# Patient Record
Sex: Female | Born: 1990 | Race: White | Hispanic: No | Marital: Married | Smoking: Current some day smoker
Health system: Southern US, Community
[De-identification: ages and names within clinical notes are randomized; demographics above are authoritative.]

## PROBLEM LIST (undated history)

## (undated) ENCOUNTER — Inpatient Hospital Stay (HOSPITAL_COMMUNITY): Payer: Self-pay

## (undated) DIAGNOSIS — A4902 Methicillin resistant Staphylococcus aureus infection, unspecified site: Secondary | ICD-10-CM

## (undated) DIAGNOSIS — F419 Anxiety disorder, unspecified: Secondary | ICD-10-CM

## (undated) DIAGNOSIS — K529 Noninfective gastroenteritis and colitis, unspecified: Secondary | ICD-10-CM

## (undated) DIAGNOSIS — F329 Major depressive disorder, single episode, unspecified: Secondary | ICD-10-CM

## (undated) DIAGNOSIS — K5090 Crohn's disease, unspecified, without complications: Secondary | ICD-10-CM

## (undated) DIAGNOSIS — G062 Extradural and subdural abscess, unspecified: Secondary | ICD-10-CM

## (undated) DIAGNOSIS — F1120 Opioid dependence, uncomplicated: Secondary | ICD-10-CM

## (undated) DIAGNOSIS — F32A Depression, unspecified: Secondary | ICD-10-CM

## (undated) DIAGNOSIS — R51 Headache: Secondary | ICD-10-CM

## (undated) HISTORY — DX: Anxiety disorder, unspecified: F41.9

## (undated) HISTORY — DX: Crohn's disease, unspecified, without complications: K50.90

## (undated) HISTORY — PX: HAND SURGERY: SHX662

## (undated) HISTORY — DX: Major depressive disorder, single episode, unspecified: F32.9

## (undated) HISTORY — PX: COLONOSCOPY: SHX174

## (undated) HISTORY — DX: Depression, unspecified: F32.A

## (undated) HISTORY — PX: TONSILLECTOMY: SUR1361

## (undated) HISTORY — DX: Noninfective gastroenteritis and colitis, unspecified: K52.9

---
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
## 2009-06-21 ENCOUNTER — Encounter (INDEPENDENT_AMBULATORY_CARE_PROVIDER_SITE_OTHER): Payer: Self-pay | Admitting: *Deleted

## 2009-06-29 ENCOUNTER — Telehealth: Payer: Self-pay | Admitting: Gastroenterology

## 2009-06-30 ENCOUNTER — Ambulatory Visit: Payer: Self-pay | Admitting: Internal Medicine

## 2009-06-30 DIAGNOSIS — F329 Major depressive disorder, single episode, unspecified: Secondary | ICD-10-CM | POA: Insufficient documentation

## 2009-06-30 DIAGNOSIS — F411 Generalized anxiety disorder: Secondary | ICD-10-CM | POA: Insufficient documentation

## 2009-07-15 ENCOUNTER — Telehealth (INDEPENDENT_AMBULATORY_CARE_PROVIDER_SITE_OTHER): Payer: Self-pay | Admitting: *Deleted

## 2009-08-09 ENCOUNTER — Encounter: Payer: Self-pay | Admitting: Physician Assistant

## 2010-05-26 NOTE — Miscellaneous (Signed)
Summary: RX Lorazepam  Clinical Lists Changes  Medications: Changed medication from ATIVAN 0.5 MG TABS (LORAZEPAM) take 1 tab twice daily to ATIVAN 0.5 MG TABS (LORAZEPAM) take 1 tab once daily - Signed Rx of ATIVAN 0.5 MG TABS (LORAZEPAM) take 1 tab once daily;  #30 x 1;  Signed;  Entered by: Lowry Ram NCMA;  Authorized by: Sammuel Cooper PA-c;  Method used: Printed then faxed to [facility] [phone]*, [address] [place], [place]  [address], Ph: [phone] or [phone], Fax: [phone]    Prescriptions: ATIVAN 0.5 MG TABS (LORAZEPAM) take 1 tab once daily  #30 x 1   Entered by:   Lowry Ram NCMA   Authorized by:   Sammuel Cooper PA-c   Signed by:   Lowry Ram NCMA on 08/09/2009   Method used:   Printed then faxed to ...       [facility] [address] (retail)       [address]       [place], [place]  [address]       Ph: [phone] or [phone]       Fax: [phone]   RxID:   [id number]

## 2010-05-26 NOTE — Letter (Signed)
Summary: New Patient letter  [facility]  [address] [place], [place] [address]   Phone: [phone]  Fax: [phone]       06/21/2009 MRN: [id number]  Lauren Allen [address] [place], [place]  [address]  Dear Lauren Allen,  Welcome to the Gastroenterology Division at [facility].    You are scheduled to see Dr.  Russella Dar on 07-19-09 at 9:30AM on the 3rd floor at [facility], [address]  We ask that you try to arrive at our office 15 minutes prior to your appointment time to allow for check-in.  We would like you to complete the enclosed self-administered evaluation form prior to your visit and bring it with you on the day of your appointment.  We will review it with you.  Also, please bring a complete list of all your medications or, if you prefer, bring the medication bottles and we will list them.  Please bring your insurance card so that we may make a copy of it.  If your insurance requires a referral to see a specialist, please bring your referral form from your primary care physician.  Co-payments are due at the time of your visit and may be paid by cash, check or credit card.     Your office visit will consist of a consult with your physician (includes a physical exam), any laboratory testing he/she may order, scheduling of any necessary diagnostic testing (e.g. x-ray, ultrasound, CT-scan), and scheduling of a procedure (e.g. Endoscopy, Colonoscopy) if required.  Please allow enough time on your schedule to allow for any/all of these possibilities.    If you cannot keep your appointment, please call [phone] to cancel or reschedule prior to your appointment date.  This allows [place] the opportunity to schedule an appointment for another patient in need of care.  If you do not cancel or reschedule by 5 p.m. the business day prior to your appointment date, you will be charged a $50.00 late cancellation/no-show fee.    Thank you for choosing  [facility] for your medical needs.  We appreciate the opportunity to care for you.  Please visit [place] at our website  to learn more about our practice.                     Sincerely,                                                             The Gastroenterology Division

## 2010-05-26 NOTE — Progress Notes (Signed)
Summary: Appt with Dr. Dawayne Cirri office  Phone Note Outgoing Call   Call placed by: Joselyn Glassman,  July 15, 2009 9:28 AM Call placed to: Specialist Summary of Call: Feliberto Harts, office manager at [facility], Dr. Dawayne Cirri office.  Need to know if pt made appt for herself as we recommended. Initial call taken by: Joselyn Glassman,  July 15, 2009 9:45 AM  Follow-up for Phone Call        Victorino Dike called me back and said the pt has not called yet for appointment.  Victorino Dike said she would be glad to call the pt's cell phone and try to make appt for her.   Follow-up by: Joselyn Glassman,  July 15, 2009 9:48 AM  Additional Follow-up for Phone Call Additional follow up Details #1::        Called Victorino Dike back and she said the pt told her she will call and make her appt when she gets her new work schedule soon. I also replyed to Dr. Marvell Fuller flag asking if this pt made an appt with that office. Sent myself a flag for 3-29 to call Victorino Dike back to see if thie pt made appt to see someone at that office. Additional Follow-up by: Joselyn Glassman,  July 15, 2009 1:58 PM

## 2010-05-26 NOTE — Progress Notes (Signed)
Summary: sooner appt.  Phone Note From Other Clinic   Caller: [facility]  [name]  [phone] Call For: Dr. Russella Dar Summary of Call: Pt has an appt. on 07-19-09 and wants a sooner appt. Has had GERD symptoms since Jan. and is continuing to constantly "belch" Initial call taken by: Karna Christmas,  June 29, 2009 2:50 PM  Follow-up for Phone Call         message left to call back   Teryl Lucy RN  June 29, 2009 3:50 PM  Patient  is rescheduled to today at 2:00.  [name] will notify the patient   Follow-up by: Darcey Nora RN, CGRN,  June 30, 2009 8:19 AM

## 2010-05-26 NOTE — Assessment & Plan Note (Signed)
Summary: BURPING ISSUES-EXCESSIVE/YF   History of Present Illness Visit Type: Initial Consult Primary GI MD: Stan Head MD [facility] Primary Provider: Benedetto Goad, MD Requesting Provider: Benedetto Goad, MD Chief Complaint: Excessive burping issue x 2 months- constant History of Present Illness:   20 Y.O. FEMALE NEW TO G.I. TODAY WITH HX OF CHRONIC ANXIETY,HEADACHES AND DEPRESSION. SHE IS ACCOMPANIED BY HER AUNT TODAY. SHE COMES IN WITH INTRACTABLE BELCHING,HAS BEEN HAVING SXS FOR SEVERAL MONTHS, WORSE OVER THE PAST 2 MONTHS.SHE BELCHES OFF AND ON ALL DAY, NOT NECCESSARILY BETTER OR WORSE WITH MEALS.NO DYSPAHAGIA, NO HEARTBURN, NO NAUSEA,VOMITING,NO WEIGHT LOSS,APPETITE OK. SHE HAS BEE GIVEN A TRIAL OF PRILOSEC TWICE DAILY X 3 WEEKS WITH NO CHANGE IN SXS. ALSO HAS HAD VALIUM 2 MG TWICE DAILY-DID CALM HER DOWN BUT DID NOT STOP BELCHING.HE AUNT RELATES  THAT SHE IS EXTREMELY ANXIOUS,HAS A BAD LIVING SITUATION,AND STRESSFUL WORK. PT IS LIVING WITH HER MOTHER,PAYING RENT-MOM IS BIPOLAR,VERY DEROGATORY TO PT,TREATS HER POORLY.   GI Review of Systems    Reports acid reflux, belching, bloating, nausea, vomiting, and  weight gain.      Denies abdominal pain, chest pain, dysphagia with liquids, dysphagia with solids, heartburn, loss of appetite, vomiting blood, and  weight loss.      Reports black tarry stools and  change in bowel habits.     Denies anal fissure, constipation, diarrhea, diverticulosis, fecal incontinence, heme positive stool, hemorrhoids, irritable bowel syndrome, jaundice, light color stool, liver problems, rectal bleeding, and  rectal pain. Preventive Screening-Counseling & Management  Alcohol-Tobacco     Smoking Status: current      Drug Use:  no.      Current Medications (verified): 1)  Gianvi 3-0.02 Mg Tabs (Drospirenone-Ethinyl Estradiol) .... Once Daily 2)  Citalopram Hydrobromide 20 Mg Tabs (Citalopram Hydrobromide) .... Take 1/2 Tablet By Mouth Once Daily  Allergies  (verified): 1)  ! Sulfa  Past History:  Past Medical History: Anxiety Disorder Chronic Headaches Depression  Past Surgical History: Tonsillectomy  Family History: Family History of Colon Cancer:GF Family History of Celiac Disease:Aunts Family History of Diabetes: GM Family History of Heart Disease:   Social History: Occupation:Hostess  Patient currently smokes.  Alcohol Use - no Daily Caffeine Use -3 Illicit Drug Use - no Smoking Status:  current Drug Use:  no  Review of Systems       The patient complains of anxiety-new, depression-new, headaches-new, menstrual pain, muscle pains/cramps, night sweats, and nosebleeds.  The patient denies allergy/sinus, anemia, arthritis/joint pain, back pain, blood in urine, breast changes/lumps, change in vision, confusion, cough, coughing up blood, fainting, fatigue, fever, hearing problems, heart murmur, heart rhythm changes, itching, pregnancy symptoms, shortness of breath, skin rash, sleeping problems, sore throat, swelling of feet/legs, swollen lymph glands, thirst - excessive , urination - excessive , urination changes/pain, urine leakage, vision changes, and voice change.         ROS OTHERWISE AS IN HPI  Vital Signs:  Patient profile:   20 year old female Height:      63 inches Weight:      181 pounds BMI:     32.18 Pulse rate:   80 / minute Pulse rhythm:   regular BP sitting:   116 / 80  (left arm) Cuff size:   regular  Vitals Entered By: June McMurray CMA Duncan Dull) (June 30, 2009 1:59 PM)  Physical Exam  General:  Well developed, well nourished, no acute distress. Head:  Normocephalic and atraumatic. Eyes:  PERRLA, no icterus.  Lungs:  Clear throughout to auscultation. Heart:  Regular rate and rhythm; no murmurs, rubs,  or bruits. Abdomen:  SOFT, NONTENDER, NO MASS OR HSM,BS+ Rectal:  NOT DONE Extremities:  No clubbing, cyanosis, edema or deformities noted. Neurologic:  Alert and  oriented x4;  grossly normal  neurologically. Psych:  Alert and cooperative. Normal mood and affect.anxious.     Impression & Recommendations:  Problem # 1:  REFRACTORY BELCHING Assessment New 20 YO WITH 2 MONTH HX OF REFRACTORY BELCHING,NO ALARM SIGNS-VERY LIKELY THIS IS PSYCHOGENIC SECONDARY TO UNDERLYING ANXIETY ISSUES  REVIWED TRIAL OF LOW GAS DIET,SLOW EATING STOP CARBONATED BEVERAGES INITATE SMOKIG CESSATION TRIAL OF ATIVAN 0.5 MG TWICE DAILY SCHEDULE APPT WITH BEHAVIORAL HEALTH/[place] FOR COUNSELLING. PT AND HER AUNT ARE RECEPTIVE TO THIS PLAN. FOLLOW UP WITH G.I. DR GESSNER AS NEEDED.  Patient Instructions: 1)  We have given you a perscription to take to your pharmacy. 2)  Excessive Gas Diet handout given.  3)  Drink no carbinated beverages. 4)  You can try Gas-X as needed. 5)  We have given you information  to call  for an appointment  with Behavioral Health for management of your anxiety. 6)  Copy sent to :  Benedetto Goad, MD 7)  The medication list was reviewed and reconciled.  All changed / newly prescribed medications were explained.  A complete medication list was provided to the patient / caregiver. Prescriptions: ATIVAN 0.5 MG TABS (LORAZEPAM) take 1 tab twice daily  #60 x 0   Entered by:   Lowry Ram NCMA   Authorized by:   Sammuel Cooper PA-c   Signed by:   Lowry Ram NCMA on 06/30/2009   Method used:   Printed then faxed to ...       [facility] [address] (retail)       [address]       [place], [place]  [address]       Ph: [phone] or [phone]       Fax: [phone]   RxID:   [id number]

## 2011-01-13 LAB — DIFFERENTIAL
Basophils Absolute: 0
Basophils Relative: 0
Eosinophils Absolute: 0.2
Eosinophils Relative: 2
Lymphocytes Relative: 28
Lymphs Abs: 2.2
Monocytes Absolute: 0.7
Monocytes Relative: 9
Neutro Abs: 4.8
Neutrophils Relative %: 61

## 2011-01-13 LAB — URINALYSIS, ROUTINE W REFLEX MICROSCOPIC
Bilirubin Urine: NEGATIVE
Glucose, UA: NEGATIVE
Hgb urine dipstick: NEGATIVE
Ketones, ur: NEGATIVE
Leukocytes, UA: NEGATIVE
Nitrite: NEGATIVE
Protein, ur: >300 — AB
Specific Gravity, Urine: 1.028
Urobilinogen, UA: 0.2
pH: 6.5

## 2011-01-13 LAB — CBC
HCT: 40.2
Hemoglobin: 14.1
MCHC: 35.1
MCV: 86
Platelets: 253
RBC: 4.67
RDW: 12.2
WBC: 8

## 2011-01-13 LAB — RAPID URINE DRUG SCREEN, HOSP PERFORMED
Amphetamines: POSITIVE — AB
Barbiturates: NOT DETECTED
Benzodiazepines: NOT DETECTED
Cocaine: NOT DETECTED
Opiates: NOT DETECTED
Tetrahydrocannabinol: NOT DETECTED

## 2011-01-13 LAB — BASIC METABOLIC PANEL
BUN: 16
CO2: 30
Calcium: 9.7
Chloride: 104
Creatinine, Ser: 0.72
Glucose, Bld: 100 — ABNORMAL HIGH
Potassium: 3.9
Sodium: 142

## 2011-01-13 LAB — URINE MICROSCOPIC-ADD ON

## 2011-01-13 LAB — PREGNANCY, URINE: Preg Test, Ur: NEGATIVE

## 2011-07-31 ENCOUNTER — Emergency Department (HOSPITAL_BASED_OUTPATIENT_CLINIC_OR_DEPARTMENT_OTHER)
Admission: EM | Admit: 2011-07-31 | Discharge: 2011-07-31 | Disposition: A | Discharge status: Home / Self Care | Payer: BC Managed Care – PPO | Attending: Emergency Medicine | Admitting: Emergency Medicine

## 2011-07-31 ENCOUNTER — Encounter (HOSPITAL_BASED_OUTPATIENT_CLINIC_OR_DEPARTMENT_OTHER): Payer: Self-pay | Admitting: *Deleted

## 2011-07-31 ENCOUNTER — Other Ambulatory Visit: Payer: Self-pay

## 2011-07-31 DIAGNOSIS — R1110 Vomiting, unspecified: Secondary | ICD-10-CM | POA: Insufficient documentation

## 2011-07-31 DIAGNOSIS — R55 Syncope and collapse: Secondary | ICD-10-CM

## 2011-07-31 DIAGNOSIS — N390 Urinary tract infection, site not specified: Secondary | ICD-10-CM | POA: Insufficient documentation

## 2011-07-31 DIAGNOSIS — D72829 Elevated white blood cell count, unspecified: Secondary | ICD-10-CM | POA: Insufficient documentation

## 2011-07-31 DIAGNOSIS — F17200 Nicotine dependence, unspecified, uncomplicated: Secondary | ICD-10-CM | POA: Insufficient documentation

## 2011-07-31 DIAGNOSIS — I498 Other specified cardiac arrhythmias: Secondary | ICD-10-CM | POA: Insufficient documentation

## 2011-07-31 DIAGNOSIS — R197 Diarrhea, unspecified: Secondary | ICD-10-CM | POA: Insufficient documentation

## 2011-07-31 HISTORY — DX: Headache: R51

## 2011-07-31 LAB — CBC
HCT: 38.1 % (ref 36.0–46.0)
Hemoglobin: 13.3 g/dL (ref 12.0–15.0)
MCH: 28.6 pg (ref 26.0–34.0)
MCHC: 34.9 g/dL (ref 30.0–36.0)
MCV: 81.9 fL (ref 78.0–100.0)
Platelets: 283 10*3/uL (ref 150–400)
RBC: 4.65 MIL/uL (ref 3.87–5.11)
RDW: 12.9 % (ref 11.5–15.5)
WBC: 14.2 10*3/uL — ABNORMAL HIGH (ref 4.0–10.5)

## 2011-07-31 LAB — DIFFERENTIAL
Basophils Absolute: 0 10*3/uL (ref 0.0–0.1)
Basophils Relative: 0 % (ref 0–1)
Eosinophils Absolute: 0.1 10*3/uL (ref 0.0–0.7)
Eosinophils Relative: 1 % (ref 0–5)
Lymphocytes Relative: 22 % (ref 12–46)
Lymphs Abs: 3.1 10*3/uL (ref 0.7–4.0)
Monocytes Absolute: 0.9 10*3/uL (ref 0.1–1.0)
Monocytes Relative: 7 % (ref 3–12)
Neutro Abs: 10.1 10*3/uL — ABNORMAL HIGH (ref 1.7–7.7)
Neutrophils Relative %: 71 % (ref 43–77)

## 2011-07-31 LAB — URINALYSIS, ROUTINE W REFLEX MICROSCOPIC
Bilirubin Urine: NEGATIVE
Glucose, UA: NEGATIVE mg/dL
Ketones, ur: NEGATIVE mg/dL
Nitrite: NEGATIVE
Protein, ur: NEGATIVE mg/dL
Specific Gravity, Urine: 1.02 (ref 1.005–1.030)
Urobilinogen, UA: 0.2 mg/dL (ref 0.0–1.0)
pH: 6 (ref 5.0–8.0)

## 2011-07-31 LAB — COMPREHENSIVE METABOLIC PANEL
ALT: 12 U/L (ref 0–35)
AST: 12 U/L (ref 0–37)
Albumin: 3.5 g/dL (ref 3.5–5.2)
Alkaline Phosphatase: 70 U/L (ref 39–117)
BUN: 13 mg/dL (ref 6–23)
CO2: 22 mEq/L (ref 19–32)
Calcium: 9.6 mg/dL (ref 8.4–10.5)
Chloride: 106 mEq/L (ref 96–112)
Creatinine, Ser: 0.7 mg/dL (ref 0.50–1.10)
GFR calc Af Amer: >90 mL/min (ref >90)
GFR calc non Af Amer: >90 mL/min (ref >90)
Glucose, Bld: 102 mg/dL — ABNORMAL HIGH (ref 70–99)
Potassium: 3.7 mEq/L (ref 3.5–5.1)
Sodium: 141 mEq/L (ref 135–145)
Total Bilirubin: 0.2 mg/dL — ABNORMAL LOW (ref 0.3–1.2)
Total Protein: 7.1 g/dL (ref 6.0–8.3)

## 2011-07-31 LAB — LIPASE, BLOOD: Lipase: 53 U/L (ref 11–59)

## 2011-07-31 LAB — URINE MICROSCOPIC-ADD ON

## 2011-07-31 LAB — PREGNANCY, URINE: Preg Test, Ur: NEGATIVE

## 2011-07-31 MED ORDER — SODIUM CHLORIDE 0.9 % IV SOLN
INTRAVENOUS | Status: DC
  Administered 2011-07-31: 10:00:00 via INTRAVENOUS

## 2011-07-31 MED ORDER — ONDANSETRON HCL 4 MG/2ML IJ SOLN
4.0000 mg | Freq: Once | INTRAMUSCULAR | Status: AC
  Administered 2011-07-31: 4 mg via INTRAVENOUS
  Filled 2011-07-31: qty 2

## 2011-07-31 MED ORDER — CIPROFLOXACIN HCL 500 MG PO TABS: ORAL_TABLET | ORAL | Status: DC

## 2011-07-31 NOTE — ED Notes (Signed)
Pt reports a syncopal episode yesterday.  She has dizziness, abdominal pain and vomited x 2 today.

## 2011-07-31 NOTE — ED Provider Notes (Signed)
History     CSN: [id number]  Arrival date & time 07/31/11  0102   First MD Initiated Contact with Patient 07/31/11 [phone]      Chief Complaint  Patient presents with  . Dizziness  . Near Syncope  . Abdominal Pain  . Emesis    (Consider location/radiation/quality/duration/timing/severity/associated sxs/prior treatment) HPI Comments: Patient is a 21 year old woman who said she was cooking yesterday with her mother, became lightheaded and dizzy. She went outside for a breath of fresh air. She came back inside, and felt faint and passed out. She woke up and was dizzy and scared. She did not seek medical evaluation at that time. Last night her stomach started to hurt. This morning she was very dizzy and had vomiting. She says she could hardly stand up. Therefore seeks out evaluation.  Patient is a 21 y.o. female presenting with syncope and vomiting. The history is provided by the patient.  Loss of Consciousness This is a new problem. The current episode started yesterday. The problem occurs rarely. The problem has been resolved. Pertinent negatives include no chest pain, no abdominal pain, no headaches and no shortness of breath. The symptoms are aggravated by nothing. The symptoms are relieved by nothing. She has tried nothing for the symptoms.  Emesis  This is a new problem. The current episode started 6 to 12 hours ago. The problem occurs 5 to 10 times per day. The problem has not changed since onset.The emesis has an appearance of stomach contents. There has been no fever. Fever duration: She says she felt really hot just before fainting yesterday. Pertinent negatives include no abdominal pain, no diarrhea and no headaches.    Past Medical History  Diagnosis Date  . Headache     Past Surgical History  Procedure Date  . Tonsillectomy     No family history on file.  History  Substance Use Topics  . Smoking status: Current Everyday Smoker -- 0.5 packs/day  . Smokeless tobacco: Not  on file  . Alcohol Use: Yes     occasional    OB History    Grav Para Term Preterm Abortions TAB SAB Ect Mult Living                  Review of Systems  Constitutional: Negative.   HENT: Negative.   Eyes: Negative.   Respiratory: Negative.  Negative for shortness of breath.   Cardiovascular: Positive for syncope. Negative for chest pain.  Gastrointestinal: Positive for nausea and vomiting. Negative for abdominal pain and diarrhea.  Genitourinary: Negative.   Musculoskeletal: Negative.   Skin: Negative.   Neurological: Negative.  Negative for headaches.  Psychiatric/Behavioral: Negative.     Allergies  Sulfonamide derivatives  Home Medications  No current outpatient prescriptions on file.  BP 125/87  Pulse 108  Temp(Src) 98 F (36.7 C) (Oral)  Resp 20  Ht 5\' 3"  (1.6 m)  Wt 190 lb (86.183 kg)  BMI 33.66 kg/m2  SpO2 98%  LMP 07/17/2011  Physical Exam  Nursing note and vitals reviewed. Constitutional: She is oriented to person, place, and time. She appears well-developed and well-nourished. No distress.  HENT:  Head: Normocephalic and atraumatic.  Right Ear: External ear normal.  Left Ear: External ear normal.  Mouth/Throat: Oropharynx is clear and moist.  Neck: Normal range of motion. Neck supple.  Cardiovascular: Normal rate and regular rhythm.   Pulmonary/Chest: Effort normal and breath sounds normal.  Abdominal: Soft. Bowel sounds are normal.  Musculoskeletal: Normal range of  motion. She exhibits no edema and no tenderness.  Lymphadenopathy:    She has no cervical adenopathy.  Neurological: She is alert and oriented to person, place, and time.       No sensory or motor deficit.  Skin: Skin is warm and dry.  Psychiatric: She has a normal mood and affect. Her behavior is normal.    ED Course  Procedures (including critical care time)  9:29 AM  Date: 07/31/2011  Rate: 86  Rhythm: normal sinus rhythm and sinus arrhythmia  QRS Axis: normal  Intervals:  normal  ST/T Wave abnormalities: normal  Conduction Disutrbances:none  Narrative Interpretation: Normal EKG  Old EKG Reviewed: none available    Labs Reviewed  PREGNANCY, URINE  URINALYSIS, ROUTINE W REFLEX MICROSCOPIC   9:16 AM Pt was seen and had physical examination. IV fluids ordered.  IV Zofran ordered.  Lab workup for syncope and GI symptoms ordered.   11:02 AM Results for orders placed during the hospital encounter of 07/31/11  PREGNANCY, URINE      Component Value Range   Preg Test, Ur NEGATIVE  NEGATIVE   URINALYSIS, ROUTINE W REFLEX MICROSCOPIC      Component Value Range   Color, Urine YELLOW  YELLOW    APPearance CLOUDY (*) CLEAR    Specific Gravity, Urine 1.020  1.005 - 1.030    pH 6.0  5.0 - 8.0    Glucose, UA NEGATIVE  NEGATIVE (mg/dL)   Hgb urine dipstick SMALL (*) NEGATIVE    Bilirubin Urine NEGATIVE  NEGATIVE    Ketones, ur NEGATIVE  NEGATIVE (mg/dL)   Protein, ur NEGATIVE  NEGATIVE (mg/dL)   Urobilinogen, UA 0.2  0.0 - 1.0 (mg/dL)   Nitrite NEGATIVE  NEGATIVE    Leukocytes, UA SMALL (*) NEGATIVE   CBC      Component Value Range   WBC 14.2 (*) 4.0 - 10.5 (K/uL)   RBC 4.65  3.87 - 5.11 (MIL/uL)   Hemoglobin 13.3  12.0 - 15.0 (g/dL)   HCT 16.1  09.6 - 04.5 (%)   MCV 81.9  78.0 - 100.0 (fL)   MCH 28.6  26.0 - 34.0 (pg)   MCHC 34.9  30.0 - 36.0 (g/dL)   RDW 40.9  81.1 - 91.4 (%)   Platelets 283  150 - 400 (K/uL)  DIFFERENTIAL      Component Value Range   Neutrophils Relative 71  43 - 77 (%)   Neutro Abs 10.1 (*) 1.7 - 7.7 (K/uL)   Lymphocytes Relative 22  12 - 46 (%)   Lymphs Abs 3.1  0.7 - 4.0 (K/uL)   Monocytes Relative 7  3 - 12 (%)   Monocytes Absolute 0.9  0.1 - 1.0 (K/uL)   Eosinophils Relative 1  0 - 5 (%)   Eosinophils Absolute 0.1  0.0 - 0.7 (K/uL)   Basophils Relative 0  0 - 1 (%)   Basophils Absolute 0.0  0.0 - 0.1 (K/uL)  COMPREHENSIVE METABOLIC PANEL      Component Value Range   Sodium 141  135 - 145 (mEq/L)   Potassium 3.7  3.5 -  5.1 (mEq/L)   Chloride 106  96 - 112 (mEq/L)   CO2 22  19 - 32 (mEq/L)   Glucose, Bld 102 (*) 70 - 99 (mg/dL)   BUN 13  6 - 23 (mg/dL)   Creatinine, Ser 7.82  0.50 - 1.10 (mg/dL)   Calcium 9.6  8.4 - 95.6 (mg/dL)   Total Protein 7.1  6.0 - 8.3 (g/dL)   Albumin 3.5  3.5 - 5.2 (g/dL)   AST 12  0 - 37 (U/L)   ALT 12  0 - 35 (U/L)   Alkaline Phosphatase 70  39 - 117 (U/L)   Total Bilirubin 0.2 (*) 0.3 - 1.2 (mg/dL)   GFR calc non Af Amer >90  >90 (mL/min)   GFR calc Af Amer >90  >90 (mL/min)  LIPASE, BLOOD      Component Value Range   Lipase 53  11 - 59 (U/L)  URINE MICROSCOPIC-ADD ON      Component Value Range   Squamous Epithelial / LPF MANY (*) RARE    WBC, UA 7-10  <3 (WBC/hpf)   RBC / HPF 0-2  <3 (RBC/hpf)   Bacteria, UA MANY (*) RARE    11:02 AM Lab results show a UTI, and WBC elevated at 14,200.  Will Rx with Cipro 500 mg bid.   1. Urinary tract infection   2. Vomiting and diarrhea   3. Vasovagal syncope         Carleene Cooper III, MD 07/31/11 1110

## 2011-07-31 NOTE — ED Notes (Signed)
120/74 was lying BP; not sitting

## 2011-07-31 NOTE — ED Notes (Signed)
MD at bedside. 

## 2011-07-31 NOTE — Discharge Instructions (Signed)
Lauren Allen, you had physical examination, laboratory tests, and EKG to check on you after you fainted yesterday and had dizziness, nausea and vomiting today.  You were given intravenous fluids and medicine for nausea.  Your tests show that you have a urinary infection.  Take the medicine Cipro 500 mg twice a day for 3 days to treat the urinary infection.  Rest at home today, and drink plenty of liquids.

## 2011-10-13 ENCOUNTER — Telehealth: Payer: Self-pay | Admitting: Gastroenterology

## 2011-10-16 NOTE — Telephone Encounter (Signed)
Patient rescheduled to see Dr Russella Dar 10/17/11 2:15.  She is aware

## 2011-10-17 ENCOUNTER — Other Ambulatory Visit (INDEPENDENT_AMBULATORY_CARE_PROVIDER_SITE_OTHER): Payer: BC Managed Care – PPO

## 2011-10-17 ENCOUNTER — Ambulatory Visit (INDEPENDENT_AMBULATORY_CARE_PROVIDER_SITE_OTHER): Payer: BC Managed Care – PPO | Admitting: Gastroenterology

## 2011-10-17 ENCOUNTER — Encounter: Payer: Self-pay | Admitting: Gastroenterology

## 2011-10-17 VITALS — BP 118/72 | HR 67 | Ht 63.0 in | Wt 178.4 lb

## 2011-10-17 DIAGNOSIS — R933 Abnormal findings on diagnostic imaging of other parts of digestive tract: Secondary | ICD-10-CM

## 2011-10-17 DIAGNOSIS — R1084 Generalized abdominal pain: Secondary | ICD-10-CM

## 2011-10-17 LAB — CBC WITH DIFFERENTIAL/PLATELET
Basophils Relative: 0.9 % (ref 0.0–3.0)
Eosinophils Absolute: 0.1 10*3/uL (ref 0.0–0.7)
Eosinophils Relative: 1.4 % (ref 0.0–5.0)
HCT: 39.5 % (ref 36.0–46.0)
Hemoglobin: 13.4 g/dL (ref 12.0–15.0)
Lymphs Abs: 2.2 10*3/uL (ref 0.7–4.0)
MCHC: 33.9 g/dL (ref 30.0–36.0)
MCV: 85.3 fl (ref 78.0–100.0)
Monocytes Absolute: 0.6 10*3/uL (ref 0.1–1.0)
Neutro Abs: 7.2 10*3/uL (ref 1.4–7.7)
Neutrophils Relative %: 70.1 % (ref 43.0–77.0)
RBC: 4.63 Mil/uL (ref 3.87–5.11)
WBC: 10.3 10*3/uL (ref 4.5–10.5)

## 2011-10-17 LAB — COMPREHENSIVE METABOLIC PANEL
ALT: 13 U/L (ref 0–35)
CO2: 28 mEq/L (ref 19–32)
Calcium: 10 mg/dL (ref 8.4–10.5)
Chloride: 102 mEq/L (ref 96–112)
GFR: 59.93 mL/min — ABNORMAL LOW (ref >60.00)
Potassium: 4.7 mEq/L (ref 3.5–5.1)
Total Protein: 7.3 g/dL (ref 6.0–8.3)

## 2011-10-17 MED ORDER — BUDESONIDE 3 MG PO CP24: 9.0000 mg | ORAL_CAPSULE | ORAL | Status: DC

## 2011-10-17 MED ORDER — MOVIPREP 100 G PO SOLR: 1.0000 | Freq: Once | ORAL | Status: DC

## 2011-10-17 NOTE — Progress Notes (Signed)
History of Present Illness: This is a 21 year old female here today with her aunt. She relates a 3 week history of intermittent, generalized abdominal pain and diffuse back pains. She has had frequent diarrhea with occasional constipation as well. She relates urinary tract infections over the past several months. She was evaluated by Arn Medal, [facility] who ordered a CT scan of the abdomen/pevlis which was performed on June 12 showing a long segment of distal ileal wall thickening associated with small to moderately enlarged ileocolonic lymph nodes. Constipation was also noted. She was treated for possible infectious ileitis with antibiotics. The office note states he was treated with Cipro and Flagyl for 10 days however the patient states she was treated with Cipro and minocycline. Her symptoms have not improved. She notes a decreased appetite and a 20 pound weight loss over the past several weeks. Patient relates her grandmother's cousin had Crohn's disease. Denies change in stool caliber, melena, hematochezia, nausea, vomiting, dysphagia, reflux symptoms, chest pain.  Allergies  Allergen Reactions  . Sulfonamide Derivatives    Outpatient Prescriptions Prior to Visit  Medication Sig Dispense Refill  . ciprofloxacin (CIPRO) 500 MG tablet Take Cipro 500 mg twice a day for 3 days for UTI.  6 tablet  0   Past Medical History  Diagnosis Date  . Headache   . Anxiety   . Depression    Past Surgical History  Procedure Date  . Tonsillectomy    History   Social History  . Marital Status: Single    Spouse Name: N/A    Number of Children: N/A  . Years of Education: N/A   Social History Main Topics  . Smoking status: Former Smoker -- 0.5 packs/day  . Smokeless tobacco: Never Used  . Alcohol Use: Yes     occasional  . Drug Use: None  . Sexually Active: Yes    Birth Control/ Protection: Pill   Other Topics Concern  . None   Social History Narrative   Has one coke a day   Family History    Problem Relation Age of Onset  . Colon cancer Maternal Grandfather   . Celiac disease Maternal Aunt   . Diabetes Maternal Grandmother   . Heart disease     Review of Systems: Pertinent positive and negative review of systems were noted in the above HPI section. All other review of systems were otherwise negative.  Physical Exam: General: Well developed , well nourished, no acute distress Head: Normocephalic and atraumatic Eyes:  sclerae anicteric, EOMI Ears: Normal auditory acuity Mouth: No deformity or lesions Neck: Supple, no masses or thyromegaly Lungs: Clear throughout to auscultation Heart: Regular rate and rhythm; no murmurs, rubs or bruits Abdomen: Soft, mild diffuse tenderness, slightly increased in the lower abdomen, without rebound or guarding and non distended. No masses, hepatosplenomegaly or hernias noted. Normal Bowel sounds Musculoskeletal: Symmetrical with no gross deformities  Skin: No lesions on visible extremities Pulses:  Normal pulses noted Extremities: No clubbing, cyanosis, edema or deformities noted Neurological: Alert oriented x 4, grossly nonfocal Cervical Nodes:  No significant cervical adenopathy Inguinal Nodes: No significant inguinal adenopathy Psychological:  Alert and cooperative. Normal mood and affect  Assessment and Recommendations:  1. Generalized abdominal pain, back pain, weight loss and abnormal ileum on CT scan. Strongly suspect this is Crohn's ileitis. Obtain blood work and stool Hemoccults. Begin budesonide 9 mg daily. Schedule colonoscopy. The risks, benefits, and alternatives to colonoscopy with possible biopsy and possible polypectomy were discussed with the patient  and they consent to proceed. I spent about 15-20 minutes counseling the patient answering questions about Crohn's disease: the diagnosis, management and expected course.

## 2011-10-17 NOTE — Patient Instructions (Addendum)
Your physician has requested that you go to the basement for the following lab work before leaving today:CBC, Cmet, Sed rate, CRP, TSH. Follow instructions on Hemoccult cards and mail them back to [place] when finished. We have sent the following medications to your pharmacy for you to pick up at your convenience:Budesonide. You have been scheduled for a colonoscopy with propofol. Please follow written instructions given to you at your visit today.  Please pick up your prep kit at the pharmacy within the next 1-3 days. Stay on a low-residue diet and drink plenty of fluids.   Low Fiber and Residue Restricted Diet A low fiber diet restricts foods that contain carbohydrates that are not digested in the small intestine. A diet containing about 10 g of fiber is considered low fiber. The diet needs to be individualized to suit patient tolerances and preferences and to avoid unnecessary restrictions. Generally, the foods emphasized in a low fiber diet have no skins or seeds. They may have been processed to remove bran, germ, or husks. Cooking may not necessarily eliminate the fiber. Cooking may, in fact, enable a greater quantity of fiber to be consumed in a lesser volume. Legumes and nuts are also restricted. The term low residue has also been used to describe low fiber diets, although the two are not the same. Residue refers to any substance that adds to bowel (colonic) contents, such as sloughed cells and intestinal bacteria, in addition to fiber. Residue-containing foods, prunes and prune juice, milk, and connective tissue from meats may also need to be eliminated. It is important to eliminate these foods during sudden (acute) attacks of inflammatory bowel disease, when there is a partial obstruction due to another reason, or when minimal fecal output is desired. When these problems are gone, a more normal diet may be used. PURPOSE  Prevent blockage of a partially obstructed or narrowed gastrointestinal tract.     Reduce stool weight and volume.   Slow the movement of waste.  WHEN IS THIS DIET USED?  Acute phase of Crohn's disease, ulcerative colitis, regional enteritis, or diverticulitis.   Narrowing (stenosis) of intestinal or esophageal tubes (lumina).   Transitional diet following surgery, injury (trauma), or illness.  ADEQUACY This diet is nutritionally adequate based on individual food choices according to the Recommended Dietary Allowances of the [employer]. CHOOSING FOODS Check labels, especially on foods from the starch list. Often, dietary fiber content is listed with the Nutrition Facts panel.  Breads and Starches  Allowed: White, [place], and pita breads, plain rolls, buns, or sweet rolls, doughnuts, waffles, pancakes, bagels. Plain muffins, sweet breads, biscuits, matzoth. Flour. Soda, saltine, or graham crackers. Pretzels, rusks, melba toast, zwieback. Cooked cereals: cornmeal, farina, cream cereals. Dry cereals: refined corn, wheat, rice, and oat cereals (check label). Potatoes prepared any way without skins, refined macaroni, spaghetti, noodles, refined rice.   Avoid: Bread, rolls, or crackers made with whole-wheat, multigrains, rye, bran seeds, nuts, or coconut. Corn tortillas, table-shells. Corn chips, tortilla chips. Cereals containing whole-grains, multigrains, bran, coconut, nuts, or raisins. Cooked or dry oatmeal. Coarse wheat cereals, granola. Cereals advertised as "high fiber." Potato skins. Whole-grain pasta, wild or brown rice. Popcorn.  Vegetables  Allowed:  Strained tomato and vegetable juices. Fresh: tender lettuce, cucumber, cabbage, spinach, bean sprouts. Cooked, canned: asparagus, bean sprouts, cut green or wax beans, cauliflower, pumpkin, beets, mushrooms, olives, spinach, yellow squash, tomato, tomato sauce (no seeds), zucchini (peeled), turnips. Canned sweet potatoes. Small amounts of celery, onion, radish, and green pepper  may be used. Keep servings  limited to  cup.   Avoid: Fresh, cooked, or canned: artichokes, baked beans, beet greens, broccoli, Brussels sprouts, French-style green beans, corn, kale, legumes, peas, sweet potatoes. Cooked: green or red cabbage, spinach. Avoid large servings of any vegetables.  Fruit  Allowed:  All fruit juices except prune juice. Cooked or canned: apricots applesauce, cantaloupe, cherries, grapefruit, grapes, kiwi, mandarin oranges, peaches, pears, fruit cocktail, pineapple, plums, watermelon. Fresh: banana, grapes, cantaloupe, avocado, cherries, pineapple, grapefruit, kiwi, nectarines, peaches, oranges, blueberries, plums. Keep servings limited to  cup or 1 piece.   Avoid: Fresh: apple with or without skin, apricots, mango, pears, raspberries, strawberries. Prune juice, stewed or dried prunes. Dried fruits, raisins, dates. Avoid large servings of all fresh fruits.  Meat and Meat Substitutes  Allowed:  Ground or well-cooked tender beef, ham, veal, lamb, pork, or poultry. Eggs, plain cheese. Fish, oysters, shrimp, lobster, other seafood. Liver, organ meats.   Avoid: Tough, fibrous meats with gristle. Peanut butter, smooth or chunky. Cheese with seeds, nuts, or other foods not allowed. Nuts, seeds, legumes, dried peas, beans, lentils.  Milk  Allowed:  All milk products except those not allowed. Milk and milk product consumption should be minimal when low residue is desired.   Avoid: Yogurt that contains nuts or seeds.  Soups and Combination Foods  Allowed:  Bouillon, broth, or cream soups made from allowed foods. Any strained soup. Casseroles or mixed dishes made with allowed foods.   Avoid: Soups made from vegetables that are not allowed or that contain other foods not allowed.  Desserts and Sweets  Allowed:  Plain cakes and cookies, pie made with allowed fruit, pudding, custard, cream pie. Gelatin, fruit, ice, sherbet, frozen ice pops. Ice cream, ice milk without nuts. Plain hard candy, honey, jelly,  molasses, syrup, sugar, chocolate syrup, gumdrops, marshmallows.   Avoid: Desserts, cookies, or candies that contain nuts, peanut butter, or dried fruits. Jams, preserves with seeds, marmalade.  Fats and Oils  Allowed:  Margarine, butter, cream, mayonnaise, salad oils, plain salad dressings made from allowed foods. Plain gravy, crisp bacon without rind.   Avoid: Seeds, nuts, olives. Avocados.  Beverages  Allowed:  All, except those listed to avoid.   Avoid: Fruit juices with high pulp, prune juice.  Condiments  Allowed:  Ketchup, mustard, horseradish, vinegar, cream sauce, cheese sauce, cocoa powder. Spices in moderation: allspice, basil, bay leaves, celery powder or leaves, cinnamon, cumin powder, curry powder, ginger, mace, marjoram, onion or garlic powder, oregano, paprika, parsley flakes, ground pepper, rosemary, sage, savory, tarragon, thyme, turmeric.   Avoid: Coconut, pickles.  SAMPLE MEAL PLAN The following menu is provided as a sample. Your daily menu plans will vary. Be sure to include a minimum of the following each day in order to provide essential nutrients for the adult:  Starch/Bread/Cereal Group, 6 servings.   Fruit/Vegetable Group, 5 servings.   Meat/Meat Substitute Group, 2 servings.   Milk/Milk Substitute Group, 2 servings.  A serving is equal to  cup for fruits, vegetables, and cooked cereals or 1 piece for foods such as a piece of bread, 1 orange, or 1 apple. For dry cereals and crackers, use serving sizes listed on the label. Combination foods may count as full or partial servings from various food groups. Fats, desserts, and sweets may be added to the meal plan after the requirements for essential nutrients are met. SAMPLE MENU Breakfast   cup orange juice.   1 boiled egg.   1 slice white  toast.   Margarine.    cup cornflakes.   1 cup milk.   Beverage.  Lunch   cup chicken noodle soup.   2 to 3 oz sliced roast beef.   2 slices seedless rye  bread.   Mayonnaise.    cup tomato juice.   1 small banana.   Beverage.  Dinner  3 oz baked chicken.    cup scalloped potatoes.    cup cooked beets.   White dinner roll.   Margarine.    cup canned peaches.   Beverage.  Document Released: 09/30/2001 Document Revised: 03/30/2011 Document Reviewed: 03/13/2011 [facility] Patient Information 2012 [place], [place].  cc: Arn Medal, [facility]

## 2011-10-18 ENCOUNTER — Encounter: Payer: Self-pay | Admitting: Gastroenterology

## 2011-10-27 ENCOUNTER — Other Ambulatory Visit (INDEPENDENT_AMBULATORY_CARE_PROVIDER_SITE_OTHER): Payer: BC Managed Care – PPO

## 2011-10-27 DIAGNOSIS — R933 Abnormal findings on diagnostic imaging of other parts of digestive tract: Secondary | ICD-10-CM

## 2011-10-27 LAB — HEMOCCULT SLIDES (X 3 CARDS)
Fecal Occult Blood: NEGATIVE
OCCULT 2: NEGATIVE
OCCULT 5: NEGATIVE

## 2011-11-08 ENCOUNTER — Ambulatory Visit: Payer: BC Managed Care – PPO | Admitting: Gastroenterology

## 2011-12-01 ENCOUNTER — Ambulatory Visit (AMBULATORY_SURGERY_CENTER): Payer: BC Managed Care – PPO | Admitting: Gastroenterology

## 2011-12-01 ENCOUNTER — Encounter: Payer: Self-pay | Admitting: Gastroenterology

## 2011-12-01 VITALS — BP 119/68 | HR 87 | Temp 96.0°F | Resp 18 | Ht 63.0 in | Wt 190.0 lb

## 2011-12-01 DIAGNOSIS — R634 Abnormal weight loss: Secondary | ICD-10-CM

## 2011-12-01 DIAGNOSIS — K5289 Other specified noninfective gastroenteritis and colitis: Secondary | ICD-10-CM

## 2011-12-01 DIAGNOSIS — R933 Abnormal findings on diagnostic imaging of other parts of digestive tract: Secondary | ICD-10-CM

## 2011-12-01 DIAGNOSIS — R109 Unspecified abdominal pain: Secondary | ICD-10-CM

## 2011-12-01 MED ORDER — SODIUM CHLORIDE 0.9 % IV SOLN: 500.0000 mL | INTRAVENOUS | Status: DC

## 2011-12-01 NOTE — Patient Instructions (Addendum)

## 2011-12-01 NOTE — Progress Notes (Signed)
Patient did not experience any of the following events: a burn prior to discharge; a fall within the facility; wrong site/side/patient/procedure/implant event; or a hospital transfer or hospital admission upon discharge from the facility. (G8907) Patient did not have preoperative order for IV antibiotic SSI prophylaxis. (G8918)  

## 2011-12-01 NOTE — Op Note (Signed)
[facility] [address] [place], [place]  [address]  COLONOSCOPY PROCEDURE REPORT  PATIENT:  Lauren Allen, Lauren Allen  MR#:  [id number] BIRTHDATE:  [birth year], 20 yrs. old  GENDER:  female ENDOSCOPIST:  Judie Petit T. Russella Dar, MD, [facility]  PROCEDURE DATE:  12/01/2011 PROCEDURE:  Colonoscopy with biopsy ASA CLASS:  Class I INDICATIONS:  1) abnormal CT of abdomen/pelvis  2) abdominal pain 3) weight loss MEDICATIONS:   MAC sedation, administered by CRNA, propofol (Diprivan) 300 mg IV DESCRIPTION OF PROCEDURE:   After the risks benefits and alternatives of the procedure were thoroughly explained, informed consent was obtained.  Digital rectal exam was performed and revealed no abnormalities.   The LB CF-[id number] endoscope was introduced through the anus and advanced to the terminal ileum which was intubated for a short distance, without limitations. The quality of the prep was excellent, using MoviPrep.  The instrument was then slowly withdrawn as the colon was fully examined. <<PROCEDUREIMAGES>>           FINDINGS:  Mucosal abnormality was found terminal ileum-possible erosions and minimal erythema. Multiple biopsies were obtained and sent to pathology.  A normal appearing cecum, ileocecal valve, and appendiceal orifice were identified. The ascending, hepatic flexure, transverse, splenic flexure, descending, sigmoid colon, and rectum appeared unremarkable. Retroflexed views in the rectum revealed no abnormalities. The time to cecum =  2  minutes. The scope was then withdrawn (time =  9.25  min) from the patient and the procedure completed.  COMPLICATIONS:  None  ENDOSCOPIC IMPRESSION: 1) Mucosal abnormality in the terminal ileum 2) Normal colon  RECOMMENDATIONS: 1) Await pathology results 2) Call to schedule a follow-up appointment with GI Clinic 1 month  Carlyn Mullenbach T. Russella Dar, MD, [facility]  n. eSIGNED:   Venita Lick. Shelitha Magley at 12/01/2011 10:23 AM  [name],  [phone]

## 2011-12-04 ENCOUNTER — Telehealth: Payer: Self-pay | Admitting: *Deleted

## 2011-12-04 NOTE — Telephone Encounter (Signed)
  Follow up Call-  Call back number 12/01/2011  Post procedure Call Back phone  # [phone]  Permission to leave phone message Yes    No answer and answering machine did not pick up so no message left

## 2011-12-07 ENCOUNTER — Encounter: Payer: Self-pay | Admitting: Gastroenterology

## 2012-01-08 ENCOUNTER — Ambulatory Visit (INDEPENDENT_AMBULATORY_CARE_PROVIDER_SITE_OTHER): Payer: BC Managed Care – PPO | Admitting: Gastroenterology

## 2012-01-08 ENCOUNTER — Encounter: Payer: Self-pay | Admitting: Gastroenterology

## 2012-01-08 VITALS — BP 120/72 | HR 88 | Ht 63.0 in | Wt 185.0 lb

## 2012-01-08 DIAGNOSIS — K5000 Crohn's disease of small intestine without complications: Secondary | ICD-10-CM

## 2012-01-08 MED ORDER — BUDESONIDE 3 MG PO CP24: 9.0000 mg | ORAL_CAPSULE | ORAL | Status: AC

## 2012-01-08 NOTE — Progress Notes (Signed)
History of Present Illness: This is a 21 year old female who has ileitis as evidenced by an abnormal CT scan of the ileum, ileal abnormalities at colonoscopy and ileitis on biopsy. Ileitis could not be definitively diagnosed as Crohn's disease on pathology. Her abdominal pain has resolved on budesonide. She was not previously having diarrhea but now is having 2-4 loose, nonbloody stools each day. She denies any recent antibiotic usage.  Current Medications, Allergies, Past Medical History, Past Surgical History, Family History and Social History were reviewed in [employer] record.  Physical Exam: General: Well developed , well nourished, no acute distress Head: Normocephalic and atraumatic Eyes:  sclerae anicteric, EOMI Ears: Normal auditory acuity Mouth: No deformity or lesions Lungs: Clear throughout to auscultation Heart: Regular rate and rhythm; no murmurs, rubs or bruits Abdomen: Soft, non tender and non distended. No masses, hepatosplenomegaly or hernias noted. Normal Bowel sounds Musculoskeletal: Symmetrical with no gross deformities  Pulses:  Normal pulses noted Extremities: No clubbing, cyanosis, edema or deformities noted Neurological: Alert oriented x 4, grossly nonfocal Psychological:  Alert and cooperative. Normal mood and affect  Assessment and Recommendations:  1. Ileitis with typical strongly suggestive of Crohn's ileitis. Her abdominal pain has improved substantially on budesonide and she notes mild diarrhea was not previously present. Continue budesonide. Advised her to minimize caffeine and high fat foods and to begin a trial of lactose avoidance. She'll contact us if her diarrhea does not improve, otherwise plan for a three-month followup.

## 2012-01-08 NOTE — Patient Instructions (Addendum)
We have sent the following medications to your pharmacy for you to pick up at your convenience: Entocort. Minimize your Caffeine use and avoid Milk and high fat foods.  cc: Mady Gemma, MD

## 2012-08-29 ENCOUNTER — Telehealth: Payer: Self-pay | Admitting: Gastroenterology

## 2012-08-29 NOTE — Telephone Encounter (Signed)
Left message for pt to call back  °

## 2012-08-29 NOTE — Telephone Encounter (Signed)
Pt states she has been having nausea and diarrhea along with abdominal pain for 2 days. Pt made appt to see Dr. Russella Dar but it is out a bit. Offered pt an appt with NP. States she will check her schedule and call back regarding an appt.

## 2012-09-11 ENCOUNTER — Ambulatory Visit: Payer: BC Managed Care – PPO | Admitting: Gastroenterology

## 2013-05-19 ENCOUNTER — Other Ambulatory Visit (INDEPENDENT_AMBULATORY_CARE_PROVIDER_SITE_OTHER): Payer: BC Managed Care – PPO

## 2013-05-19 ENCOUNTER — Ambulatory Visit (INDEPENDENT_AMBULATORY_CARE_PROVIDER_SITE_OTHER): Payer: BC Managed Care – PPO | Admitting: Gastroenterology

## 2013-05-19 ENCOUNTER — Encounter: Payer: Self-pay | Admitting: Gastroenterology

## 2013-05-19 VITALS — BP 108/82 | HR 92 | Ht 63.5 in | Wt 183.0 lb

## 2013-05-19 DIAGNOSIS — K219 Gastro-esophageal reflux disease without esophagitis: Secondary | ICD-10-CM

## 2013-05-19 DIAGNOSIS — R1013 Epigastric pain: Secondary | ICD-10-CM

## 2013-05-19 DIAGNOSIS — R198 Other specified symptoms and signs involving the digestive system and abdomen: Secondary | ICD-10-CM

## 2013-05-19 LAB — CBC WITH DIFFERENTIAL/PLATELET
Basophils Absolute: 0.1 10*3/uL (ref 0.0–0.1)
Basophils Relative: 0.8 % (ref 0.0–3.0)
EOS ABS: 0.1 10*3/uL (ref 0.0–0.7)
Eosinophils Relative: 1.2 % (ref 0.0–5.0)
HCT: 39.9 % (ref 36.0–46.0)
Hemoglobin: 13.5 g/dL (ref 12.0–15.0)
Lymphocytes Relative: 26.5 % (ref 12.0–46.0)
Lymphs Abs: 2.6 10*3/uL (ref 0.7–4.0)
MCHC: 33.8 g/dL (ref 30.0–36.0)
MCV: 84.9 fl (ref 78.0–100.0)
Monocytes Absolute: 0.5 10*3/uL (ref 0.1–1.0)
Monocytes Relative: 5.2 % (ref 3.0–12.0)
Neutro Abs: 6.5 10*3/uL (ref 1.4–7.7)
Neutrophils Relative %: 66.3 % (ref 43.0–77.0)
PLATELETS: 264 10*3/uL (ref 150.0–400.0)
RBC: 4.7 Mil/uL (ref 3.87–5.11)
RDW: 12.3 % (ref 11.5–14.6)
WBC: 9.8 10*3/uL (ref 4.5–10.5)

## 2013-05-19 LAB — BASIC METABOLIC PANEL
BUN: 12 mg/dL (ref 6–23)
CALCIUM: 9 mg/dL (ref 8.4–10.5)
CO2: 27 mEq/L (ref 19–32)
Chloride: 104 mEq/L (ref 96–112)
Creatinine, Ser: 0.6 mg/dL (ref 0.4–1.2)
GFR: 122.02 mL/min (ref >60.00)
GLUCOSE: 80 mg/dL (ref 70–99)
Potassium: 4.1 mEq/L (ref 3.5–5.1)
SODIUM: 138 meq/L (ref 135–145)

## 2013-05-19 LAB — HEPATIC FUNCTION PANEL
ALBUMIN: 3.7 g/dL (ref 3.5–5.2)
ALT: 19 U/L (ref 0–35)
AST: 18 U/L (ref 0–37)
Alkaline Phosphatase: 49 U/L (ref 39–117)
Bilirubin, Direct: 0 mg/dL (ref 0.0–0.3)
Total Bilirubin: 0.2 mg/dL — ABNORMAL LOW (ref 0.3–1.2)
Total Protein: 7 g/dL (ref 6.0–8.3)

## 2013-05-19 LAB — C-REACTIVE PROTEIN: CRP: 0.8 mg/dL (ref 0.5–20.0)

## 2013-05-19 LAB — TSH: TSH: 1.49 u[IU]/mL (ref 0.35–5.50)

## 2013-05-19 LAB — SEDIMENTATION RATE: SED RATE: 12 mm/h (ref 0–22)

## 2013-05-19 MED ORDER — BUDESONIDE 3 MG PO CP24: 9.0000 mg | ORAL_CAPSULE | Freq: Every day | ORAL | Status: DC

## 2013-05-19 MED ORDER — OMEPRAZOLE 20 MG PO CPDR: 20.0000 mg | DELAYED_RELEASE_CAPSULE | Freq: Every day | ORAL | Status: DC

## 2013-05-19 NOTE — Progress Notes (Signed)
    History of Present Illness: This is a 23 year old female whom I evaluated in 2013 with suspected but not proven Crohn's ileitis. She was treated with budesonide and apparently her symptoms improved. She did not return for recommended followup. She states she had health insurance problems that prevented her from returning. She states for the past 6 months she's had ongoing problems with heartburn, reflux, regurgitation, vomiting, epigastric pain, generalized abdominal cramping and 5-6 small formed bowel movements each day. She states she's been strictly following a low residue diet.  Current Medications, Allergies, Past Medical History, Past Surgical History, Family History and Social History were reviewed in [facility] electronic medical record.  Physical Exam: General: Well developed , well nourished, no acute distress Head: Normocephalic and atraumatic Eyes:  sclerae anicteric, EOMI Ears: Normal auditory acuity Mouth: No deformity or lesions Lungs: Clear throughout to auscultation Heart: Regular rate and rhythm; no murmurs, rubs or bruits Abdomen: Soft, mild epigastric tenderness and non distended. No masses, hepatosplenomegaly or hernias noted. Normal Bowel sounds Rectal: Deferred Musculoskeletal: Symmetrical with no gross deformities  Pulses:  Normal pulses noted Extremities: No clubbing, cyanosis, edema or deformities noted Neurological: Alert oriented x 4, grossly nonfocal Psychological:  Alert and cooperative. Normal mood and affect  Assessment and Recommendations:   1. 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 Rule out IBS. Begin a diet with moderate in fiber along with increased fluid intake. Obtain blood work. Begin budesonide 9 mg daily. Followup in 6 weeks. Consider further evaluation with CT enterography if her symptoms persist. The importance of followup care was stressed.  2. GERD. Standard antireflux measures and omeprazole 20 mg daily. Followup in 6 weeks.

## 2013-05-19 NOTE — Patient Instructions (Addendum)
Your physician has requested that you go to the basement for the following lab work before leaving today: [employer], ESR, CRP.  We have sent the following medications to your pharmacy for you to pick up at your convenience: omeprazole and budesonide.  Patient advised to avoid spicy, acidic, citrus, chocolate, mints, fruit and fruit juices.  Limit the intake of caffeine, alcohol and Soda.  Don't exercise too soon after eating.  Don't lie down within 3-4 hours of eating.  Elevate the head of your bed.  Increase your fluid intake.     High-Fiber Diet Fiber is found in fruits, vegetables, and grains. A high-fiber diet encourages the addition of more whole grains, legumes, fruits, and vegetables in your diet. The recommended amount of fiber for adult males is 38 g per day. For adult females, it is 25 g per day. Pregnant and lactating women should get 28 g of fiber per day. If you have a digestive or bowel problem, ask your caregiver for advice before adding high-fiber foods to your diet. Eat a variety of high-fiber foods instead of only a select few type of foods.  PURPOSE  To increase stool bulk.  To make bowel movements more regular to prevent constipation.  To lower cholesterol.  To prevent overeating. WHEN IS THIS DIET USED?  It may be used if you have constipation and hemorrhoids.  It may be used if you have uncomplicated diverticulosis (intestine condition) and irritable bowel syndrome.  It may be used if you need help with weight management.  It may be used if you want to add it to your diet as a protective measure against atherosclerosis, diabetes, and cancer. SOURCES OF FIBER  Whole-grain breads and cereals.  Fruits, such as apples, oranges, bananas, berries, prunes, and pears.  Vegetables, such as green peas, carrots, sweet potatoes, beets, broccoli, cabbage, spinach, and artichokes.  Legumes, such split peas, soy, lentils.  Almonds. FIBER CONTENT IN  FOODS Starches and Grains / Dietary Fiber (g)  Cheerios, 1 cup / 3 g  Corn Flakes cereal, 1 cup / 0.7 g  Rice crispy treat cereal, 1 cup / 0.3 g  Instant oatmeal (cooked),  cup / 2 g  Frosted wheat cereal, 1 cup / 5.1 g  Brown, long-grain rice (cooked), 1 cup / 3.5 g  White, long-grain rice (cooked), 1 cup / 0.6 g  Enriched macaroni (cooked), 1 cup / 2.5 g Legumes / Dietary Fiber (g)  Baked beans (canned, plain, or vegetarian),  cup / 5.2 g  Kidney beans (canned),  cup / 6.8 g  Pinto beans (cooked),  cup / 5.5 g Breads and Crackers / Dietary Fiber (g)  Plain or honey graham crackers, 2 squares / 0.7 g  Saltine crackers, 3 squares / 0.3 g  Plain, salted pretzels, 10 pieces / 1.8 g  Whole-wheat bread, 1 slice / 1.9 g  White bread, 1 slice / 0.7 g  Raisin bread, 1 slice / 1.2 g  Plain bagel, 3 oz / 2 g  Flour tortilla, 1 oz / 0.9 g  Corn tortilla, 1 small / 1.5 g  Hamburger or hotdog bun, 1 small / 0.9 g Fruits / Dietary Fiber (g)  Apple with skin, 1 medium / 4.4 g  Sweetened applesauce,  cup / 1.5 g  Banana,  medium / 1.5 g  Grapes, 10 grapes / 0.4 g  Orange, 1 small / 2.3 g  Raisin, 1.5 oz / 1.6 g  Melon, 1 cup / 1.4 g  Vegetables / Dietary Fiber (g)  Green beans (canned),  cup / 1.3 g  Carrots (cooked),  cup / 2.3 g  Broccoli (cooked),  cup / 2.8 g  Peas (cooked),  cup / 4.4 g  Mashed potatoes,  cup / 1.6 g  Lettuce, 1 cup / 0.5 g  Corn (canned),  cup / 1.6 g  Tomato,  cup / 1.1 g Document Released: 04/10/2005 Document Revised: 10/10/2011 Document Reviewed: 07/13/2011 [facility] Patient Information 2014 [place], [place].  Thank you for choosing me and [facility].  Pricilla Riffle. Dagoberto Ligas., MD., Marval Regal

## 2013-06-20 ENCOUNTER — Telehealth: Payer: Self-pay | Admitting: Nurse Practitioner

## 2013-06-20 ENCOUNTER — Telehealth: Payer: Self-pay | Admitting: *Deleted

## 2013-06-20 NOTE — Telephone Encounter (Signed)
Patient states after I called her, her abdomen started to feel tight and bloated. States she is uncomfortable but not as bad as yesterday. She is worried because it is Friday. Please, advise.

## 2013-06-20 NOTE — Telephone Encounter (Signed)
Patient states she is fine right now. She states the feeling comes and goes. She will keep her OV.

## 2013-06-20 NOTE — Telephone Encounter (Signed)
Message copied by Daphine DeutscherMILLER, Macon Lesesne N on Fri Jun 20, 2013 10:56 AM ------      Message from: Meredith PelGUENTHER, PAULA M      Created: Fri Jun 20, 2013 10:16 AM       Rene Kocheregina,      I spoke to [place] Key[place] yesterday when she called answering service with abdominal pain. Please see how she feels today. She has a scheduled appt in a couple of weeks.       Thanks ------

## 2013-06-20 NOTE — Telephone Encounter (Signed)
Left a message for patient to call with update.

## 2013-06-20 NOTE — Telephone Encounter (Signed)
Patient called answering service yesterday around 4pm (office closed due to snow) with complaints of acute mid lower abdominal pain and loose stool over last 24 hours. No vomiting. No fevers. No dysuria. Denies chance of pregnancy. She has crohn's disease, is maintained on Budesonide but patient doesn't feel like this is crohn's flare. She is tolerating fluids. Advised patient to seek care at urgent care or ED if fevers, intolerable pain or if unable to hold down fluids. Otherwise our office would call her tomorrow for condition update.

## 2013-07-07 ENCOUNTER — Ambulatory Visit: Payer: BC Managed Care – PPO | Admitting: Gastroenterology

## 2013-07-30 ENCOUNTER — Ambulatory Visit: Payer: BC Managed Care – PPO | Admitting: Gastroenterology

## 2013-08-20 ENCOUNTER — Ambulatory Visit: Payer: BC Managed Care – PPO | Admitting: Gastroenterology

## 2013-08-22 NOTE — Telephone Encounter (Signed)
See Previous Encounter

## 2014-08-27 ENCOUNTER — Encounter (HOSPITAL_BASED_OUTPATIENT_CLINIC_OR_DEPARTMENT_OTHER): Payer: Self-pay | Admitting: Emergency Medicine

## 2014-08-27 ENCOUNTER — Telehealth: Payer: Self-pay | Admitting: Gastroenterology

## 2014-08-27 ENCOUNTER — Emergency Department (HOSPITAL_BASED_OUTPATIENT_CLINIC_OR_DEPARTMENT_OTHER): Payer: Managed Care, Other (non HMO)

## 2014-08-27 ENCOUNTER — Emergency Department (HOSPITAL_BASED_OUTPATIENT_CLINIC_OR_DEPARTMENT_OTHER)
Admission: EM | Admit: 2014-08-27 | Discharge: 2014-08-27 | Disposition: A | Discharge status: Home / Self Care | Payer: Managed Care, Other (non HMO) | Attending: Emergency Medicine | Admitting: Emergency Medicine

## 2014-08-27 DIAGNOSIS — R109 Unspecified abdominal pain: Secondary | ICD-10-CM

## 2014-08-27 DIAGNOSIS — Z3202 Encounter for pregnancy test, result negative: Secondary | ICD-10-CM | POA: Diagnosis not present

## 2014-08-27 DIAGNOSIS — K5900 Constipation, unspecified: Secondary | ICD-10-CM | POA: Diagnosis present

## 2014-08-27 DIAGNOSIS — Z87891 Personal history of nicotine dependence: Secondary | ICD-10-CM | POA: Diagnosis not present

## 2014-08-27 DIAGNOSIS — K5090 Crohn's disease, unspecified, without complications: Secondary | ICD-10-CM | POA: Insufficient documentation

## 2014-08-27 DIAGNOSIS — R110 Nausea: Secondary | ICD-10-CM | POA: Diagnosis not present

## 2014-08-27 DIAGNOSIS — Z79899 Other long term (current) drug therapy: Secondary | ICD-10-CM | POA: Diagnosis not present

## 2014-08-27 DIAGNOSIS — Z8659 Personal history of other mental and behavioral disorders: Secondary | ICD-10-CM | POA: Insufficient documentation

## 2014-08-27 LAB — CBC WITH DIFFERENTIAL/PLATELET
BASOS PCT: 0 % (ref 0–1)
Basophils Absolute: 0 10*3/uL (ref 0.0–0.1)
EOS ABS: 0.1 10*3/uL (ref 0.0–0.7)
Eosinophils Relative: 0 % (ref 0–5)
HCT: 43.7 % (ref 36.0–46.0)
Hemoglobin: 15 g/dL (ref 12.0–15.0)
LYMPHS ABS: 2.5 10*3/uL (ref 0.7–4.0)
Lymphocytes Relative: 15 % (ref 12–46)
MCH: 29.9 pg (ref 26.0–34.0)
MCHC: 34.3 g/dL (ref 30.0–36.0)
MCV: 87.2 fL (ref 78.0–100.0)
MONO ABS: 0.9 10*3/uL (ref 0.1–1.0)
MONOS PCT: 6 % (ref 3–12)
NEUTROS PCT: 79 % — AB (ref 43–77)
Neutro Abs: 12.7 10*3/uL — ABNORMAL HIGH (ref 1.7–7.7)
PLATELETS: 242 10*3/uL (ref 150–400)
RBC: 5.01 MIL/uL (ref 3.87–5.11)
RDW: 12.5 % (ref 11.5–15.5)
WBC: 16.2 10*3/uL — AB (ref 4.0–10.5)

## 2014-08-27 LAB — LIPASE, BLOOD: Lipase: 34 U/L (ref 22–51)

## 2014-08-27 LAB — OCCULT BLOOD X 1 CARD TO LAB, STOOL: Fecal Occult Bld: NEGATIVE

## 2014-08-27 LAB — PREGNANCY, URINE: Preg Test, Ur: NEGATIVE

## 2014-08-27 MED ORDER — FENTANYL CITRATE (PF) 100 MCG/2ML IJ SOLN
50.0000 ug | Freq: Once | INTRAMUSCULAR | Status: AC
  Administered 2014-08-27: 50 ug via INTRAVENOUS
  Filled 2014-08-27: qty 2

## 2014-08-27 MED ORDER — IOHEXOL 300 MG/ML  SOLN
100.0000 mL | Freq: Once | INTRAMUSCULAR | Status: AC | PRN
  Administered 2014-08-27: 100 mL via INTRAVENOUS

## 2014-08-27 MED ORDER — HYDROMORPHONE HCL 1 MG/ML IJ SOLN
1.0000 mg | Freq: Once | INTRAMUSCULAR | Status: AC
  Administered 2014-08-27: 1 mg via INTRAVENOUS
  Filled 2014-08-27: qty 1

## 2014-08-27 MED ORDER — SODIUM CHLORIDE 0.9 % IV SOLN
INTRAVENOUS | Status: DC
  Administered 2014-08-27: 20:00:00 via INTRAVENOUS

## 2014-08-27 MED ORDER — SODIUM CHLORIDE 0.9 % IV BOLUS (SEPSIS)
1000.0000 mL | Freq: Once | INTRAVENOUS | Status: AC
  Administered 2014-08-27: 1000 mL via INTRAVENOUS

## 2014-08-27 MED ORDER — DOCUSATE SODIUM 100 MG PO CAPS: 100.0000 mg | ORAL_CAPSULE | Freq: Two times a day (BID) | ORAL | Status: DC

## 2014-08-27 MED ORDER — ONDANSETRON HCL 4 MG/2ML IJ SOLN
4.0000 mg | Freq: Once | INTRAMUSCULAR | Status: AC
  Administered 2014-08-27: 4 mg via INTRAVENOUS
  Filled 2014-08-27: qty 2

## 2014-08-27 MED ORDER — IOHEXOL 300 MG/ML  SOLN
50.0000 mL | Freq: Once | INTRAMUSCULAR | Status: AC | PRN
Start: 2014-08-27 — End: 2014-08-27
  Administered 2014-08-27: 50 mL via ORAL

## 2014-08-27 NOTE — Discharge Instructions (Signed)
Take the Colace as directed. Increase fluid intake. Follow-up with your regular doctor. Return for any new or worse symptoms. Okay to take the medicines like Motrin or Naprosyn.

## 2014-08-27 NOTE — ED Notes (Signed)
Mother at [facility], pt back up to b/r.

## 2014-08-27 NOTE — Telephone Encounter (Signed)
Left message for patient to call back  

## 2014-08-27 NOTE — ED Notes (Signed)
Pt alert, NAD, calm, interactive, resps e/u, no dyspnea noted, drinking contrast over 2 hrs. Mother leaving BS.

## 2014-08-27 NOTE — ED Provider Notes (Signed)
CSN: [id number]     Arrival date & time 08/27/14  1754 History  This chart was scribed for Lauren Mulders, MD by Annye Asa, ED Scribe. This patient was seen in room MH12/MH12 and the patient's care was started at 7:04 PM.    Chief Complaint  Patient presents with  . Constipation   Patient is a 24 y.o. female presenting with constipation. The history is provided by the patient. No language interpreter was used.  Constipation Severity:  Moderate Time since last bowel movement:  4 days Timing:  Constant Progression:  Worsening Chronicity:  New Context: dehydration   Context: not medication and not narcotics   Stool description:  None produced Relieved by:  Nothing Worsened by:  Nothing tried Ineffective treatments:  None tried Associated symptoms: abdominal pain and nausea   Associated symptoms: no back pain, no diarrhea, no dysuria, no fever and no vomiting   Risk factors: no obesity and no recent surgery   Risk factors comment:  Crohn's disease    HPI Comments: Lauren Allen is a 24 y.o. female with past medical history of Crohn's disease (diagnosed 2013) who presents to the Emergency Department complaining of 4 days of constipation and associated abdominal pain. She also reports rectal pain, as well as nausea and dizziness when straining to have a bowel movement. Patient notes she has recently cut back on soft drink intake and has not replaced this fluid with water, states, "I have not had anything to drink in several days."   PCP Dr. Arlyce Dice at [facility] with [facility].  GI Dr. Russella Dar  Past Medical History  Diagnosis Date  . Headache(784.0)   . Anxiety   . Depression   . Ileitis   . Crohn's disease    Past Surgical History  Procedure Laterality Date  . Tonsillectomy     Family History  Problem Relation Age of Onset  . Colon cancer Maternal Grandfather   . Celiac disease Maternal Aunt   . Diabetes Maternal Grandmother   . Heart disease      History  Substance Use Topics  . Smoking status: Former Smoker -- 0.50 packs/day  . Smokeless tobacco: Never Used  . Alcohol Use: Yes     Comment: occasional   OB History    No data available     Review of Systems  Constitutional: Negative for fever and chills.  HENT: Negative for rhinorrhea and sore throat.   Eyes: Negative for visual disturbance.  Respiratory: Negative for cough and shortness of breath.   Cardiovascular: Negative for chest pain and leg swelling.  Gastrointestinal: Positive for nausea, abdominal pain and constipation. Negative for vomiting and diarrhea.       Rectal pain  Genitourinary: Negative for dysuria, frequency and hematuria.  Musculoskeletal: Negative for back pain and arthralgias.  Skin: Negative for rash.  Neurological: Negative for headaches.  Hematological: Does not bruise/bleed easily.  Psychiatric/Behavioral: Negative for confusion.      Allergies  Sulfonamide derivatives  Home Medications   Prior to Admission medications   Medication Sig Start Date End Date Taking? Authorizing Provider  budesonide (ENTOCORT EC) 3 MG 24 hr capsule Take 3 capsules (9 mg total) by mouth daily. 05/19/13   Meryl Dare, MD  docusate sodium (COLACE) 100 MG capsule Take 1 capsule (100 mg total) by mouth every 12 (twelve) hours. 08/27/14   Lauren Mulders, MD  GIANVI 3-0.02 MG tablet Take 1 tablet by mouth daily.  11/13/11   Historical Provider, MD  omeprazole (  PRILOSEC) 20 MG capsule Take 1 capsule (20 mg total) by mouth daily. 05/19/13   Meryl Dare, MD   BP 125/72 mmHg  Pulse 82  Temp(Src) 98.3 F (36.8 C) (Oral)  Resp 18  Ht  (1.575 m)  Wt 155 lb (70.308 kg)  BMI 28.34 kg/m2  SpO2 100%  LMP 08/20/2014 Physical Exam  Constitutional: She is oriented to person, place, and time. She appears well-developed and well-nourished.  HENT:  Head: Normocephalic and atraumatic.  Eyes: EOM are normal. Pupils are equal, round, and reactive to light. No  scleral icterus.  Neck: No tracheal deviation present.  Cardiovascular: Normal rate, regular rhythm and normal heart sounds.  Exam reveals no gallop and no friction rub.   No murmur heard. Pulmonary/Chest: Effort normal and breath sounds normal. No respiratory distress. She has no wheezes. She has no rales.  Abdominal: Soft. Bowel sounds are normal. There is tenderness (Epigastric, RUQ, RLQ, LLQ, suprapubic). There is no rebound and no guarding.  Genitourinary:  No external hemorrhoids; no prolapsed internal hemorrhoids; no fissures. In the rectal vault, there was an increased amount of stool but not impacted. Hemoccult card sent.   Neurological: She is alert and oriented to person, place, and time.  Skin: Skin is warm and dry.  Psychiatric: She has a normal mood and affect. Her behavior is normal.  Nursing note and vitals reviewed.   ED Course  Procedures   DIAGNOSTIC STUDIES: Oxygen Saturation is 98% on RA, normal by my interpretation.    COORDINATION OF CARE: 7:12 PM Discussed treatment plan with pt at bedside and pt agreed to plan.  Medications  0.9 %  sodium chloride infusion ( Intravenous Stopped 08/27/14 2203)  HYDROmorphone (DILAUDID) injection 1 mg (not administered)  sodium chloride 0.9 % bolus 1,000 mL (0 mLs Intravenous Stopped 08/27/14 2125)  ondansetron (ZOFRAN) injection 4 mg (4 mg Intravenous Given 08/27/14 1937)  iohexol (OMNIPAQUE) 300 MG/ML solution 50 mL (50 mLs Oral Contrast Given 08/27/14 1941)  fentaNYL (SUBLIMAZE) injection 50 mcg (50 mcg Intravenous Given 08/27/14 2022)  iohexol (OMNIPAQUE) 300 MG/ML solution 100 mL (100 mLs Intravenous Contrast Given 08/27/14 2130)    Results for orders placed or performed during the hospital encounter of 08/27/14  Lipase, blood  Result Value Ref Range   Lipase 34 22 - 51 U/L  CBC with Differential/Platelet  Result Value Ref Range   WBC 16.2 (H) 4.0 - 10.5 K/uL   RBC 5.01 3.87 - 5.11 MIL/uL   Hemoglobin 15.0 12.0 - 15.0 g/dL    HCT 82.9 56.2 - 13.0 %   MCV 87.2 78.0 - 100.0 fL   MCH 29.9 26.0 - 34.0 pg   MCHC 34.3 30.0 - 36.0 g/dL   RDW 86.5 78.4 - 69.6 %   Platelets 242 150 - 400 K/uL   Neutrophils Relative % 79 (H) 43 - 77 %   Neutro Abs 12.7 (H) 1.7 - 7.7 K/uL   Lymphocytes Relative 15 12 - 46 %   Lymphs Abs 2.5 0.7 - 4.0 K/uL   Monocytes Relative 6 3 - 12 %   Monocytes Absolute 0.9 0.1 - 1.0 K/uL   Eosinophils Relative 0 0 - 5 %   Eosinophils Absolute 0.1 0.0 - 0.7 K/uL   Basophils Relative 0 0 - 1 %   Basophils Absolute 0.0 0.0 - 0.1 K/uL  Pregnancy, urine  Result Value Ref Range   Preg Test, Ur NEGATIVE NEGATIVE  Occult blood card to lab, stool  Result  Value Ref Range   Fecal Occult Bld NEGATIVE NEGATIVE   Ct Abdomen Pelvis W Contrast  08/27/2014   CLINICAL DATA:  Abdominal pain 4 days. Nausea, dizziness, constipation. History of Crohn's disease.  EXAM: CT ABDOMEN AND PELVIS WITH CONTRAST  TECHNIQUE: Multidetector CT imaging of the abdomen and pelvis was performed using the standard protocol following bolus administration of intravenous contrast.  CONTRAST:  50mL OMNIPAQUE IOHEXOL 300 MG/ML SOLN, OMNIPAQUE IOHEXOL 300 MG/ML SOLN  COMPARISON:  None.  FINDINGS: Lower chest: Lung bases are clear.  Hepatobiliary: 9 mm hypervascular lesion in the central right hepatic lobe anterior to the gallbladder fossa (image 21, series 2). No biliary duct dilatation. The gallbladder is normal.  Pancreas: Pancreas is normal. No ductal dilatation. No pancreatic inflammation.  Spleen: Normal spleen  Adrenals/urinary tract: Adrenal glands and kidneys are normal. The ureters and bladder normal.  Stomach/Bowel: Stomach and duodenum are normal. Normal mucosal pattern of the jejunum and ileum. Oral contrast only progresses study 3/4 through the small bowel. The terminal ileum appears normal without evidence of inflammation or stricture. The appendix is normal. Ascending, transverse, descending, and rectosigmoid colon are normal.   Vascular/Lymphatic: Abdominal aorta is normal caliber. There is no retroperitoneal or periportal lymphadenopathy. No pelvic lymphadenopathy.  Reproductive: Uterus and ovaries are normal.  Musculoskeletal: Normal SI joints.  Other: No free fluid.  IMPRESSION: 1. No evidence of inflammation of the bowel. Terminal ileum is normal. 2. Moderate volume stool throughout the colon. 3. Normal appendix. 4. Small hypervascular lesion in the right hepatic lobe likely represents a flash filling hemangioma or other benign vascular phenomena.   Electronically Signed   By: Genevive Bi M.D.   On: 08/27/2014 21:49     EKG Interpretation None      MDM   Final diagnoses:  Abdominal pain  Constipation, unspecified constipation type   CT scan now shows evidence of some constipation. Moderate amount. No evidence of any exacerbation or recurrence of the Crohn's. Patient with elevated white blood cell count exact cause of that is not clear. Not explained by the CT scan of the abdomen. Stool was heme-negative. Rectal vault had some stool in the vault that was not consistent with impaction but that was broken up. Patient will be treated with Colace.  I personally performed the services described in this documentation, which was scribed in my presence. The recorded information has been reviewed and is accurate.       Lauren Mulders, MD 08/27/14 2329

## 2014-08-27 NOTE — ED Notes (Addendum)
Pt in c/o of constipation and possible impaction. States hx of Crohn's but that this is her first problem with not being able to pass stool. States she attempted to pass stool and noticed a small amount of bright red blood afterward. Pt is tearful in triage.

## 2014-08-27 NOTE — ED Notes (Signed)
Back up to b/r, steady gait, back to room w/o incident or change, to CT via stretcher. Alert, NAD, calm.

## 2014-08-27 NOTE — ED Notes (Signed)
Dr. Deretha Emory at [facility], pt/ family updated.

## 2014-08-28 NOTE — Telephone Encounter (Signed)
Left message for patient to call back Patient was evaluated in the ER last night

## 2014-08-31 NOTE — Telephone Encounter (Signed)
No return call from the patient I left a message if she still needs an appt to please call back\.  I will await a return call from the patient

## 2015-03-27 ENCOUNTER — Encounter (HOSPITAL_BASED_OUTPATIENT_CLINIC_OR_DEPARTMENT_OTHER): Payer: Self-pay | Admitting: Emergency Medicine

## 2015-03-27 ENCOUNTER — Emergency Department (HOSPITAL_BASED_OUTPATIENT_CLINIC_OR_DEPARTMENT_OTHER): Payer: BLUE CROSS/BLUE SHIELD

## 2015-03-27 ENCOUNTER — Emergency Department (HOSPITAL_BASED_OUTPATIENT_CLINIC_OR_DEPARTMENT_OTHER)
Admission: EM | Admit: 2015-03-27 | Discharge: 2015-03-27 | Disposition: A | Discharge status: Home / Self Care | Payer: BLUE CROSS/BLUE SHIELD | Attending: Emergency Medicine | Admitting: Emergency Medicine

## 2015-03-27 DIAGNOSIS — S0093XA Contusion of unspecified part of head, initial encounter: Secondary | ICD-10-CM

## 2015-03-27 DIAGNOSIS — Z79899 Other long term (current) drug therapy: Secondary | ICD-10-CM | POA: Insufficient documentation

## 2015-03-27 DIAGNOSIS — Z8719 Personal history of other diseases of the digestive system: Secondary | ICD-10-CM | POA: Diagnosis not present

## 2015-03-27 DIAGNOSIS — F329 Major depressive disorder, single episode, unspecified: Secondary | ICD-10-CM | POA: Diagnosis not present

## 2015-03-27 DIAGNOSIS — S0003XA Contusion of scalp, initial encounter: Secondary | ICD-10-CM | POA: Insufficient documentation

## 2015-03-27 DIAGNOSIS — S199XXA Unspecified injury of neck, initial encounter: Secondary | ICD-10-CM | POA: Diagnosis not present

## 2015-03-27 DIAGNOSIS — Z7951 Long term (current) use of inhaled steroids: Secondary | ICD-10-CM | POA: Insufficient documentation

## 2015-03-27 DIAGNOSIS — F17200 Nicotine dependence, unspecified, uncomplicated: Secondary | ICD-10-CM | POA: Diagnosis not present

## 2015-03-27 DIAGNOSIS — Y9389 Activity, other specified: Secondary | ICD-10-CM | POA: Diagnosis not present

## 2015-03-27 DIAGNOSIS — Y998 Other external cause status: Secondary | ICD-10-CM | POA: Insufficient documentation

## 2015-03-27 DIAGNOSIS — F419 Anxiety disorder, unspecified: Secondary | ICD-10-CM | POA: Diagnosis not present

## 2015-03-27 DIAGNOSIS — Y92410 Unspecified street and highway as the place of occurrence of the external cause: Secondary | ICD-10-CM | POA: Diagnosis not present

## 2015-03-27 DIAGNOSIS — S0990XA Unspecified injury of head, initial encounter: Secondary | ICD-10-CM | POA: Diagnosis present

## 2015-03-27 MED ORDER — IBUPROFEN 800 MG PO TABS
800.0000 mg | ORAL_TABLET | Freq: Once | ORAL | Status: AC
  Administered 2015-03-27: 800 mg via ORAL
  Filled 2015-03-27: qty 1

## 2015-03-27 MED ORDER — HYDROCODONE-ACETAMINOPHEN 5-325 MG PO TABS
1.0000 | ORAL_TABLET | Freq: Once | ORAL | Status: AC
  Administered 2015-03-27: 1 via ORAL
  Filled 2015-03-27: qty 1

## 2015-03-27 MED ORDER — CYCLOBENZAPRINE HCL 10 MG PO TABS: 10.0000 mg | ORAL_TABLET | Freq: Two times a day (BID) | ORAL | Status: DC | PRN

## 2015-03-27 MED ORDER — NAPROXEN 500 MG PO TABS: 500.0000 mg | ORAL_TABLET | Freq: Two times a day (BID) | ORAL | Status: DC
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
## 2015-03-27 NOTE — Discharge Instructions (Signed)
Facial or Scalp Contusion °A facial or scalp contusion is a deep bruise on the face or head. Injuries to the face and head generally cause a lot of swelling, especially around the eyes. Contusions are the result of an injury that caused bleeding under the skin. The contusion may turn blue, purple, or yellow. Minor injuries will give you a painless contusion, but more severe contusions may stay painful and swollen for a few weeks.  °CAUSES  °A facial or scalp contusion is caused by a blunt injury or trauma to the face or head area.  °SIGNS AND SYMPTOMS  °· Swelling of the injured area.   °· Discoloration of the injured area.   °· Tenderness, soreness, or pain in the injured area.   °DIAGNOSIS  °The diagnosis can be made by taking a medical history and doing a physical exam. An X-ray exam, CT scan, or MRI may be needed to determine if there are any associated injuries, such as broken bones (fractures). °TREATMENT  °Often, the best treatment for a facial or scalp contusion is applying cold compresses to the injured area. Over-the-counter medicines may also be recommended for pain control.  °HOME CARE INSTRUCTIONS  °· Only take over-the-counter or prescription medicines as directed by your health care provider.   °· Apply ice to the injured area.   °· Put ice in a plastic bag.   °· Place a towel between your skin and the bag.   °· Leave the ice on for 20 minutes, 2-3 times a day.   °SEEK MEDICAL CARE IF: °· You have bite problems.   °· You have pain with chewing.   °· You are concerned about facial defects. °SEEK IMMEDIATE MEDICAL CARE IF: °· You have severe pain or a headache that is not relieved by medicine.   °· You have unusual sleepiness, confusion, or personality changes.   °· You throw up (vomit).   °· You have a persistent nosebleed.   °· You have double vision or blurred vision.   °· You have fluid drainage from your nose or ear.   °· You have difficulty walking or using your arms or legs.   °MAKE SURE YOU:   °· Understand these instructions. °· Will watch your condition. °· Will get help right away if you are not doing well or get worse. °  °This information is not intended to replace advice given to you by your health care provider. Make sure you discuss any questions you have with your health care provider. °  °Document Released: 05/18/2004 Document Revised: 05/01/2014 Document Reviewed: 11/21/2012 °Elsevier Interactive Patient Education ©2016 [employer] ° °Motor Vehicle Collision °It is common to have multiple bruises and sore muscles after a motor vehicle collision (MVC). These tend to feel worse for the first 24 hours. You may have the most stiffness and soreness over the first several hours. You may also feel worse when you wake up the first morning after your collision. After this point, you will usually begin to improve with each day. The speed of improvement often depends on the severity of the collision, the number of injuries, and the location and nature of these injuries. °HOME CARE INSTRUCTIONS °· Put ice on the injured area. °¨ Put ice in a plastic bag. °¨ Place a towel between your skin and the bag. °¨ Leave the ice on for 15-20 minutes, 3-4 times a day, or as directed by your health care provider. °· Drink enough fluids to keep your urine clear or pale yellow. Do not drink alcohol. °· Take a warm shower or bath once or twice   a day. This will increase blood flow to sore muscles. °· You may return to activities as directed by your caregiver. Be careful when lifting, as this may aggravate neck or back pain. °· Only take over-the-counter or prescription medicines for pain, discomfort, or fever as directed by your caregiver. Do not use aspirin. This may increase bruising and bleeding. °SEEK IMMEDIATE MEDICAL CARE IF: °· You have numbness, tingling, or weakness in the arms or legs. °· You develop severe headaches not relieved with medicine. °· You have severe neck pain, especially tenderness in the middle  of the back of your neck. °· You have changes in bowel or bladder control. °· There is increasing pain in any area of the body. °· You have shortness of breath, light-headedness, dizziness, or fainting. °· You have chest pain. °· You feel sick to your stomach (nauseous), throw up (vomit), or sweat. °· You have increasing abdominal discomfort. °· There is blood in your urine, stool, or vomit. °· You have pain in your shoulder (shoulder strap areas). °· You feel your symptoms are getting worse. °MAKE SURE YOU: °· Understand these instructions. °· Will watch your condition. °· Will get help right away if you are not doing well or get worse. °  °This information is not intended to replace advice given to you by your health care provider. Make sure you discuss any questions you have with your health care provider. °  °Document Released: 04/10/2005 Document Revised: 05/01/2014 Document Reviewed: 09/07/2010 °Elsevier Interactive Patient Education ©2016 Elsevier Inc. ° °
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
## 2015-08-28 ENCOUNTER — Telehealth: Payer: Self-pay | Admitting: Gastroenterology

## 2015-08-28 MED ORDER — NA SULFATE-K SULFATE-MG SULF 17.5-3.13-1.6 GM/177ML PO SOLN: 1.0000 | Freq: Once | ORAL | Status: DC

## 2015-08-28 NOTE — Telephone Encounter (Signed)
Patient called with c/o severe constipation. She took a bottle of mag citrate last Sunday, and has been taking stool softeners for past 1 week but no BM for last 2 weeks. She also did an enema. She is passing air and no nausea or vomiting.  Advised patient to do a bowel purge and remain on liquid diet today. Also recommended patient to avoid excessive fiber.

## 2016-01-31 ENCOUNTER — Encounter (HOSPITAL_BASED_OUTPATIENT_CLINIC_OR_DEPARTMENT_OTHER): Payer: Self-pay | Admitting: Emergency Medicine

## 2016-01-31 ENCOUNTER — Emergency Department (HOSPITAL_BASED_OUTPATIENT_CLINIC_OR_DEPARTMENT_OTHER)
Admission: EM | Admit: 2016-01-31 | Discharge: 2016-01-31 | Disposition: A | Discharge status: Home / Self Care | Payer: BLUE CROSS/BLUE SHIELD | Attending: Emergency Medicine | Admitting: Emergency Medicine

## 2016-01-31 DIAGNOSIS — F17200 Nicotine dependence, unspecified, uncomplicated: Secondary | ICD-10-CM | POA: Insufficient documentation

## 2016-01-31 DIAGNOSIS — J029 Acute pharyngitis, unspecified: Secondary | ICD-10-CM | POA: Diagnosis not present

## 2016-01-31 DIAGNOSIS — R509 Fever, unspecified: Secondary | ICD-10-CM | POA: Diagnosis present

## 2016-01-31 LAB — RAPID STREP SCREEN (MED CTR MEBANE ONLY): STREPTOCOCCUS, GROUP A SCREEN (DIRECT): NEGATIVE

## 2016-01-31 LAB — MONONUCLEOSIS SCREEN: Mono Screen: NEGATIVE

## 2016-01-31 NOTE — Discharge Instructions (Signed)
You were seen in the ED today with sore throat. The test for strep and mono were both negative. Continue to manage at home with pain medication and fever-reducing medication.   Return to the ED with any difficulty breathing, difficulty swallowing, or voice changes.

## 2016-01-31 NOTE — ED Provider Notes (Signed)
Emergency Department Provider Note   I have reviewed the triage vital signs and the nursing notes.   HISTORY  Chief Complaint Sore Throat and Fever   HPI Lauren Allen is a 25 y.o. female with PMH of Crohn's disease presents to the emergency department for evaluation of 1 week of fever, cough, sore throat, and muscle aches. Patient has been treating conservatively at home with over-the-counter medications and rest. She is able to drink fluids and eat without difficulty. No voice changes. No significant difficulty breathing although she does have some nasal congestion. No sick contacts. She began having vomiting this morning that was nonbloody/nonbilious. She denies abdominal discomfort or associated diarrhea. She does have a history of mononucleosis in the distant past. Her tonsils were removed in childhood.   Past Medical History:  Diagnosis Date  . Anxiety   . Crohn's disease (HCC)   . Depression   . Headache(784.0)   . Ileitis     Patient Active Problem List   Diagnosis Date Noted  . ANXIETY 06/30/2009  . DEPRESSION 06/30/2009    Past Surgical History:  Procedure Laterality Date  . TONSILLECTOMY      Current Outpatient Rx  . Order #: [id number] Class: Normal  . Order #: [id number] Class: Print  . Order #: [id number] Class: Print  . Order #: [id number] Class: Historical Med  . Order #: [id number] Class: Historical Med  . Order #: [id number] Class: Normal  . Order #: [id number] Class: Print  . Order #: [id number] Class: Normal    Allergies Sulfonamide derivatives  Family History  Problem Relation Age of Onset  . Colon cancer Maternal Grandfather   . Celiac disease Maternal Aunt   . Diabetes Maternal Grandmother   . Heart disease      Social History Social History  Substance Use Topics  . Smoking status: Current Every Day Smoker    Packs/day: 0.50  . Smokeless tobacco: Never Used  . Alcohol use Yes     Comment: occasional    Review of Systems  Constitutional:  Positive fever/chills Eyes: No visual changes. ENT: Positive sore throat. Cardiovascular: Denies chest pain. Respiratory: Denies shortness of breath. Positive cough.  Gastrointestinal: No abdominal pain.  No nausea, Positive vomiting.  No diarrhea.  No constipation. Genitourinary: Negative for dysuria. Musculoskeletal: Negative for back pain. Skin: Negative for rash. Neurological: Negative for headaches, focal weakness or numbness.  10-point ROS otherwise negative.  ____________________________________________   PHYSICAL EXAM:  VITAL SIGNS: ED Triage Vitals  Enc Vitals Group     BP 01/31/16 0913 121/84     Pulse Rate 01/31/16 0913 108     Resp 01/31/16 0913 16     Temp 01/31/16 0913 99.7 F (37.6 C)     Temp Source 01/31/16 0913 Oral     SpO2 01/31/16 0913 100 %     Weight 01/31/16 0914 145 lb (65.8 kg)     Height 01/31/16 0914 5\' 3"  (1.6 m)     Pain Score 01/31/16 0914 10   Constitutional: Alert and oriented. Well appearing and in no acute distress. Eyes: Conjunctivae are normal.  Head: Atraumatic. Nose: No congestion/rhinnorhea. Mouth/Throat: Mucous membranes are dry.  Oropharynx with erythematous with scant exudate. No tonsils appreciated. No PTA. Normal tone of voice. Managing oral secretions.  Neck: No stridor. Positive cervical adenopathy.  Cardiovascular: Tachycardia. Good peripheral circulation. Grossly normal heart sounds.   Respiratory: Normal respiratory effort.  No retractions. Lungs CTAB. Gastrointestinal: Soft and nontender. No distention.  Musculoskeletal: No lower extremity tenderness nor edema. No gross  deformities of extremities. Neurologic:  Normal speech and language. No gross focal neurologic deficits are appreciated.  Skin:  Skin is warm, dry and intact. No rash noted. Psychiatric: Mood and affect are normal. Speech and behavior are normal.  ____________________________________________   LABS (all labs ordered are listed, but only abnormal  results are displayed)  Labs Reviewed  RAPID STREP SCREEN (NOT AT [facility])  CULTURE, GROUP A STREP [facility])  MONONUCLEOSIS SCREEN   ____________________________________________   PROCEDURES  Procedure(s) performed:   Procedures  None ____________________________________________   INITIAL IMPRESSION / ASSESSMENT AND PLAN / ED COURSE  Pertinent labs & imaging results that were available during my care of the patient were reviewed by me and considered in my medical decision making (see chart for details).  Patient resents to the emergency room in for evaluation of fever, cough, sore throat for the last week with vomiting today. The patient is speaking in a normal tone of voice. Managing oral secretions. Very low suspicion for deep space neck infection such as retropharyngeal abscess or epiglottitis. Plan to screen for strep throat and mono. The patient does have significant cervical adenopathy and prolonged symptomatology. Lung exam is unremarkable. Low suspicion for lower respiratory tract infection. May be early presentation of the flu but at day 7 of illness the patient would not be a good candidate for Tamiflu. Will follow rapid strep and reassess.  Rapid strep is negative along with Mono screen. Plan for symptomatic mgmt at home. Provided work note. Discussed return precautions in detail.   At this time, I do not feel there is any life-threatening condition present. I have reviewed and discussed all results (EKG, imaging, lab, urine as appropriate), exam findings with patient. I have reviewed nursing notes and appropriate previous records.  I feel the patient is safe to be discharged home without further emergent workup. Discussed usual and customary return precautions. Patient and family (if present) verbalize understanding and are comfortable with this plan.  Patient will follow-up with their primary care provider. If they do not have a primary care provider, information for follow-up has  been provided to them. All questions have been answered.  ____________________________________________  FINAL CLINICAL IMPRESSION(S) / ED DIAGNOSES  Final diagnoses:  Sore throat     MEDICATIONS GIVEN DURING THIS VISIT:  None  NEW OUTPATIENT MEDICATIONS STARTED DURING THIS VISIT:  None   Note:  This document was prepared using Dragon voice recognition software and may include unintentional dictation errors.  Alona BeneJoshua Long, MD Emergency Medicine   Maia PlanJoshua G Long, MD 01/31/16 [phone]

## 2016-01-31 NOTE — ED Triage Notes (Signed)
Sore throat, vomiting and fever x 1 week. Pt states fever was 102 last night. Last dose of ibuprofen was last night.

## 2016-02-03 LAB — CULTURE, GROUP A STREP (THRC)

## 2016-04-24 NOTE — L&D Delivery Note (Signed)
Delivery Note Pt complete with urge to push. She pushed for approx and  at 1:34 AM a viable female was delivered via Vaginal, Spontaneous Delivery (Presentation:LOA ; compound right hand  ).  APGAR: 9, 9; weight  pending.   Placenta status:delivered intact schultz , .  Cord:3vc  with the following complications:nuchal x 1 .    Anesthesia:  Epidural  Episiotomy: None Lacerations: 1st degree;Vaginal Suture Repair: vicryl 4-0  Est. Blood Loss (mL): 100  Mom to postpartum.  Baby to Couplet care / Skin to Skin  NICU was present at delivery Pt would like circumcision done in office.  Cathrine Muster 12/04/2016, 1:48 AM

## 2016-09-13 ENCOUNTER — Emergency Department (HOSPITAL_COMMUNITY)
Admission: EM | Admit: 2016-09-13 | Discharge: 2016-09-13 | Disposition: A | Discharge status: Home / Self Care | Payer: Medicaid Other | Attending: Emergency Medicine | Admitting: Emergency Medicine

## 2016-09-13 ENCOUNTER — Encounter (HOSPITAL_COMMUNITY): Payer: Self-pay

## 2016-09-13 DIAGNOSIS — O99712 Diseases of the skin and subcutaneous tissue complicating pregnancy, second trimester: Secondary | ICD-10-CM | POA: Diagnosis not present

## 2016-09-13 DIAGNOSIS — F17200 Nicotine dependence, unspecified, uncomplicated: Secondary | ICD-10-CM | POA: Insufficient documentation

## 2016-09-13 DIAGNOSIS — L0390 Cellulitis, unspecified: Secondary | ICD-10-CM | POA: Insufficient documentation

## 2016-09-13 DIAGNOSIS — R8271 Bacteriuria: Secondary | ICD-10-CM

## 2016-09-13 DIAGNOSIS — Z3A24 24 weeks gestation of pregnancy: Secondary | ICD-10-CM | POA: Insufficient documentation

## 2016-09-13 DIAGNOSIS — O2342 Unspecified infection of urinary tract in pregnancy, second trimester: Secondary | ICD-10-CM | POA: Insufficient documentation

## 2016-09-13 DIAGNOSIS — F1110 Opioid abuse, uncomplicated: Secondary | ICD-10-CM | POA: Diagnosis present

## 2016-09-13 DIAGNOSIS — O99332 Smoking (tobacco) complicating pregnancy, second trimester: Secondary | ICD-10-CM | POA: Diagnosis not present

## 2016-09-13 DIAGNOSIS — Z79899 Other long term (current) drug therapy: Secondary | ICD-10-CM | POA: Insufficient documentation

## 2016-09-13 DIAGNOSIS — O99322 Drug use complicating pregnancy, second trimester: Secondary | ICD-10-CM | POA: Diagnosis not present

## 2016-09-13 DIAGNOSIS — K5090 Crohn's disease, unspecified, without complications: Secondary | ICD-10-CM | POA: Diagnosis not present

## 2016-09-13 HISTORY — DX: Opioid dependence, uncomplicated: F11.20

## 2016-09-13 LAB — COMPREHENSIVE METABOLIC PANEL
ALK PHOS: 76 U/L (ref 38–126)
ALT: 9 U/L — ABNORMAL LOW (ref 14–54)
AST: 13 U/L — AB (ref 15–41)
Albumin: 2.9 g/dL — ABNORMAL LOW (ref 3.5–5.0)
Anion gap: 8 (ref 5–15)
BILIRUBIN TOTAL: 0.3 mg/dL (ref 0.3–1.2)
BUN: 12 mg/dL (ref 6–20)
CALCIUM: 8.7 mg/dL — AB (ref 8.9–10.3)
CO2: 26 mmol/L (ref 22–32)
Chloride: 104 mmol/L (ref 101–111)
Creatinine, Ser: 0.49 mg/dL (ref 0.44–1.00)
GFR calc Af Amer: >60 mL/min (ref >60)
GFR calc non Af Amer: >60 mL/min (ref >60)
Glucose, Bld: 106 mg/dL — ABNORMAL HIGH (ref 65–99)
Potassium: 3.9 mmol/L (ref 3.5–5.1)
Sodium: 138 mmol/L (ref 135–145)
Total Protein: 6.9 g/dL (ref 6.5–8.1)

## 2016-09-13 LAB — CBC WITH DIFFERENTIAL/PLATELET
BASOS ABS: 0 10*3/uL (ref 0.0–0.1)
Basophils Relative: 0 %
Eosinophils Absolute: 0.2 10*3/uL (ref 0.0–0.7)
Eosinophils Relative: 1 %
HEMATOCRIT: 34.1 % — AB (ref 36.0–46.0)
Hemoglobin: 11.2 g/dL — ABNORMAL LOW (ref 12.0–15.0)
LYMPHS PCT: 13 %
Lymphs Abs: 1.8 10*3/uL (ref 0.7–4.0)
MCH: 28.6 pg (ref 26.0–34.0)
MCHC: 32.8 g/dL (ref 30.0–36.0)
MCV: 87.2 fL (ref 78.0–100.0)
Monocytes Absolute: 0.5 10*3/uL (ref 0.1–1.0)
Monocytes Relative: 4 %
Neutro Abs: 11 10*3/uL — ABNORMAL HIGH (ref 1.7–7.7)
Neutrophils Relative %: 82 %
PLATELETS: 244 10*3/uL (ref 150–400)
RBC: 3.91 MIL/uL (ref 3.87–5.11)
RDW: 12.6 % (ref 11.5–15.5)
WBC: 13.4 10*3/uL — AB (ref 4.0–10.5)

## 2016-09-13 LAB — URINALYSIS, ROUTINE W REFLEX MICROSCOPIC
Bilirubin Urine: NEGATIVE
Glucose, UA: NEGATIVE mg/dL
Hgb urine dipstick: NEGATIVE
KETONES UR: NEGATIVE mg/dL
Nitrite: NEGATIVE
PH: 6 (ref 5.0–8.0)
Protein, ur: 30 mg/dL — AB
SPECIFIC GRAVITY, URINE: 1.021 (ref 1.005–1.030)

## 2016-09-13 LAB — RAPID URINE DRUG SCREEN, HOSP PERFORMED
Amphetamines: NOT DETECTED
BARBITURATES: NOT DETECTED
Benzodiazepines: POSITIVE — AB
Cocaine: NOT DETECTED
Opiates: POSITIVE — AB
Tetrahydrocannabinol: NOT DETECTED

## 2016-09-13 LAB — I-STAT BETA HCG BLOOD, ED (MC, WL, AP ONLY): I-stat hCG, quantitative: >2000 m[IU]/mL — ABNORMAL HIGH (ref <5)

## 2016-09-13 LAB — ETHANOL: Alcohol, Ethyl (B): <5 mg/dL (ref <5)

## 2016-09-13 MED ORDER — CLINDAMYCIN HCL 300 MG PO CAPS
300.0000 mg | ORAL_CAPSULE | Freq: Once | ORAL | Status: AC
  Administered 2016-09-13: 300 mg via ORAL
  Filled 2016-09-13 (×2): qty 1

## 2016-09-13 MED ORDER — CLINDAMYCIN HCL 300 MG PO CAPS: 300.0000 mg | ORAL_CAPSULE | Freq: Four times a day (QID) | ORAL | 0 refills | Status: AC

## 2016-09-13 MED ORDER — CLINDAMYCIN HCL 300 MG PO CAPS
300.0000 mg | ORAL_CAPSULE | Freq: Four times a day (QID) | ORAL | Status: DC
  Filled 2016-09-13 (×2): qty 1

## 2016-09-13 MED ORDER — CEPHALEXIN 500 MG PO CAPS: 500.0000 mg | ORAL_CAPSULE | Freq: Three times a day (TID) | ORAL | 0 refills | Status: AC

## 2016-09-13 MED ORDER — PRENATAL COMPLETE 14-0.4 MG PO TABS: 1.0000 | ORAL_TABLET | Freq: Every day | ORAL | 0 refills | Status: DC

## 2016-09-13 MED ORDER — CEPHALEXIN 500 MG PO CAPS
500.0000 mg | ORAL_CAPSULE | Freq: Four times a day (QID) | ORAL | Status: DC
  Administered 2016-09-13: 500 mg via ORAL
  Filled 2016-09-13: qty 1

## 2016-09-13 NOTE — BH Assessment (Signed)
[facility] Assessment Progress Note  Per Nanine Means, DNP, this pt does not require psychiatric hospitalization at this time.  Pt is to be discharged from [facility] with substance abuse treatment resources.  These have been included in pt's discharge instructions.  Pt's nurse has been notified.  Doylene Canning, MA Triage Specialist [phone]

## 2016-09-13 NOTE — Discharge Instructions (Addendum)
To help you maintain a sober lifestyle, a substance abuse treatment program may be beneficial to you.  Contact one of the following facilities at your earliest opportunity to ask about enrolling:  RESIDENTIAL PROGRAMS:       [facility]      [address]      [place], [place] [address]      [phone]       [facility]      [address], [place] [address]      [phone]       [facility]      Offering a variety of residential and outpatient services for pregnant women with opioid abuse problems      [phone]             OUTPATIENT PROGRAMS:       [facility]      [address]      [place], [place] [address]      [phone]      New patients are seen at the walk-in clinic every Tuesday from 9:00 am - 12:00 pm       [facility]      Chemical Dependency Intensive Outpatient Program      [address]      [place], [place] [address]      Contact Person: Charmian Muff, LCAS      [phone]       [facility]      [address] [place], [place] [address]      [phone]  For additional information about opioid dependence and how it may affect your pregnancy, visit the [facility] Pregnancy and Opioid Exposure Project website, hosted by [employer]: [email or web address].[email or web address]

## 2016-09-13 NOTE — BH Assessment (Addendum)
TTS ordered for this patient. Per notes, patient presenting to [facility] for medical clearance and a detox bed at [facility]. Patient stating that she was given these directions by someone at [facility]. Writer contacted [facility] whom verified that [facility] does not do Heroin detox nor has a bed for this patient. Writer discussed with Dr. Shaune Pollack that patient is not able to detox at [facility]. Patient to be discharged home with referrals to local detox facilities and facilities that provide specialized detox to pregnant facilities. Writer also discussed case with Nanine Means, DNP who agreed with plan of care. Dr. Shaune Pollack also agreed to terminate the TTS order.

## 2016-09-13 NOTE — ED Notes (Signed)
Patient ate 75% of lunch tray and drank .

## 2016-09-13 NOTE — ED Notes (Addendum)
Patient in room getting dressed.  Explained to patient to wait for me to get prescriptions from doctor before leaving.  Patient voiced understanding.  Upon my return to room patient no longer in room and unable to reach by phone to give prescriptions or leave voicemail.

## 2016-09-13 NOTE — ED Provider Notes (Signed)
[facility] DEPT Provider Note   CSN: [id number] Arrival date & time: 09/13/16  7262     History   Chief Complaint Chief Complaint  Patient presents with  . Medical Clearance    HPI Lauren Allen is a 26 y.o. female.  HPI 9 old female with history of depression, chronic heroin abuse, here with desire for detoxification. Patient states that she has been a long-term user of initially powder and now IV heroin. She is currently 6 months pregnant and states that she has had no comp patient with the pregnancy. She was just seen last week for her pregnancy and states that everything looked well. She has recently relapsed over the last 6-7 weeks and states she has been using heroin daily. She became concerned about her heroin use and her pregnancy and so decided to present to behavioral health for detox and evaluation. She was advised to present here for medical clearance. She otherwise denies any complaints. No abdominal pain, nausea, vomiting, vaginal bleeding, vaginal discharge, or other complaints. She does have multiple areas of mild pain at injection sites but no drainage. No fevers or chills.   Past Medical History:  Diagnosis Date  . Anxiety   . Crohn's disease ([place])   . Depression   . Headache(784.0)   . Ileitis   . Opiate addiction [facility])     Patient Active Problem List   Diagnosis Date Noted  . Heroin abuse 09/13/2016  . ANXIETY 06/30/2009  . DEPRESSION 06/30/2009    Past Surgical History:  Procedure Laterality Date  . COLONOSCOPY    . TONSILLECTOMY      OB History    Gravida Para Term Preterm AB Living   1             SAB TAB Ectopic Multiple Live Births                   Home Medications    Prior to Admission medications   Medication Sig Start Date End Date Taking? Authorizing Provider  acetaminophen (TYLENOL) 325 MG tablet Take 650 mg by mouth every 6 (six) hours as needed (headache).   Yes [provider]  hydrocortisone cream 1 % Apply  1 application topically 2 (two) times daily.   Yes [provider]  Prenatal Vit-Fe Fumarate-FA (PRENATAL MULTIVITAMIN) TABS tablet Take 1 tablet by mouth daily at 12 noon.   Yes [provider]  budesonide (ENTOCORT EC) 3 MG 24 hr capsule Take 3 capsules (9 mg total) by mouth daily. Patient not taking: Reported on 09/13/2016 05/19/13   Ladene Artist, MD  cephALEXin (KEFLEX) 500 MG capsule Take 1 capsule (500 mg total) by mouth 3 (three) times daily. 09/13/16 09/20/16  Duffy Bruce, MD  clindamycin (CLEOCIN) 300 MG capsule Take 1 capsule (300 mg total) by mouth 4 (four) times daily. 09/13/16 09/20/16  Duffy Bruce, MD  Na Sulfate-K Sulfate-Mg Sulf SOLN Take 1 kit by mouth once. Patient not taking: Reported on 09/13/2016 08/28/15   Mauri Pole, MD  omeprazole (PRILOSEC) 20 MG capsule Take 1 capsule (20 mg total) by mouth daily. Patient not taking: Reported on 09/13/2016 05/19/13   Ladene Artist, MD  Prenatal Vit-Fe Fumarate-FA (PRENATAL COMPLETE) 14-0.4 MG TABS Take 1 tablet by mouth daily. 09/13/16   Duffy Bruce, MD    Family History Family History  Problem Relation Age of Onset  . Colon cancer Maternal Grandfather   . Celiac disease Maternal Aunt   . Diabetes Maternal Grandmother   .  Heart disease Unknown     Social History Social History  Substance Use Topics  . Smoking status: Current Some Day Smoker    Packs/day: 0.50  . Smokeless tobacco: Never Used  . Alcohol use Yes     Comment: occasional     Allergies   Sulfonamide derivatives   Review of Systems Review of Systems  Constitutional: Negative for chills and fever.  HENT: Negative for congestion, rhinorrhea and sore throat.   Eyes: Negative for visual disturbance.  Respiratory: Negative for cough, shortness of breath and wheezing.   Cardiovascular: Negative for chest pain and leg swelling.  Gastrointestinal: Negative for abdominal pain, diarrhea, nausea and vomiting.  Genitourinary:  Negative for dysuria, flank pain, vaginal bleeding and vaginal discharge.  Musculoskeletal: Negative for neck pain.  Skin: Positive for rash.  Allergic/Immunologic: Negative for immunocompromised state.  Neurological: Negative for syncope and headaches.  Hematological: Does not bruise/bleed easily.  All other systems reviewed and are negative.    Physical Exam Updated Vital Signs BP 105/61 (BP Location: Right Arm)   Pulse (!) 102   Temp 98.5 F (36.9 C) (Oral)   Resp 16   SpO2 96%   Physical Exam  Constitutional: She is oriented to person, place, and time. She appears well-developed and well-nourished. No distress.  HENT:  Head: Normocephalic and atraumatic.  Eyes: Conjunctivae are normal.  Neck: Neck supple.  Cardiovascular: Normal rate, regular rhythm and normal heart sounds.  Exam reveals no friction rub.   No murmur heard. Pulmonary/Chest: Effort normal and breath sounds normal. No respiratory distress. She has no wheezes. She has no rales.  Abdominal: She exhibits no distension.  Musculoskeletal: She exhibits no edema.  Neurological: She is alert and oriented to person, place, and time. She exhibits normal muscle tone.  Skin: Skin is warm. Capillary refill takes less than 2 seconds.  Psychiatric: She has a normal mood and affect.  Nursing note and vitals reviewed.    ED Treatments / Results  Labs (all labs ordered are listed, but only abnormal results are displayed) Labs Reviewed  COMPREHENSIVE METABOLIC PANEL - Abnormal; Notable for the following:       Result Value   Glucose, Bld 106 (*)    Calcium 8.7 (*)    Albumin 2.9 (*)    AST 13 (*)    ALT 9 (*)    All other components within normal limits  RAPID URINE DRUG SCREEN, HOSP PERFORMED - Abnormal; Notable for the following:    Opiates POSITIVE (*)    Benzodiazepines POSITIVE (*)    All other components within normal limits  CBC WITH DIFFERENTIAL/PLATELET - Abnormal; Notable for the following:    WBC 13.4  (*)    Hemoglobin 11.2 (*)    HCT 34.1 (*)    Neutro Abs 11.0 (*)    All other components within normal limits  URINALYSIS, ROUTINE W REFLEX MICROSCOPIC - Abnormal; Notable for the following:    APPearance HAZY (*)    Protein, ur 30 (*)    Leukocytes, UA TRACE (*)    Bacteria, UA RARE (*)    Squamous Epithelial / LPF 6-30 (*)    All other components within normal limits  I-STAT BETA HCG BLOOD, ED ([facility], [facility], [facility] ONLY) - Abnormal; Notable for the following:    I-stat hCG, quantitative >2,000.0 (*)    All other components within normal limits  ETHANOL    EKG  EKG Interpretation None       Radiology No results  found.  Procedures Procedures (including critical care time)  Medications Ordered in ED Medications  clindamycin (CLEOCIN) capsule 300 mg (300 mg Oral Given 09/13/16 1056)     Initial Impression / Assessment and Plan / ED Course  I have reviewed the triage vital signs and the nursing notes.  Pertinent labs & imaging results that were available during my care of the patient were reviewed by me and considered in my medical decision making (see chart for details).    26 year old G1 P0 at 80 months gestational age here with desire for detoxification from heroin use. Patient otherwise medically stable. She has multiple areas of mild erythema at recent injection sites but no discrete abscesses or palpable fluctuance. Will place her on clindamycin for this. Otherwise, screening labs are unremarkable. She has a mild, likely reactive leukocytosis which is decreased from her previous values. She does have bacteria in her urine so will add Keflex for asymptomatic bacteriuria of pregnancy. Patient will complete both courses for 7 days.   Consulted TTS. Patient provided with resources for heroin detox. Discussed importance of close follow-up. No SI, HI, AVH Will otherwise discharge home.  Final Clinical Impressions(s) / ED Diagnoses   Final diagnoses:  Heroin abuse  Cellulitis of  skin  Asymptomatic bacteriuria    New Prescriptions Discharge Medication List as of 09/13/2016 12:53 PM       Duffy Bruce, MD 09/14/16 [phone]

## 2016-09-13 NOTE — ED Triage Notes (Addendum)
Pt reports needing medical clearance in order to be accepted to [facility]. Pt reports hx of opiate addiction and would like to receive help overcoming this. Pt A+OX4, speaking in complete sentences, pt reports taking x1 Klonopin last night to sleep. Pt reports taking 6 months pregnant at this time. Pt denies HI/SI/AH/VH

## 2016-11-06 ENCOUNTER — Emergency Department (HOSPITAL_COMMUNITY): Payer: Medicaid Other | Admitting: Certified Registered"

## 2016-11-06 ENCOUNTER — Emergency Department (HOSPITAL_COMMUNITY): Payer: Medicaid Other

## 2016-11-06 ENCOUNTER — Inpatient Hospital Stay (HOSPITAL_COMMUNITY)
Admission: EM | Admit: 2016-11-06 | Discharge: 2016-11-09 | DRG: 781 | Disposition: A | Discharge status: Home / Self Care | Payer: Medicaid Other | Attending: Obstetrics and Gynecology | Admitting: Obstetrics and Gynecology

## 2016-11-06 ENCOUNTER — Encounter (HOSPITAL_COMMUNITY)
Admission: EM | Disposition: A | Discharge status: Home / Self Care | Payer: Self-pay | Source: Home / Self Care | Attending: Obstetrics and Gynecology

## 2016-11-06 ENCOUNTER — Encounter (HOSPITAL_COMMUNITY): Payer: Self-pay | Admitting: Emergency Medicine

## 2016-11-06 DIAGNOSIS — A419 Sepsis, unspecified organism: Secondary | ICD-10-CM | POA: Diagnosis present

## 2016-11-06 DIAGNOSIS — O99343 Other mental disorders complicating pregnancy, third trimester: Secondary | ICD-10-CM | POA: Diagnosis present

## 2016-11-06 DIAGNOSIS — F1110 Opioid abuse, uncomplicated: Secondary | ICD-10-CM | POA: Diagnosis present

## 2016-11-06 DIAGNOSIS — Z87891 Personal history of nicotine dependence: Secondary | ICD-10-CM | POA: Diagnosis not present

## 2016-11-06 DIAGNOSIS — Z3A33 33 weeks gestation of pregnancy: Secondary | ICD-10-CM

## 2016-11-06 DIAGNOSIS — O99323 Drug use complicating pregnancy, third trimester: Secondary | ICD-10-CM | POA: Diagnosis present

## 2016-11-06 DIAGNOSIS — L02512 Cutaneous abscess of left hand: Secondary | ICD-10-CM | POA: Diagnosis present

## 2016-11-06 DIAGNOSIS — F1120 Opioid dependence, uncomplicated: Secondary | ICD-10-CM | POA: Diagnosis present

## 2016-11-06 DIAGNOSIS — G4700 Insomnia, unspecified: Secondary | ICD-10-CM | POA: Diagnosis present

## 2016-11-06 DIAGNOSIS — Z9889 Other specified postprocedural states: Secondary | ICD-10-CM

## 2016-11-06 DIAGNOSIS — O98913 Unspecified maternal infectious and parasitic disease complicating pregnancy, third trimester: Secondary | ICD-10-CM | POA: Diagnosis present

## 2016-11-06 DIAGNOSIS — F419 Anxiety disorder, unspecified: Secondary | ICD-10-CM | POA: Diagnosis present

## 2016-11-06 DIAGNOSIS — O99713 Diseases of the skin and subcutaneous tissue complicating pregnancy, third trimester: Secondary | ICD-10-CM | POA: Diagnosis present

## 2016-11-06 DIAGNOSIS — F1190 Opioid use, unspecified, uncomplicated: Secondary | ICD-10-CM | POA: Diagnosis present

## 2016-11-06 DIAGNOSIS — L0291 Cutaneous abscess, unspecified: Secondary | ICD-10-CM

## 2016-11-06 HISTORY — PX: I&D EXTREMITY: SHX5045

## 2016-11-06 LAB — BASIC METABOLIC PANEL
Anion gap: 8 (ref 5–15)
BUN: 13 mg/dL (ref 6–20)
CO2: 23 mmol/L (ref 22–32)
Calcium: 8.4 mg/dL — ABNORMAL LOW (ref 8.9–10.3)
Chloride: 105 mmol/L (ref 101–111)
Creatinine, Ser: 0.56 mg/dL (ref 0.44–1.00)
GFR calc Af Amer: >60 mL/min (ref >60)
GFR calc non Af Amer: >60 mL/min (ref >60)
Glucose, Bld: 85 mg/dL (ref 65–99)
Potassium: 3.7 mmol/L (ref 3.5–5.1)
Sodium: 136 mmol/L (ref 135–145)

## 2016-11-06 LAB — CBC WITH DIFFERENTIAL/PLATELET
Basophils Absolute: 0 10*3/uL (ref 0.0–0.1)
Basophils Relative: 0 %
Eosinophils Absolute: 0.2 10*3/uL (ref 0.0–0.7)
Eosinophils Relative: 1 %
HCT: 34.6 % — ABNORMAL LOW (ref 36.0–46.0)
Hemoglobin: 11.8 g/dL — ABNORMAL LOW (ref 12.0–15.0)
Lymphocytes Relative: 13 %
Lymphs Abs: 2.2 10*3/uL (ref 0.7–4.0)
MCH: 27.3 pg (ref 26.0–34.0)
MCHC: 34.1 g/dL (ref 30.0–36.0)
MCV: 80.1 fL (ref 78.0–100.0)
Monocytes Absolute: 1.4 10*3/uL — ABNORMAL HIGH (ref 0.1–1.0)
Monocytes Relative: 8 %
Neutro Abs: 13.5 10*3/uL — ABNORMAL HIGH (ref 1.7–7.7)
Neutrophils Relative %: 78 %
Platelets: 226 10*3/uL (ref 150–400)
RBC: 4.32 MIL/uL (ref 3.87–5.11)
RDW: 13.3 % (ref 11.5–15.5)
WBC: 17.3 10*3/uL — ABNORMAL HIGH (ref 4.0–10.5)

## 2016-11-06 LAB — I-STAT CG4 LACTIC ACID, ED: Lactic Acid, Venous: 0.99 mmol/L (ref 0.5–1.9)

## 2016-11-06 SURGERY — IRRIGATION AND DEBRIDEMENT EXTREMITY
Anesthesia: General | Laterality: Left

## 2016-11-06 MED ORDER — ZOLPIDEM TARTRATE 5 MG PO TABS
5.0000 mg | ORAL_TABLET | Freq: Every evening | ORAL | Status: DC | PRN
  Administered 2016-11-07: 5 mg via ORAL
  Filled 2016-11-06: qty 1

## 2016-11-06 MED ORDER — VANCOMYCIN HCL IN DEXTROSE 1-5 GM/200ML-% IV SOLN
1000.0000 mg | Freq: Two times a day (BID) | INTRAVENOUS | Status: DC
  Administered 2016-11-07 – 2016-11-08 (×4): 1000 mg via INTRAVENOUS
  Filled 2016-11-06 (×5): qty 200

## 2016-11-06 MED ORDER — ACETAMINOPHEN 325 MG PO TABS: 650.0000 mg | ORAL_TABLET | ORAL | Status: DC | PRN

## 2016-11-06 MED ORDER — ONDANSETRON HCL 4 MG PO TABS: 4.0000 mg | ORAL_TABLET | Freq: Four times a day (QID) | ORAL | Status: DC | PRN

## 2016-11-06 MED ORDER — ONDANSETRON HCL 4 MG/2ML IJ SOLN
4.0000 mg | Freq: Four times a day (QID) | INTRAMUSCULAR | Status: DC | PRN
Start: 2016-11-06 — End: 2016-11-09

## 2016-11-06 MED ORDER — ROPIVACAINE HCL 5 MG/ML IJ SOLN
INTRAMUSCULAR | Status: DC | PRN
  Administered 2016-11-06: 30 mg via PERINEURAL

## 2016-11-06 MED ORDER — OXYCODONE-ACETAMINOPHEN 5-325 MG PO TABS
1.0000 | ORAL_TABLET | ORAL | Status: DC | PRN
  Administered 2016-11-07 – 2016-11-09 (×10): 2 via ORAL
  Filled 2016-11-06 (×10): qty 2

## 2016-11-06 MED ORDER — PRENATAL MULTIVITAMIN CH
1.0000 | ORAL_TABLET | Freq: Every day | ORAL | Status: DC
  Administered 2016-11-07 – 2016-11-09 (×2): 1 via ORAL
  Filled 2016-11-06 (×2): qty 1

## 2016-11-06 MED ORDER — METHADONE HCL 10 MG/ML PO CONC
95.0000 mg | Freq: Every day | ORAL | Status: DC
  Administered 2016-11-07 – 2016-11-09 (×3): 95 mg via ORAL
  Filled 2016-11-06 (×3): qty 9.5

## 2016-11-06 MED ORDER — CHLORHEXIDINE GLUCONATE 4 % EX LIQD
60.0000 mL | Freq: Once | CUTANEOUS | Status: DC
  Filled 2016-11-06: qty 60

## 2016-11-06 MED ORDER — FENTANYL CITRATE (PF) 100 MCG/2ML IJ SOLN
100.0000 ug | Freq: Once | INTRAMUSCULAR | Status: AC
  Administered 2016-11-06: 100 ug via INTRAVENOUS
  Filled 2016-11-06: qty 2

## 2016-11-06 MED ORDER — ONDANSETRON HCL 4 MG/2ML IJ SOLN: 4.0000 mg | Freq: Once | INTRAMUSCULAR | Status: DC | PRN

## 2016-11-06 MED ORDER — SODIUM CHLORIDE 0.9 % IR SOLN
Status: DC | PRN
  Administered 2016-11-06: 1000 mL

## 2016-11-06 MED ORDER — FENTANYL CITRATE (PF) 100 MCG/2ML IJ SOLN
INTRAMUSCULAR | Status: DC | PRN
  Administered 2016-11-06 (×2): 50 ug via INTRAVENOUS

## 2016-11-06 MED ORDER — MEPERIDINE HCL 25 MG/ML IJ SOLN: 6.2500 mg | INTRAMUSCULAR | Status: DC | PRN

## 2016-11-06 MED ORDER — FENTANYL CITRATE (PF) 100 MCG/2ML IJ SOLN
INTRAMUSCULAR | Status: AC
  Administered 2016-11-06: 100 ug via INTRAVENOUS
  Filled 2016-11-06: qty 2

## 2016-11-06 MED ORDER — CEFAZOLIN SODIUM-DEXTROSE 2-4 GM/100ML-% IV SOLN
2.0000 g | INTRAVENOUS | Status: AC
  Administered 2016-11-06: 2 g via INTRAVENOUS
  Filled 2016-11-06: qty 100

## 2016-11-06 MED ORDER — DOCUSATE SODIUM 100 MG PO CAPS
100.0000 mg | ORAL_CAPSULE | Freq: Every day | ORAL | Status: DC
  Administered 2016-11-07 – 2016-11-09 (×2): 100 mg via ORAL
  Filled 2016-11-06 (×2): qty 1

## 2016-11-06 MED ORDER — FAMOTIDINE IN NACL 20-0.9 MG/50ML-% IV SOLN
20.0000 mg | Freq: Once | INTRAVENOUS | Status: AC
  Administered 2016-11-06: 20 mg via INTRAVENOUS
  Filled 2016-11-06: qty 50

## 2016-11-06 MED ORDER — FENTANYL CITRATE (PF) 100 MCG/2ML IJ SOLN: 25.0000 ug | INTRAMUSCULAR | Status: DC | PRN

## 2016-11-06 MED ORDER — BUPIVACAINE HCL (PF) 0.5 % IJ SOLN
INTRAMUSCULAR | Status: AC
  Filled 2016-11-06: qty 30

## 2016-11-06 MED ORDER — POVIDONE-IODINE 10 % EX SWAB: 2.0000 "application " | Freq: Once | CUTANEOUS | Status: DC

## 2016-11-06 MED ORDER — PROPOFOL 10 MG/ML IV BOLUS
INTRAVENOUS | Status: AC
  Filled 2016-11-06: qty 20

## 2016-11-06 MED ORDER — FENTANYL CITRATE (PF) 250 MCG/5ML IJ SOLN
INTRAMUSCULAR | Status: AC
  Filled 2016-11-06: qty 5

## 2016-11-06 MED ORDER — LACTATED RINGERS IV SOLN
INTRAVENOUS | Status: DC
  Administered 2016-11-06: 17:00:00 via INTRAVENOUS

## 2016-11-06 MED ORDER — PROPOFOL 500 MG/50ML IV EMUL
INTRAVENOUS | Status: DC | PRN
  Administered 2016-11-06: 100 ug/kg/min via INTRAVENOUS

## 2016-11-06 SURGICAL SUPPLY — 59 items
BANDAGE ACE 3X5.8 VEL STRL LF (GAUZE/BANDAGES/DRESSINGS) IMPLANT
BANDAGE ACE 4X5 VEL STRL LF (GAUZE/BANDAGES/DRESSINGS) IMPLANT
BANDAGE ELASTIC 3 VELCRO ST LF (GAUZE/BANDAGES/DRESSINGS) ×3 IMPLANT
BANDAGE ELASTIC 4 VELCRO ST LF (GAUZE/BANDAGES/DRESSINGS) ×3 IMPLANT
BNDG COHESIVE 1X5 TAN STRL LF (GAUZE/BANDAGES/DRESSINGS) IMPLANT
BNDG CONFORM 2 STRL LF (GAUZE/BANDAGES/DRESSINGS) IMPLANT
BNDG ESMARK 4X9 LF (GAUZE/BANDAGES/DRESSINGS) ×3 IMPLANT
BNDG GAUZE ELAST 4 BULKY (GAUZE/BANDAGES/DRESSINGS) ×3 IMPLANT
CORDS BIPOLAR (ELECTRODE) ×3 IMPLANT
COVER SURGICAL LIGHT HANDLE (MISCELLANEOUS) ×3 IMPLANT
CUFF TOURNIQUET SINGLE 18IN (TOURNIQUET CUFF) ×3 IMPLANT
CUFF TOURNIQUET SINGLE 24IN (TOURNIQUET CUFF) IMPLANT
DRAIN PENROSE 1/4X12 LTX STRL (WOUND CARE) IMPLANT
DRAPE SURG 17X23 STRL (DRAPES) ×3 IMPLANT
DRSG ADAPTIC 3X8 NADH LF (GAUZE/BANDAGES/DRESSINGS) ×3 IMPLANT
ELECT REM PT RETURN 9FT ADLT (ELECTROSURGICAL)
ELECTRODE REM PT RTRN 9FT ADLT (ELECTROSURGICAL) IMPLANT
GAUZE SPONGE 4X4 12PLY STRL (GAUZE/BANDAGES/DRESSINGS) IMPLANT
GAUZE SPONGE 4X4 12PLY STRL LF (GAUZE/BANDAGES/DRESSINGS) ×3 IMPLANT
GAUZE XEROFORM 1X8 LF (GAUZE/BANDAGES/DRESSINGS) IMPLANT
GAUZE XEROFORM 5X9 LF (GAUZE/BANDAGES/DRESSINGS) IMPLANT
GLOVE BIOGEL PI IND STRL 8.5 (GLOVE) ×1 IMPLANT
GLOVE BIOGEL PI INDICATOR 8.5 (GLOVE) ×2
GLOVE SURG ORTHO 8.0 STRL STRW (GLOVE) ×3 IMPLANT
GOWN STRL REUS W/ TWL LRG LVL3 (GOWN DISPOSABLE) ×3 IMPLANT
GOWN STRL REUS W/ TWL XL LVL3 (GOWN DISPOSABLE) ×1 IMPLANT
GOWN STRL REUS W/TWL LRG LVL3 (GOWN DISPOSABLE) ×6
GOWN STRL REUS W/TWL XL LVL3 (GOWN DISPOSABLE) ×2
HANDPIECE INTERPULSE COAX TIP (DISPOSABLE)
KIT BASIN OR (CUSTOM PROCEDURE TRAY) ×3 IMPLANT
KIT ROOM TURNOVER OR (KITS) ×3 IMPLANT
MANIFOLD NEPTUNE II (INSTRUMENTS) ×3 IMPLANT
NEEDLE HYPO 25GX1X1/2 BEV (NEEDLE) IMPLANT
NS IRRIG 1000ML POUR BTL (IV SOLUTION) ×3 IMPLANT
PACK ORTHO EXTREMITY (CUSTOM PROCEDURE TRAY) ×3 IMPLANT
PAD ARMBOARD 7.5X6 YLW CONV (MISCELLANEOUS) ×6 IMPLANT
PAD CAST 4YDX4 CTTN HI CHSV (CAST SUPPLIES) IMPLANT
PADDING CAST ABS 4INX4YD NS (CAST SUPPLIES) ×2
PADDING CAST ABS COTTON 4X4 ST (CAST SUPPLIES) ×1 IMPLANT
PADDING CAST COTTON 4X4 STRL (CAST SUPPLIES)
SET HNDPC FAN SPRY TIP SCT (DISPOSABLE) IMPLANT
SOAP 2 % CHG 4 OZ (WOUND CARE) ×3 IMPLANT
SPONGE LAP 18X18 X RAY DECT (DISPOSABLE) ×3 IMPLANT
SPONGE LAP 4X18 X RAY DECT (DISPOSABLE) IMPLANT
SUCTION FRAZIER HANDLE 10FR (MISCELLANEOUS) ×2
SUCTION TUBE FRAZIER 10FR DISP (MISCELLANEOUS) ×1 IMPLANT
SUT ETHILON 4 0 PS 2 18 (SUTURE) IMPLANT
SUT ETHILON 5 0 P 3 18 (SUTURE)
SUT NYLON ETHILON 5-0 P-3 1X18 (SUTURE) IMPLANT
SUT PROLENE 3 0 PS 2 (SUTURE) ×3 IMPLANT
SWAB CULTURE ESWAB REG 1ML (MISCELLANEOUS) IMPLANT
SYR CONTROL 10ML LL (SYRINGE) IMPLANT
TOWEL OR 17X24 6PK STRL BLUE (TOWEL DISPOSABLE) ×3 IMPLANT
TOWEL OR 17X26 10 PK STRL BLUE (TOWEL DISPOSABLE) ×3 IMPLANT
TUBE CONNECTING 12'X1/4 (SUCTIONS) ×1
TUBE CONNECTING 12X1/4 (SUCTIONS) ×2 IMPLANT
UNDERPAD 30X30 (UNDERPADS AND DIAPERS) ×3 IMPLANT
WATER STERILE IRR 1000ML POUR (IV SOLUTION) ×3 IMPLANT
YANKAUER SUCT BULB TIP NO VENT (SUCTIONS) ×3 IMPLANT

## 2016-11-06 NOTE — Anesthesia Preprocedure Evaluation (Signed)
Anesthesia Evaluation  Patient identified by MRN, date of birth, ID band Patient awake    Reviewed: Allergy & Precautions, NPO status , Patient's Chart, lab work & pertinent test results  History of Anesthesia Complications Negative for: history of anesthetic complications  Airway Mallampati: II  TM Distance: >3 FB Neck ROM: Full    Dental no notable dental hx. (+) Dental Advisory Given   Pulmonary neg pulmonary ROS, former smoker,    Pulmonary exam normal        Cardiovascular negative cardio ROS Normal cardiovascular exam     Neuro/Psych PSYCHIATRIC DISORDERS Anxiety Depression negative neurological ROS  negative psych ROS   GI/Hepatic negative GI ROS, (+)     substance abuse  IV drug use,   Endo/Other  negative endocrine ROS  Renal/GU negative Renal ROS  negative genitourinary   Musculoskeletal negative musculoskeletal ROS (+)   Abdominal   Peds negative pediatric ROS (+)  Hematology negative hematology ROS (+)   Anesthesia Other Findings   Reproductive/Obstetrics negative OB ROS (+) Pregnancy                             Anesthesia Physical  Anesthesia Plan  ASA: II  Anesthesia Plan: Regional   Post-op Pain Management:    Induction:   PONV Risk Score and Plan:   Airway Management Planned: Natural Airway  Additional Equipment:   Intra-op Plan:   Post-operative Plan:   Informed Consent: I have reviewed the patients History and Physical, chart, labs and discussed the procedure including the risks, benefits and alternatives for the proposed anesthesia with the patient or authorized representative who has indicated his/her understanding and acceptance.   Dental advisory given  Plan Discussed with:   Anesthesia Plan Comments: (Patient is [redacted] weeks pregnant.  I discussed the case with Dr. Mindi Slicker, who feels the patient needs preop and post op NSTs.  Preop NST was  acceptable for surgery.  She recommended regional anesthesia if possible for the care of the child.  I discussed this with patient who understands the risk to her pregnancy.)        Anesthesia Quick Evaluation

## 2016-11-06 NOTE — Transfer of Care (Signed)
Immediate Anesthesia Transfer of Care Note  Patient: Lauren Allen  Procedure(s) Performed: Procedure(s): IRRIGATION AND DEBRIDEMENT LEFT HAND (Left)  Patient Location: PACU  Anesthesia Type:MAC and Regional  Level of Consciousness: awake, alert  and oriented  Airway & Oxygen Therapy: Patient Spontanous Breathing  Post-op Assessment: Report given to RN and Post -op Vital signs reviewed and stable  Post vital signs: Reviewed and stable  Last Vitals:  Vitals:   11/06/16 1839 11/06/16 1840  BP:    Pulse: 87 99  Resp: 17 18  Temp:      Last Pain:  Vitals:   11/06/16 1658  TempSrc:   PainSc: 9       Patients Stated Pain Goal: 3 (38/33/38 3291)  Complications: No apparent anesthesia complications

## 2016-11-06 NOTE — Progress Notes (Signed)
RROB called to patient's bedside in PACU after surgery; EFM applied and assessing at this time

## 2016-11-06 NOTE — ED Notes (Signed)
Report given to Shanda Bumps, RN at [facility]. Surgery scheduled for 1700 and needs to be at Short Stay at 1600 today.

## 2016-11-06 NOTE — ED Notes (Signed)
A call placed to Lauren Allen at [facility] and informed of surgery time. Patient to be picked up at 1515 today.

## 2016-11-06 NOTE — OR Nursing (Signed)
Confidentially, pt stated she did take 95mg  of Methadone at 0530 this morning.  Mother and father are at the bedside.

## 2016-11-06 NOTE — ED Notes (Signed)
Lauren Allen from [facility] OR called and stated that surgery would be approx 1700 today and needed to be at [facility] around 1600 today. EDPA notified.

## 2016-11-06 NOTE — ED Provider Notes (Signed)
[facility] DEPT Provider Note   CSN: [id number] Arrival date & time: 11/06/16  1975     History   Chief Complaint Chief Complaint  Patient presents with  . Abscess  . Cellulitis    HPI Lauren Allen is a 26 y.o. female with history of heroin abuse, and 33 weeks pregnancy who presents with a one-week history of left hand pain. Patient reports she began with a small area of swelling and redness about one week ago. She was seen 2 days ago at [facility] and given Keflex for suspected infection. She has been taking Keflex as prescribed. She has had progressive swelling of the area as well as extension of swelling to her digits. Patient has not been able to fully range her fingers over the past few days. Patient went to the methadone clinic today and showed the staff, and she was told to come to the emergency department immediately. Patient reports having a relapse last week prior to symptom onset and injected in the area of now infection. She has not used since. She denies any fevers, chest pain, shortness of breath, new nausea, vomiting, abdominal pain, numbness/tingling or any other symptoms. Patient has had a prenatal visit last week and had no complications.  HPI  Past Medical History:  Diagnosis Date  . Anxiety   . Crohn's disease ([place])   . Depression   . Headache(784.0)   . Ileitis   . Opiate addiction [facility])     Patient Active Problem List   Diagnosis Date Noted  . Heroin abuse 09/13/2016  . ANXIETY 06/30/2009  . DEPRESSION 06/30/2009    Past Surgical History:  Procedure Laterality Date  . COLONOSCOPY    . TONSILLECTOMY      OB History    Gravida Para Term Preterm AB Living   1 0 0 0 0 0   SAB TAB Ectopic Multiple Live Births                   Home Medications    Prior to Admission medications   Medication Sig Start Date End Date Taking? Authorizing Provider  cephALEXin (KEFLEX) 500 MG capsule Take 500 mg by mouth 4 (four) times  daily.   Yes [provider]  hydrocortisone cream 1 % Apply 1 application topically 2 (two) times daily as needed for itching (bug bites).    Yes [provider]  ibuprofen (ADVIL,MOTRIN) 200 MG tablet Take 600 mg by mouth every 6 (six) hours as needed (pain in left hand).   Yes [provider]  methadone (DOLOPHINE) 10 MG/ML solution Take 95 mg by mouth daily.   Yes [provider]  oxymetazoline (AFRIN) 0.05 % nasal spray Place 2 sprays into both nostrils 2 (two) times daily as needed for congestion.   Yes [provider]  Prenatal Vit-Fe Fumarate-FA (PRENATAL COMPLETE) 14-0.4 MG TABS Take 1 tablet by mouth daily. 09/13/16  Yes Duffy Bruce, MD  RaNITidine HCl (ZANTAC PO) Take 1 tablet by mouth daily.   Yes [provider]  budesonide (ENTOCORT EC) 3 MG 24 hr capsule Take 3 capsules (9 mg total) by mouth daily. Patient not taking: Reported on 09/13/2016 05/19/13   Ladene Artist, MD  Na Sulfate-K Sulfate-Mg Sulf SOLN Take 1 kit by mouth once. Patient not taking: Reported on 09/13/2016 08/28/15   Mauri Pole, MD  omeprazole (PRILOSEC) 20 MG capsule Take 1 capsule (20 mg total) by mouth daily. Patient not taking: Reported on 09/13/2016  05/19/13   Ladene Artist, MD    Family History Family History  Problem Relation Age of Onset  . Colon cancer Maternal Grandfather   . Celiac disease Maternal Aunt   . Diabetes Maternal Grandmother   . Heart disease Unknown     Social History Social History  Substance Use Topics  . Smoking status: Former Smoker    Packs/day: 0.50    Quit date: 09/05/2016  . Smokeless tobacco: Never Used  . Alcohol use Yes     Comment: occasional     Allergies   Sulfonamide derivatives and Sulfa antibiotics   Review of Systems Review of Systems  Constitutional: Negative for fever.  HENT: Negative for facial swelling.   Respiratory: Negative for shortness of breath.   Cardiovascular: Negative for  chest pain.  Gastrointestinal: Positive for nausea (some acid reflux from pregnancy). Negative for abdominal pain and vomiting.  Genitourinary: Negative for dysuria.  Musculoskeletal: Positive for arthralgias and joint swelling.  Skin: Positive for rash and wound.  Neurological: Negative for numbness.  Psychiatric/Behavioral: The patient is not nervous/anxious.      Physical Exam Updated Vital Signs BP 110/78 (BP Location: Right Arm)   Pulse 97   Temp 97.6 F (36.4 C) (Oral)   Resp 16   Ht _0  (1.575 m)   Wt 72.1 kg (159 lb)   LMP 01/24/2016   SpO2 99%   BMI 29.08 kg/m   Physical Exam  Constitutional: She appears well-developed and well-nourished. No distress.  HENT:  Head: Normocephalic and atraumatic.  Mouth/Throat: Oropharynx is clear and moist. No oropharyngeal exudate.  Eyes: Pupils are equal, round, and reactive to light. Conjunctivae are normal. Right eye exhibits no discharge. Left eye exhibits no discharge. No scleral icterus.  Neck: Normal range of motion. Neck supple. No thyromegaly present.  Cardiovascular: Normal rate, regular rhythm, normal heart sounds and intact distal pulses.  Exam reveals no gallop and no friction rub.   No murmur heard. Pulmonary/Chest: Effort normal and breath sounds normal. No stridor. No respiratory distress. She has no wheezes. She has no rales.  Abdominal: Soft. Bowel sounds are normal. She exhibits no distension. There is no tenderness. There is no rebound and no guarding.  Musculoskeletal: She exhibits no edema.       Right hand: She exhibits decreased range of motion, tenderness and swelling.  Patient with fluctuant abscess to dorsal hand; edema extending to the second and third digits, and fourth and fifth digits less extensively; pain and limited range of motion with range of motion digits; normal sensation; radial pulses intact  Lymphadenopathy:    She has no cervical adenopathy.  Neurological: She is alert. Coordination normal.   Skin: Skin is warm and dry. No rash noted. She is not diaphoretic. No pallor.  Psychiatric: She has a normal mood and affect.  Nursing note and vitals reviewed.      ED Treatments / Results  Labs (all labs ordered are listed, but only abnormal results are displayed) Labs Reviewed  BASIC METABOLIC PANEL - Abnormal; Notable for the following:       Result Value   Calcium 8.4 (*)    All other components within normal limits  CBC WITH DIFFERENTIAL/PLATELET - Abnormal; Notable for the following:    WBC 17.3 (*)    Hemoglobin 11.8 (*)    HCT 34.6 (*)    Neutro Abs 13.5 (*)    Monocytes Absolute 1.4 (*)    All other components within normal limits  CULTURE, BLOOD (ROUTINE X 2)  CULTURE, BLOOD (ROUTINE X 2)  I-STAT CG4 LACTIC ACID, ED    EKG  EKG Interpretation None       Radiology Dg Hand Complete Left  Result Date: 11/06/2016 CLINICAL DATA:  26 year old female with abscess left hand. Swollen and red. Initial encounter. EXAM: LEFT HAND - COMPLETE 3+ VIEW COMPARISON:  None. FINDINGS: Prominent diffuse soft tissue swelling most notable dorsum of the hand and between the first and second metacarpal. No plain film evidence of osteomyelitis or underlying fracture. IMPRESSION: Prominent soft tissue swelling suggestive of cellulitis without plain film evidence of osteomyelitis. Electronically Signed   By: Genia Del M.D.   On: 11/06/2016 07:27    Procedures Procedures (including critical care time)  Medications Ordered in ED Medications  chlorhexidine (HIBICLENS) 4 % liquid 4 application (not administered)  povidone-iodine 10 % swab 2 application (not administered)  ceFAZolin (ANCEF) IVPB 2g/100 mL premix (not administered)  famotidine (PEPCID) IVPB 20 mg premix (0 mg Intravenous Stopped 11/06/16 1012)     Initial Impression / Assessment and Plan / ED Course  I have reviewed the triage vital signs and the nursing notes.  Pertinent labs & imaging results that were  available during my care of the patient were reviewed by me and considered in my medical decision making (see chart for details).     Patient with abscess to left hand after IV heroin injection. Patient has had 2 days of Keflex thus far. Concern for deep tissue involvement considering limited range of motion of fingers. CBC shows WBC 17.3, he vomited 11.8. Lactate 0.99. Blood culture sent. No signs of osteomyelitis on x-ray. Hand surgery was consulted and patient was evaluated by Silvestre Gunner, PA-C who consulted with Dr. Caralyn Guile, hand surgeon, who will operate on the patient today. Patient will be transferred to [facility] for surgery later this afternoon. Patient made nothing by mouth. Given IV Pepcid for acid reflux symptoms. Patient also evaluated by Dr. Rex Kras who guided the patient's management and agrees with plan. Patient vitals stable throughout ED course.  Final Clinical Impressions(s) / ED Diagnoses   Final diagnoses:  Abscess    New Prescriptions New Prescriptions   No medications on file     Frederica Kuster, Hershal Coria 11/06/16 1412    Orpah Greek, MD 11/07/16 [phone]

## 2016-11-06 NOTE — ED Triage Notes (Signed)
Pt reports that she developed an abscess to left hand two weeks ago and was started on Keflex Saturday when seen at [facility] ED. Pt states that swelling has gotten worse since then. Area red and scabbed to left posterior hand.

## 2016-11-06 NOTE — Consult Note (Signed)
Reason for Consult: Left hand abscess Referring Physician: Emergency  Department  Lauren Allen is an 26 y.o. female.  [id number] Lauren Allen is a 26 y.o. female with history of heroin abuse, and 33 weeks pregnancy who presents with a one-week history of left hand pain. Patient reports she began with a small area of swelling and redness about one week ago. She was seen 2 days ago at [facility] and given Keflex for suspected infection. She has been taking Keflex as prescribed. She has had progressive swelling of the area as well as extension of swelling to her digits. Patient has not been able to fully range her fingers over the past few days. Patient went to the methadone clinic today and showed the staff, and she was told to come to the emergency department immediately. Patient reports having a relapse last week prior to symptom onset and injected in the area of now infection. She has not used since. She denies any fevers, chest pain, shortness of breath, new nausea, vomiting, abdominal pain, numbness/tingling or any other symptoms. Patient has had a prenatal visit last week and had no complications.  Past Medical History:  Diagnosis Date  . Anxiety   . Crohn's disease ([place])   . Depression   . Headache(784.0)   . Ileitis   . Opiate addiction [facility])     Past Surgical History:  Procedure Laterality Date  . COLONOSCOPY    . TONSILLECTOMY      Family History  Problem Relation Age of Onset  . Colon cancer Maternal Grandfather   . Celiac disease Maternal Aunt   . Diabetes Maternal Grandmother   . Heart disease Unknown     Social History:  reports that she quit smoking about 2 months ago. She smoked 0.50 packs per day. She has never used smokeless tobacco. She reports that she drinks alcohol. She reports that she uses drugs.  Allergies:  Allergies  Allergen Reactions  . Sulfonamide Derivatives Nausea And Vomiting  . Sulfa Antibiotics Palpitations    tachycardia     Medications: I have reviewed the patient's current medications.  Results for orders placed or performed during the hospital encounter of 11/06/16 (from the past 48 hour(s))  I-Stat CG4 Lactic Acid, ED     Status: None   Collection Time: 11/06/16  7:02 AM  Result Value Ref Range   Lactic Acid, Venous 0.99 0.5 - 1.9 mmol/L  Basic metabolic panel     Status: Abnormal   Collection Time: 11/06/16  7:05 AM  Result Value Ref Range   Sodium 136 135 - 145 mmol/L   Potassium 3.7 3.5 - 5.1 mmol/L   Chloride 105 101 - 111 mmol/L   CO2 23 22 - 32 mmol/L   Glucose, Bld 85 65 - 99 mg/dL   BUN 13 6 - 20 mg/dL   Creatinine, Ser 0.56 0.44 - 1.00 mg/dL   Calcium 8.4 (L) 8.9 - 10.3 mg/dL   GFR calc non Af Amer >60 >60 mL/min   GFR calc Af Amer >60 >60 mL/min    Comment: (NOTE) The eGFR has been calculated using the CKD EPI equation. This calculation has not been validated in all clinical situations. eGFR's persistently <60 mL/min signify possible Chronic Kidney Disease.    Anion gap 8 5 - 15  CBC with Differential     Status: Abnormal   Collection Time: 11/06/16  7:05 AM  Result Value Ref Range   WBC 17.3 (H) 4.0 - 10.5 K/uL  RBC 4.32 3.87 - 5.11 MIL/uL   Hemoglobin 11.8 (L) 12.0 - 15.0 g/dL   HCT 34.6 (L) 36.0 - 46.0 %   MCV 80.1 78.0 - 100.0 fL   MCH 27.3 26.0 - 34.0 pg   MCHC 34.1 30.0 - 36.0 g/dL   RDW 13.3 11.5 - 15.5 %   Platelets 226 150 - 400 K/uL   Neutrophils Relative % 78 %   Lymphocytes Relative 13 %   Monocytes Relative 8 %   Eosinophils Relative 1 %   Basophils Relative 0 %   Neutro Abs 13.5 (H) 1.7 - 7.7 K/uL   Lymphs Abs 2.2 0.7 - 4.0 K/uL   Monocytes Absolute 1.4 (H) 0.1 - 1.0 K/uL   Eosinophils Absolute 0.2 0.0 - 0.7 K/uL   Basophils Absolute 0.0 0.0 - 0.1 K/uL   Smear Review MORPHOLOGY UNREMARKABLE     Dg Hand Complete Left  Result Date: 11/06/2016 CLINICAL DATA:  26 year old female with abscess left hand. Swollen and red. Initial encounter. EXAM: LEFT HAND  - COMPLETE 3+ VIEW COMPARISON:  None. FINDINGS: Prominent diffuse soft tissue swelling most notable dorsum of the hand and between the first and second metacarpal. No plain film evidence of osteomyelitis or underlying fracture. IMPRESSION: Prominent soft tissue swelling suggestive of cellulitis without plain film evidence of osteomyelitis. Electronically Signed   By: Genia Del M.D.   On: 11/06/2016 07:27    ROS as noted in the medical record Blood pressure (!) 104/58, pulse 89, temperature 98.3 F (36.8 C), temperature source Oral, resp. rate 18, height 5' 2"  (1.575 m), weight 159 lb (72.1 kg), last menstrual period 01/24/2016, SpO2 98 %. Physical Exam  General Appearance:  Alert, cooperative, no distress, appears stated age  Head:  Normocephalic, without obvious abnormality, atraumatic  Eyes:  Pupils equal, conjunctiva/corneas clear,         Throat: Lips, mucosa, and tongue normal; teeth and gums normal  Neck: No visible masses     Lungs:   respirations unlabored  Chest Wall:  No tenderness or deformity  Heart:  Regular rate and rhythm,  Abdomen:   Pregnant         Extremities: Patient is a large dorsal abscess directly between the index and thumb webspace with very tense skin and surrounding erythema the abscess area measures more than 6 x 6 cm with some mild ascending erythema and lymphangitis   Pulses: 2+ and symmetric  Skin: Skin color, texture, turgor normal, no rashes or lesions     Neurologic: Normal    Assessment/Plan: Intravenous drug use Intrauterine pregnancy Large dorsal hand abscess  The patient relapsed and began using drugs again injecting needles into the dorsal aspect of her hand. The patient has a very significant abscess of the dorsum of the hand. It is my recommendation that the patient undergo emergent incision and drainage of the dorsum of the hand. The patient will need to be admitted to the internal medicine service, hospitalist service after surgery for  management of the infection opioid dependence and also consultation with obstetrics for the monitoring of the intrauterine pregnancy. Patient will likely is going to require repeat I&D within 48 hours given the severity of the infection. This is explained in detail to the patient the reason the risks of surgery which include persistent infection loss of the hand.  WE ARE PLANNING SURGERY FOR YOUR UPPER EXTREMITY. THE RISKS AND BENEFITS OF SURGERY INCLUDE BUT NOT LIMITED TO BLEEDING INFECTION, DAMAGE TO NEARBY NERVES ARTERIES TENDONS,  FAILURE OF SURGERY TO ACCOMPLISH ITS INTENDED GOALS, PERSISTENT SYMPTOMS AND NEED FOR FURTHER SURGICAL INTERVENTION. WITH THIS IN MIND WE WILL PROCEED. I HAVE DISCUSSED WITH THE PATIENT THE PRE AND POSTOPERATIVE REGIMEN AND THE DOS AND DON'TS. PT VOICED UNDERSTANDING AND INFORMED CONSENT SIGNED.  Linna Hoff 11/06/2016, 6:19 PM

## 2016-11-06 NOTE — Progress Notes (Signed)
Spoke with Dr. Mindi Slicker. Pre op NST is reactive. Pt may be hospitalized until 11/08/2016. If pt is hospitalized, she will need daily NST's.

## 2016-11-06 NOTE — ED Notes (Signed)
Pt reports to being [redacted] weeks pregnant with first pregnancy. Denies any pregnancy related symptoms.

## 2016-11-06 NOTE — Progress Notes (Signed)
RR OB RN called about surgery time and arrival time of the patient.  RR OB RN stated that she may just need to be monitored before surgery and then after surgery depending on the type of anesthesia

## 2016-11-06 NOTE — Progress Notes (Signed)
Dr Mindi Slicker called and notified of patient's NST and current status; due to patient's drug use and post-op status orders have been given for patient to be transferred to [facility] hospital at this time; arrangements being made for patient to be transferred at this time

## 2016-11-06 NOTE — Anesthesia Preprocedure Evaluation (Addendum)
Anesthesia Evaluation  Patient identified by MRN, date of birth, ID band Patient awake    Reviewed: Allergy & Precautions, NPO status , Patient's Chart, lab work & pertinent test results  Airway Mallampati: II  TM Distance: >3 FB Neck ROM: Full    Dental no notable dental hx.    Pulmonary neg pulmonary ROS, former smoker,    Pulmonary exam normal breath sounds clear to auscultation       Cardiovascular negative cardio ROS Normal cardiovascular exam Rhythm:Regular Rate:Normal     Neuro/Psych negative neurological ROS  negative psych ROS   GI/Hepatic negative GI ROS, Neg liver ROS,   Endo/Other  negative endocrine ROS  Renal/GU negative Renal ROS  negative genitourinary   Musculoskeletal negative musculoskeletal ROS (+)   Abdominal   Peds negative pediatric ROS (+)  Hematology negative hematology ROS (+)   Anesthesia Other Findings ON METHADONE 95 mg qd.  History of heroine abuse  Reproductive/Obstetrics negative OB ROS                            Anesthesia Physical Anesthesia Plan  ASA: II and emergent  Anesthesia Plan: General   Post-op Pain Management:    Induction: Intravenous  PONV Risk Score and Plan: 2 and Ondansetron, Dexamethasone and Treatment may vary due to age or medical condition  Airway Management Planned: LMA  Additional Equipment:   Intra-op Plan:   Post-operative Plan:   Informed Consent: I have reviewed the patients History and Physical, chart, labs and discussed the procedure including the risks, benefits and alternatives for the proposed anesthesia with the patient or authorized representative who has indicated his/her understanding and acceptance.   Dental advisory given  Plan Discussed with: CRNA and Surgeon  Anesthesia Plan Comments: ( )        Anesthesia Quick Evaluation

## 2016-11-06 NOTE — Progress Notes (Signed)
[facility] called and arrangements made for transport; [facility] enroute at this time

## 2016-11-06 NOTE — Progress Notes (Signed)
Orthopedic Tech Progress Note Patient Details:  Lauren Allen 14-May-1990 [id number]  Ortho Devices Type of Ortho Device: Arm sling Ortho Device/Splint Location: LUE Ortho Device/Splint Interventions: Ordered, Application   Jennye Moccasin 11/06/2016, 8:37 PM

## 2016-11-06 NOTE — H&P (Addendum)
Lauren Allen is a 26 y.o. female presenting for admission. Pt is a prime at [redacted]weeks gestation. She is currently on methadone 95mg  daily at 5am. She has had relapses in this pregnancy ( at 4months and at 6months). She was on suboxone initially but was unable to afford. Pt admits to injecting with heroin a week ago ( 7/10) and subsequently developing a bump....she took keflex she had been prescribed for what she reported as a cat scratch but did not improve. She reported this to staff at methadone clinic today who advised her to go to ER at [facility]. She was subsequently admitted for surgical debridement of abcess. She is currently doing well post op- has some anxiety; pain well controlled. +Fms. Of note pt had cellulitis in same arm in two months ago. FOB present and supportive. Family not fully aware of all social factors  OB History    Gravida Para Term Preterm AB Living   1 0 0 0 0 0   SAB TAB Ectopic Multiple Live Births                 Past Medical History:  Diagnosis Date  . Anxiety   . Crohn's disease ([facility])   . Depression   . Headache(784.0)   . Ileitis   . Opiate addiction [facility])    Past Surgical History:  Procedure Laterality Date  . COLONOSCOPY    . TONSILLECTOMY     Family History: family history includes Celiac disease in her maternal aunt; Colon cancer in her maternal grandfather; Diabetes in her maternal grandmother; Heart disease in her unknown relative. Social History:  reports that she quit smoking about 2 months ago. She smoked 0.50 packs per day. She has never used smokeless tobacco. She reports that she drinks alcohol. She reports that she uses drugs.     Maternal Diabetes: No Genetic Screening: Normal Maternal Ultrasounds/Referrals: Normal Fetal Ultrasounds or other Referrals:  None Maternal Substance Abuse:  Yes:  Type: Methadone, Other:  heroin Significant Maternal Medications:  Meds include: Other: methadone 95mg  Significant Maternal Lab Results:  None Other  Comments:  None  Review of Systems  Constitutional: Negative for chills, fever, malaise/fatigue and weight loss.  HENT: Negative for hearing loss.   Eyes: Negative for blurred vision and double vision.  Respiratory: Negative for shortness of breath.   Cardiovascular: Negative for chest pain and palpitations.  Gastrointestinal: Negative for abdominal pain, heartburn, nausea and vomiting.  Genitourinary: Negative for dysuria.  Musculoskeletal: Negative for back pain and myalgias.  Skin: Negative for itching and rash.       Left hand with dressing   Neurological: Negative for dizziness, tingling and headaches.  Psychiatric/Behavioral: Positive for substance abuse. Negative for depression, hallucinations and suicidal ideas. The patient is nervous/anxious.    Maternal Medical History:  Reason for admission: Nausea. Post op hand surgery; s/p heroine use; on methadone; antepartum  Fetal activity: Perceived fetal activity is normal.   Last perceived fetal movement was within the past hour.        Blood pressure 110/68, pulse (!) 116, temperature (!) 97.3 F (36.3 C), resp. rate 18, height 5\' 2"  (1.575 m), weight 159 lb (72.1 kg), last menstrual period 01/24/2016, SpO2 99 %. Exam Physical Exam  Prenatal labs: ABO, Rh:   Antibody:   Rubella:   RPR:    HBsAg:    HIV:    GBS:     Assessment/Plan: G1P0 at 35 2/[redacted]wks gestation on methadone; s/p heroin use a week ago;  post op hand surgery - currently stable Continuous fetal monitoring - cat 1 strip; baseline 120s,                                                 irritability only on toco Counseled on risk of abruption IV vancomycin for mgmt of cellulitis post op Pain control- percocet and tylenol Ambien for insomnia/anxiety LR overnight Regular diet at this time Methadone 95mg  po daily - confirm with clinic in am [facility] OR on 7/18 per orders to be given by Dr Melvyn Novas ( hand surgeon)    Janean Sark Tristian Bouska 11/06/2016, 11:25 PM

## 2016-11-06 NOTE — Anesthesia Procedure Notes (Signed)
Anesthesia Regional Block: Supraclavicular block   Pre-Anesthetic Checklist: ,, timeout performed, Correct Patient, Correct Site, Correct Laterality, Correct Procedure,, site marked, risks and benefits discussed, Surgical consent,  Pre-op evaluation,  At surgeon's request and post-op pain management  Laterality: Left  Prep: chloraprep       Needles:  Injection technique: Single-shot  Needle Type: Echogenic Stimulator Needle     Needle Length: 5cm  Needle Gauge: 22     Additional Needles:   Procedures: ultrasound guided, nerve stimulator,,,,,,   Nerve Stimulator or Paresthesia:  Response: bicep contraction, 0.48 mA,   Additional Responses:   Narrative:  Start time: 11/06/2016 6:34 PM End time: 11/06/2016 6:44 PM Injection made incrementally with aspirations every 5 mL.  Performed by: Personally   Additional Notes: Functioning IV was confirmed and monitors applied.  A 50mm 22ga echogenic arrow stimulator was used. Sterile prep and drape,hand hygiene and sterile gloves were used.Ultrasound guidance: relevent anatomy identified, needle position confirmed, local anesthetic spread visualized around nerve(s)., vascular puncture avoided.  Image printed for medical record.  Negative aspiration and negative test dose prior to incremental administration of local anesthetic. The patient tolerated the procedure well.

## 2016-11-06 NOTE — Anesthesia Postprocedure Evaluation (Signed)
Anesthesia Post Note  Patient: Lauren Allen  Procedure(s) Performed: Procedure(s) (LRB): IRRIGATION AND DEBRIDEMENT LEFT HAND (Left)     Patient location during evaluation: PACU Anesthesia Type: MAC Level of consciousness: awake and alert Pain management: pain level controlled Vital Signs Assessment: post-procedure vital signs reviewed and stable Respiratory status: spontaneous breathing and respiratory function stable Cardiovascular status: stable Anesthetic complications: no    Last Vitals:  Vitals:   11/06/16 2030 11/06/16 2045  BP: 97/71   Pulse: 93 (!) 116  Resp: 18 (!) 23  Temp:      Last Pain:  Vitals:   11/06/16 2030  TempSrc:   PainSc: 0-No pain                 Leela Vanbrocklin DANIEL

## 2016-11-06 NOTE — Op Note (Signed)
PREOPERATIVE DIAGNOSIS:Left hand dorsal abscess  POSTOPERATIVE DIAGNOSIS:Same  ATTENDING SURGEON:Dr. Bradly Bienenstock who was scrubbed and present for the entire procedure  ASSISTANT SURGEON:none  ANESTHESIA:General via LMA  OPERATIVE PROCEDURE:Left incision and drainage of complicated and wrist abscess-25028 Left wrist extensor tenosynovectomy-25118  IMPLANTS:none  RADIOGRAPHIC INTERPRETATION:none  SURGICAL INDICATIONS: The patient is a right-hand-dominant female who was injecting drugs over the dorsal aspect of her left hand who presented to the emergency department with a worsening infection to the left hand. I was consult to for the management of her dorsal hand abscess. It was recommended that she undergo the above procedure.  SURGICAL TECHNIQUE: The patient was properly identified in the preoperative holding area and a mark with a permanent marker made on the left hand indicate the correct operative site. Patient was then brought back to the operating room and placed supine on anesthesia room table where the block and then performed. A well-padded tourniquet was then placed on the left brachium. The left upper extremity was then prepped and draped in normal sterile fashion. Timeout was called the correct site was identified and the procedure then begun. Attention then turned to the left hand. The dorsal abscess was then opened over the webspace. Large amounts of purulence was expressed from this area. Intraoperative wound cultures were then taken. Debridement of the skin and subcutaneous tissue was then done dorsally. A counter incision was made directly over the fourth dorsal compartment over the wrist region. The patient had the extensive extensor tenosynovitis and fourth dorsal compartment extensor tenosynovectomy was then carried out removing the necrotic and devitalized tissue. The patient had a lot of debris within the subcutaneous tissue where it appeared the substance of the been  injected into  and missed the vein. The wound was then thoroughly irrigated. After extensor tenosynovectomy and debridement the wounds were loosely reapproximated with 3-0 Prolene. Adaptic dressing was then applied. A sterile compressive bandage then applied. The patient was then placed in well-padded volar splint taken recovery room in good condition  POSTOPERATIVE PLAN: The patient is to be admitted to the [facility] for fetal monitoring. Care was coordinating with obstetrician on-call. A planned repeat the I&D on Wednesday. We will have the patient transferred back to [facility] for repeat incision and drainage. IV antibiotics, recommend vancomycin with likely strong likelihood of MRSA.

## 2016-11-06 NOTE — Progress Notes (Signed)
Report called to Marquis Buggy, RN on 3rd floor; [facility] also given report at this time

## 2016-11-07 ENCOUNTER — Encounter (HOSPITAL_COMMUNITY): Payer: Self-pay | Admitting: Orthopedic Surgery

## 2016-11-07 LAB — CBC
HEMATOCRIT: 34.2 % — AB (ref 36.0–46.0)
Hemoglobin: 11.4 g/dL — ABNORMAL LOW (ref 12.0–15.0)
MCH: 27.4 pg (ref 26.0–34.0)
MCHC: 33.3 g/dL (ref 30.0–36.0)
MCV: 82.2 fL (ref 78.0–100.0)
Platelets: 241 10*3/uL (ref 150–400)
RBC: 4.16 MIL/uL (ref 3.87–5.11)
RDW: 13.9 % (ref 11.5–15.5)
WBC: 15 10*3/uL — AB (ref 4.0–10.5)

## 2016-11-07 LAB — ABO/RH: ABO/RH(D): A POS

## 2016-11-07 LAB — TYPE AND SCREEN
ABO/RH(D): A POS
Antibody Screen: NEGATIVE

## 2016-11-07 MED ORDER — LACTATED RINGERS IV SOLN
INTRAVENOUS | Status: DC
  Administered 2016-11-07: 01:00:00 via INTRAVENOUS

## 2016-11-07 NOTE — Progress Notes (Signed)
Patient ID: Lauren Allen, female   DOB: [birth year], 26 y.o.   MRN: [id number] HD #2, [redacted]W[redacted]D, left hand abscess Doing well, no pain, no problems, +FM, not feeling any ctx Afeb, VSS FHT- Cat I, 120-130, had one variable decel, mod variability, + accels, some uterine irritability Continue Methadone and Vancomycin, will monitor FHT q shift, back to [facility] tomorrow for repeat I&D

## 2016-11-07 NOTE — Progress Notes (Signed)
EFM removed; [facility] at bedside for transport

## 2016-11-07 NOTE — Progress Notes (Signed)
The plan will be for the patient to undergo repeat irrigation and debridement tomorrow under a regional block.  Preoperative orders will be placed.  Care will be coordinated over [facility] once again.Following surgery she will return back to [facility] for continued fetal monitoring

## 2016-11-07 NOTE — Progress Notes (Addendum)
ANTIBIOTIC CONSULT NOTE  Pharmacy Consult for vancomycin Indication: cellulitis, hand abscess  Allergies  Allergen Reactions  . Sulfonamide Derivatives Nausea And Vomiting  . Sulfa Antibiotics Palpitations    tachycardia    Patient Measurements: Height: 5' 2" (157.5 cm) Weight: 159 lb (72.1 kg) IBW/kg (Calculated) : 50.1 Adjusted Body Weight:   Vital Signs: Temp: 97.3 F (36.3 C) (07/16 2130) Temp Source: Oral (07/16 1603) BP: 110/68 (07/16 2244) Pulse Rate: 116 (07/16 2244)  Labs:  Recent Labs  11/06/16 0705  WBC 17.3*  HGB 11.8*  PLT 226  CREATININE 0.56    Estimated Creatinine Clearance: 99.1 mL/min (by C-G formula based on SCr of 0.56 mg/dL).  No results for input(s): VANCOTROUGH, VANCOPEAK, VANCORANDOM, GENTTROUGH, GENTPEAK, GENTRANDOM, TOBRATROUGH, TOBRAPEAK, TOBRARND, AMIKACINPEAK, AMIKACINTROU, AMIKACIN in the last 72 hours.   Microbiology: No results found for this or any previous visit (from the past 720 hour(s)).  Medical History: Past Medical History:  Diagnosis Date  . Anxiety   . Crohn's disease (HCC)   . Depression   . Headache(784.0)   . Ileitis   . Opiate addiction (HCC)     Medications:  Prescriptions Prior to Admission  Medication Sig Dispense Refill Last Dose  . cephALEXin (KEFLEX) 500 MG capsule Take 500 mg by mouth 4 (four) times daily.   11/06/2016 at Unknown time  . hydrocortisone cream 1 % Apply 1 application topically 2 (two) times daily as needed for itching (bug bites).    Past Week at Unknown time  . ibuprofen (ADVIL,MOTRIN) 200 MG tablet Take 600 mg by mouth every 6 (six) hours as needed (pain in left hand).   11/06/2016 at Unknown time  . methadone (DOLOPHINE) 10 MG/ML solution Take 95 mg by mouth daily.   11/06/2016 at 5am  . oxymetazoline (AFRIN) 0.05 % nasal spray Place 2 sprays into both nostrils 2 (two) times daily as needed for congestion.   Past Month at Unknown time  . Prenatal Vit-Fe Fumarate-FA (PRENATAL COMPLETE)  14-0.4 MG TABS Take 1 tablet by mouth daily. 60 each 0 Past Week at Unknown time  . RaNITidine HCl (ZANTAC PO) Take 1 tablet by mouth daily.   11/05/2016 at Unknown time  . budesonide (ENTOCORT EC) 3 MG 24 hr capsule Take 3 capsules (9 mg total) by mouth daily. (Patient not taking: Reported on 09/13/2016) 90 capsule 2 Not Taking at Unknown time  . Na Sulfate-K Sulfate-Mg Sulf SOLN Take 1 kit by mouth once. (Patient not taking: Reported on 09/13/2016) 2 Bottle 0 Completed Course at Unknown time  . omeprazole (PRILOSEC) 20 MG capsule Take 1 capsule (20 mg total) by mouth daily. (Patient not taking: Reported on 09/13/2016) 30 capsule 2 Not Taking at Unknown time   Scheduled:  . docusate sodium  100 mg Oral Daily  . methadone  95 mg Oral Daily  . prenatal multivitamin  1 tablet Oral Q1200   Infusions:  . lactated ringers 10 mL/hr at 11/06/16 1701  . lactated ringers    . vancomycin     PRN: acetaminophen, ondansetron **OR** ondansetron (ZOFRAN) IV, oxyCODONE-acetaminophen, zolpidem Anti-infectives    Start     Dose/Rate Route Frequency Ordered Stop   11/07/16 0000  vancomycin (VANCOCIN) IVPB 1000 mg/200 mL premix     1,000 mg 200 mL/hr over 60 Minutes Intravenous Every 12 hours 11/06/16 2358     11/06/16 1030  ceFAZolin (ANCEF) IVPB 2g/100 mL premix     2 g 200 mL/hr over 30 Minutes Intravenous On call   to O.R. 11/06/16 0950 11/06/16 1917      Assessment: 26 yo, [redacted] wk pregnant female s/p I&D (7/16) of left hand abscess originating from IV drug use in hand.  Starting vancomycin for likelihood of MRSA infection in hand.  Pt will likely require repeat I&D within 48 hours per hand surgeon.   Goal of Therapy:  Vancomycin trough level 10-15 mcg/ml  Plan:  Vancomycin 1 gram Q12 hours.  Vancomycin levels at at steady state as indicated.   ,  Scarlett, RPH 11/07/2016,12:02 AM  

## 2016-11-08 ENCOUNTER — Encounter (HOSPITAL_COMMUNITY)
Admission: EM | Disposition: A | Discharge status: Home / Self Care | Payer: Self-pay | Source: Home / Self Care | Attending: Obstetrics and Gynecology

## 2016-11-08 ENCOUNTER — Inpatient Hospital Stay (HOSPITAL_COMMUNITY): Payer: Medicaid Other | Admitting: Certified Registered"

## 2016-11-08 ENCOUNTER — Encounter (HOSPITAL_COMMUNITY): Payer: Self-pay | Admitting: Surgery

## 2016-11-08 HISTORY — PX: I&D EXTREMITY: SHX5045

## 2016-11-08 LAB — SURGICAL PCR SCREEN
MRSA, PCR: NEGATIVE
STAPHYLOCOCCUS AUREUS: POSITIVE — AB

## 2016-11-08 LAB — VANCOMYCIN, TROUGH: Vancomycin Tr: 8 ug/mL — ABNORMAL LOW (ref 15–20)

## 2016-11-08 SURGERY — IRRIGATION AND DEBRIDEMENT EXTREMITY
Anesthesia: Choice | Site: Hand | Laterality: Left

## 2016-11-08 MED ORDER — VANCOMYCIN HCL IN DEXTROSE 1-5 GM/200ML-% IV SOLN
1000.0000 mg | Freq: Three times a day (TID) | INTRAVENOUS | Status: DC
  Administered 2016-11-08 – 2016-11-09 (×3): 1000 mg via INTRAVENOUS
  Filled 2016-11-08 (×5): qty 200

## 2016-11-08 MED ORDER — VANCOMYCIN HCL IN DEXTROSE 1-5 GM/200ML-% IV SOLN
INTRAVENOUS | Status: AC
  Filled 2016-11-08: qty 200

## 2016-11-08 MED ORDER — LACTATED RINGERS IV SOLN
INTRAVENOUS | Status: DC
  Administered 2016-11-08: 15:00:00 via INTRAVENOUS

## 2016-11-08 MED ORDER — FENTANYL CITRATE (PF) 100 MCG/2ML IJ SOLN
INTRAMUSCULAR | Status: AC
  Administered 2016-11-08: 100 ug via INTRAVENOUS
  Filled 2016-11-08: qty 2

## 2016-11-08 MED ORDER — DEXMEDETOMIDINE BOLUS VIA INFUSION
1.0000 ug/kg | Freq: Once | INTRAVENOUS | Status: AC
  Administered 2016-11-08: 60 ug via INTRAVENOUS
  Filled 2016-11-08: qty 60

## 2016-11-08 MED ORDER — DEXMEDETOMIDINE HCL IN NACL 400 MCG/100ML IV SOLN
INTRAVENOUS | Status: DC | PRN
  Administered 2016-11-08: 0.7 ug/kg/h via INTRAVENOUS
  Administered 2016-11-08: 18:00:00 via INTRAVENOUS

## 2016-11-08 MED ORDER — SODIUM CHLORIDE 0.9 % IR SOLN
Status: DC | PRN
  Administered 2016-11-08: 3000 mL

## 2016-11-08 MED ORDER — FENTANYL CITRATE (PF) 100 MCG/2ML IJ SOLN
100.0000 ug | Freq: Once | INTRAMUSCULAR | Status: AC
  Administered 2016-11-08: 100 ug via INTRAVENOUS
  Filled 2016-11-08: qty 2

## 2016-11-08 MED ORDER — MIDAZOLAM HCL 2 MG/2ML IJ SOLN
INTRAMUSCULAR | Status: AC
  Filled 2016-11-08: qty 2

## 2016-11-08 MED ORDER — LACTATED RINGERS IV SOLN
INTRAVENOUS | Status: DC | PRN
  Administered 2016-11-08: 17:00:00 via INTRAVENOUS

## 2016-11-08 SURGICAL SUPPLY — 57 items
BANDAGE ACE 3X5.8 VEL STRL LF (GAUZE/BANDAGES/DRESSINGS) ×3 IMPLANT
BANDAGE ACE 4X5 VEL STRL LF (GAUZE/BANDAGES/DRESSINGS) ×3 IMPLANT
BNDG COHESIVE 1X5 TAN STRL LF (GAUZE/BANDAGES/DRESSINGS) IMPLANT
BNDG CONFORM 2 STRL LF (GAUZE/BANDAGES/DRESSINGS) IMPLANT
BNDG ESMARK 4X9 LF (GAUZE/BANDAGES/DRESSINGS) ×3 IMPLANT
BNDG GAUZE ELAST 4 BULKY (GAUZE/BANDAGES/DRESSINGS) ×3 IMPLANT
CORDS BIPOLAR (ELECTRODE) ×3 IMPLANT
COVER SURGICAL LIGHT HANDLE (MISCELLANEOUS) ×3 IMPLANT
CUFF TOURNIQUET SINGLE 18IN (TOURNIQUET CUFF) ×3 IMPLANT
CUFF TOURNIQUET SINGLE 24IN (TOURNIQUET CUFF) IMPLANT
DRAIN PENROSE 1/4X12 LTX STRL (WOUND CARE) IMPLANT
DRAPE SURG 17X23 STRL (DRAPES) ×3 IMPLANT
DRSG ADAPTIC 3X8 NADH LF (GAUZE/BANDAGES/DRESSINGS) ×3 IMPLANT
ELECT REM PT RETURN 9FT ADLT (ELECTROSURGICAL)
ELECTRODE REM PT RTRN 9FT ADLT (ELECTROSURGICAL) IMPLANT
GAUZE SPONGE 4X4 12PLY STRL (GAUZE/BANDAGES/DRESSINGS) ×3 IMPLANT
GAUZE SPONGE 4X4 16PLY XRAY LF (GAUZE/BANDAGES/DRESSINGS) ×3 IMPLANT
GAUZE XEROFORM 1X8 LF (GAUZE/BANDAGES/DRESSINGS) ×3 IMPLANT
GAUZE XEROFORM 5X9 LF (GAUZE/BANDAGES/DRESSINGS) IMPLANT
GLOVE BIO SURGEON STRL SZ8 (GLOVE) ×3 IMPLANT
GLOVE BIOGEL PI IND STRL 8.5 (GLOVE) ×1 IMPLANT
GLOVE BIOGEL PI INDICATOR 8.5 (GLOVE) ×2
GLOVE SURG ORTHO 8.0 STRL STRW (GLOVE) ×3 IMPLANT
GOWN STRL REUS W/ TWL LRG LVL3 (GOWN DISPOSABLE) ×3 IMPLANT
GOWN STRL REUS W/ TWL XL LVL3 (GOWN DISPOSABLE) ×1 IMPLANT
GOWN STRL REUS W/TWL LRG LVL3 (GOWN DISPOSABLE) ×6
GOWN STRL REUS W/TWL XL LVL3 (GOWN DISPOSABLE) ×2
HANDPIECE INTERPULSE COAX TIP (DISPOSABLE)
KIT BASIN OR (CUSTOM PROCEDURE TRAY) ×3 IMPLANT
KIT ROOM TURNOVER OR (KITS) ×3 IMPLANT
MANIFOLD NEPTUNE II (INSTRUMENTS) ×3 IMPLANT
NEEDLE HYPO 25GX1X1/2 BEV (NEEDLE) IMPLANT
NS IRRIG 1000ML POUR BTL (IV SOLUTION) ×3 IMPLANT
PACK ORTHO EXTREMITY (CUSTOM PROCEDURE TRAY) ×3 IMPLANT
PAD ARMBOARD 7.5X6 YLW CONV (MISCELLANEOUS) ×6 IMPLANT
PAD CAST 4YDX4 CTTN HI CHSV (CAST SUPPLIES) ×1 IMPLANT
PADDING CAST COTTON 4X4 STRL (CAST SUPPLIES) ×2
SET CYSTO W/LG BORE CLAMP LF (SET/KITS/TRAYS/PACK) ×3 IMPLANT
SET HNDPC FAN SPRY TIP SCT (DISPOSABLE) IMPLANT
SOAP 2 % CHG 4 OZ (WOUND CARE) ×3 IMPLANT
SPONGE LAP 18X18 X RAY DECT (DISPOSABLE) ×3 IMPLANT
SPONGE LAP 4X18 X RAY DECT (DISPOSABLE) ×3 IMPLANT
SUCTION FRAZIER HANDLE 10FR (MISCELLANEOUS) ×2
SUCTION TUBE FRAZIER 10FR DISP (MISCELLANEOUS) ×1 IMPLANT
SUT ETHILON 4 0 PS 2 18 (SUTURE) IMPLANT
SUT ETHILON 5 0 P 3 18 (SUTURE)
SUT NYLON ETHILON 5-0 P-3 1X18 (SUTURE) IMPLANT
SUT PROLENE 3 0 PS 2 (SUTURE) ×3 IMPLANT
SWAB CULTURE ESWAB REG 1ML (MISCELLANEOUS) IMPLANT
SYR CONTROL 10ML LL (SYRINGE) IMPLANT
TOWEL OR 17X24 6PK STRL BLUE (TOWEL DISPOSABLE) ×3 IMPLANT
TOWEL OR 17X26 10 PK STRL BLUE (TOWEL DISPOSABLE) ×3 IMPLANT
TUBE CONNECTING 12'X1/4 (SUCTIONS) ×1
TUBE CONNECTING 12X1/4 (SUCTIONS) ×2 IMPLANT
UNDERPAD 30X30 (UNDERPADS AND DIAPERS) ×3 IMPLANT
WATER STERILE IRR 1000ML POUR (IV SOLUTION) ×3 IMPLANT
YANKAUER SUCT BULB TIP NO VENT (SUCTIONS) ×3 IMPLANT

## 2016-11-08 NOTE — Progress Notes (Signed)
Report called to [facility] short stay RN. Carmelina Dane, RN

## 2016-11-08 NOTE — Anesthesia Postprocedure Evaluation (Signed)
Anesthesia Post Note  Patient: Lauren Allen  Procedure(s) Performed: Procedure(s) (LRB): IRRIGATION AND DEBRIDEMENT LEFT HAND (Left)     Patient location during evaluation: PACU Anesthesia Type: MAC Level of consciousness: awake, awake and alert and oriented Pain management: pain level controlled Vital Signs Assessment: post-procedure vital signs reviewed and stable Respiratory status: spontaneous breathing, nonlabored ventilation and respiratory function stable Cardiovascular status: blood pressure returned to baseline Anesthetic complications: no    Last Vitals:  Vitals:   11/08/16 1830 11/08/16 1845  BP: (!) 82/56 91/65  Pulse: 71 74  Resp: 14 (!) 21  Temp: 36.7 C 36.7 C    Last Pain:  Vitals:   11/08/16 1845  TempSrc:   PainSc: 0-No pain                 Sharae Zappulla COKER

## 2016-11-08 NOTE — Transfer of Care (Signed)
Immediate Anesthesia Transfer of Care Note  Patient: Lauren Allen  Procedure(s) Performed: Procedure(s): IRRIGATION AND DEBRIDEMENT LEFT HAND (Left)  Patient Location: PACU  Anesthesia Type:MAC  Level of Consciousness: awake, alert  and oriented  Airway & Oxygen Therapy: Patient Spontanous Breathing  Post-op Assessment: Report given to RN and Post -op Vital signs reviewed and stable  Post vital signs: Reviewed and stable  Last Vitals:  Vitals:   11/08/16 1347 11/08/16 1800  BP: (!) 97/59 (!) 81/60  Pulse: 61 79  Resp: 16 18  Temp: 36.7 C (P) 36.8 C    Last Pain:  Vitals:   11/08/16 1347  TempSrc: Oral  PainSc:       Patients Stated Pain Goal: 3 (82/70/78 6754)  Complications: No apparent anesthesia complications

## 2016-11-08 NOTE — Progress Notes (Signed)
Called to come to [facility] OR  per Anesthesia MD request to monitor fetal heart rate during procedure. Dr Noreene Larsson spoke with Dr Senaida Ores regarding request, orders received from Dr Senaida Ores to monitor fetal heart during procedure and then a NST in PACU.

## 2016-11-08 NOTE — Progress Notes (Signed)
Pharmacy Antibiotic Note  Lauren Allen is a 26 y.o. female admitted on 11/06/2016 with cellulitis/abscess.  Pharmacy has been consulted for vancomycin dosing. Patient with I&D 7/16; going for repeat I&D today. Vancomycin level prior to 4th dose is subtherapeutic at 8 mcg/ml. Patient likely at steady-state and clearing well.  Plan: Change to Vancomycin 1000 mg IV every 8 hours.  Goal trough 10-15 mcg/mL.  Height: 5\' 2"  (157.5 cm) Weight: 159 lb (72.1 kg) IBW/kg (Calculated) : 50.1  Temp (24hrs), Avg:98.3 F (36.8 C), Min:98.1 F (36.7 C), Max:98.6 F (37 C)   Recent Labs Lab 11/06/16 0702 11/06/16 0705 11/07/16 0534 11/08/16 1134  WBC  --  17.3* 15.0*  --   CREATININE  --  0.56  --   --   LATICACIDVEN 0.99  --   --   --   VANCOTROUGH  --   --   --  8*    Estimated Creatinine Clearance: 99.1 mL/min (by C-G formula based on SCr of 0.56 mg/dL).    Allergies  Allergen Reactions  . Sulfonamide Derivatives Nausea And Vomiting  . Sulfa Antibiotics Palpitations    tachycardia    Antimicrobials this admission: Vancomycin 1000mg  IV q12h: started 11/07/16 x4 doses Cefazolin 2gm IV x1 OR on 11/06/16  Dose adjustments this admission: Adjusting current vancomycin dose per level today  Microbiology results: MRSA PCR: Negative (7/18)  Thank you for allowing pharmacy to be a part of this patient's care.  Benetta Spar Marlee Armenteros 11/08/2016 2:09 PM

## 2016-11-08 NOTE — Progress Notes (Signed)
Results for TRUDIE, TIMM (MRN [id number]) as of 11/08/2016 10:35  Ref. Range 11/08/2016 08:00  Staphylococcus aureus Latest Ref Range: NEGATIVE  POSITIVE (A)   Result called to Dr. Glenna Durand representative. Carmelina Dane, RN

## 2016-11-08 NOTE — Anesthesia Preprocedure Evaluation (Addendum)
Anesthesia Evaluation  Patient identified by MRN, date of birth, ID band Patient awake    Reviewed: Allergy & Precautions, NPO status , Patient's Chart, lab work & pertinent test results  Airway Mallampati: II  TM Distance: >3 FB Neck ROM: Full    Dental  (+) Teeth Intact, Dental Advisory Given   Pulmonary former smoker,    breath sounds clear to auscultation  (-) stridor     Cardiovascular  Rhythm:Regular Rate:Normal     Neuro/Psych    GI/Hepatic   Endo/Other    Renal/GU      Musculoskeletal   Abdominal   Peds  Hematology   Anesthesia Other Findings Obvious pregnancy  Reproductive/Obstetrics                             Anesthesia Physical Anesthesia Plan  ASA: III  Anesthesia Plan: MAC and Regional   Post-op Pain Management:    Induction: Intravenous  PONV Risk Score and Plan: Ondansetron  Airway Management Planned: Natural Airway and Simple Face Mask  Additional Equipment:   Intra-op Plan:   Post-operative Plan:   Informed Consent: I have reviewed the patients History and Physical, chart, labs and discussed the procedure including the risks, benefits and alternatives for the proposed anesthesia with the patient or authorized representative who has indicated his/her understanding and acceptance.   Dental advisory given  Plan Discussed with: CRNA and Anesthesiologist  Anesthesia Plan Comments:         Anesthesia Quick Evaluation

## 2016-11-08 NOTE — Progress Notes (Signed)
Patient picked up by [facility] to be transported to [facility] for surgery. Carmelina Dane, RN

## 2016-11-08 NOTE — Progress Notes (Signed)
Patient ID: Lauren Allen, female   DOB: 1990/10/20, 26 y.o.   MRN: [id number] HD #3 33 4/7 weeks hand abscess, relapsed substance abuse Pt states was able to sleep.  Some pain in hand, but ok with meds (percocet) She is on her maintenance methadone, NPO for debridement   afeb vss FHR Category 1 on intermittent monitoring  Gravid NT Left hand--dressing intact, some serosanguinous drainage   Vancomycin for abscess Scheduled debridement at 430pm today at [facility], will return after for fetal monitoring Long d/w pt about substance abuse and recommend we look into residential treatment for her at [facility] She is going to d/w her methadone clinic counselor but seems open to this.

## 2016-11-08 NOTE — Anesthesia Procedure Notes (Signed)
Anesthesia Regional Block: Supraclavicular block   Pre-Anesthetic Checklist: ,, timeout performed, Correct Patient, Correct Site, Correct Laterality, Correct Procedure, Correct Position, site marked, Risks and benefits discussed,  Surgical consent,  Pre-op evaluation,  At surgeon's request and post-op pain management  Laterality: Right  Prep: chloraprep       Needles:  Injection technique: Single-shot  Needle Type: Echogenic Stimulator Needle          Additional Needles:   Procedures: ultrasound guided,,,,,,,,  Narrative:  Start time: 11/08/2016 4:50 PM End time: 11/08/2016 5:00 PM Injection made incrementally with aspirations every 5 mL.  Performed by: Personally   Additional Notes: 30 cc 2% lidocaine with 1:200 epi injected easily

## 2016-11-08 NOTE — Op Note (Signed)
PREOPERATIVE DIAGNOSIS: Left hand abscess History of intravenous drug use  POSTOPERATIVE DIAGNOSIS: Left hand abscess History of intravenous drug use  ATTENDING SURGEON: Dr. Gilman Schmidt who was scrubbed and present for the entire procedure  ASSISTANT SURGEON: None  ANESTHESIA: Supraclavicular block  OPERATIVE PROCEDURE: Drainage of left hand abscess dorsal hand and wrist Debridement of skin subcutaneous tissue dorsum left wrist  IMPLANTS: None  RADIOGRAPHIC INTERPRETATION: None  SURGICAL INDICATIONS: Patient is a right-hand dominant female who is scheduled undergo repeat surgery on her left hand infection. Patient was seen and evaluated. A signed informed consent was obtained.  SURGICAL TECHNIQUE: Patient is probably identified in the preoperative holding area and a mark with a permanent marker made on the left hand indicate the correct operative site. Patient then brought back to operating room placed supine on anesthesia and table where the block had been performed. A well-padded tourniquet was then placed on the left brachium and sealed with the appropriate drape. Left upper extremities and prepped and draped in normal sterile fashion. A timeout was called cracks I was identified and the procedure then begun. Attention was then turned to the left wrist the previous sutures were removed. Drainage of the abscess area both on the ulnar and radial side was then carried out. The wound was thoroughly irrigated. Excisional debridement was then carried out of the 4 x 4 cm area radially as well as the 5 x 2 cm ulnarly. This was done sharply with knife scissors removing the devitalized tissue. The wound after thorough wound irrigation and debridement the tissue looked so much better today. Skin was then reapproximated loosely with 3-0 Prolene suture. Adaptic dressing a sterile compressive bandage then applied. The patient is then placed in well-padded volar splint taken recovery room in good  condition  POSTOPERATIVE PLAN: Patient return back to [facility] for IV antibiotics and pain control. It's okay for her to go home from my standpoint tomorrow need close follow-up in the office next week and be happy to see her in the office next Monday or Tuesday for repeat wound check she needs to keep the splint on at all times. Oral antibiotics as per the obstetric physician. Cover for the culture results.

## 2016-11-08 NOTE — Progress Notes (Signed)
Contact with [facility] in regards to transferring patient to [facility] for surgery. Carmelina Dane, RN

## 2016-11-08 NOTE — Progress Notes (Signed)
Spoke with Dr. Senaida Ores in regards to [facility] OR's concern over fetal monitoring for the patient's upcoming surgery this afternoon. Dr. Peggye Pitt said to get a strip upon [facility] and the [facility] RN will go to [facility] to do fetal monitoring once the patient is in the PACU. Carmelina Dane, RN

## 2016-11-09 ENCOUNTER — Encounter (HOSPITAL_COMMUNITY): Payer: Self-pay | Admitting: Orthopedic Surgery

## 2016-11-09 MED ORDER — OXYCODONE-ACETAMINOPHEN 5-325 MG PO TABS: 1.0000 | ORAL_TABLET | ORAL | 0 refills | Status: AC | PRN

## 2016-11-09 MED ORDER — AMOXICILLIN-POT CLAVULANATE 875-125 MG PO TABS: 1.0000 | ORAL_TABLET | Freq: Two times a day (BID) | ORAL | 0 refills | Status: AC

## 2016-11-09 NOTE — Progress Notes (Signed)
CSW met with patient in room 319.  When CSW arrived, patient was resting in bed. CSW explained CSW's role and inquired about patient's SA hx.  Patient was vague explaining hx of SA however, acknowledged Methadone treatment. CSW offered [facility] inpatient resources for SA treatment and patient appeared hesitant.  Patient communicated that patient wanted to speak with patient's husband prior to making any decision regarding treatment.  CSW agreed with patient and normalized patient's thoughts and feelings.  CSW offered patient a list of SA inpatient resources and patient was receptive. Patient reported that patient will review resources with patient's husband and make a decision for SA treatment.  CSW thanked patient for meeting with CSW and provided patient with CSW's contact information.    CSW updated bedside nurse and provided patient with a list of [place] inpatient treatment programs.  Laurey Arrow, MSW, LCSW Clinical Social Work [phone]

## 2016-11-09 NOTE — Discharge Instructions (Signed)
Monitor fetal movements Call if develop cramping or contractions, vaginal bleeding or abnormal vaginal discharge or if note change in fetal activity

## 2016-11-09 NOTE — Progress Notes (Signed)
Patient ID: Lauren Allen, female   DOB: [birth year], 26 y.o.   MRN: [id number] HD#4 Pt doing well s/p re-operation as planned for I&D of left hand cellulitis. She is fatigued from lack of sleep overnight but otherwise feels well. She denies fever, chills or contractions. She is appreciating FMs and pain well controlled with meds Pt feels ready for discharge to home today. She will be going to methadone clinic in am for dose and plans to see her counselor there Pt is contemplating [facility] but would like to discuss with her partner and her counselor as well. She is open to getting info from SW to share with them  VSS EFM - cat 1, 120s TOCO - no contractions SVE - declined ( offered)  Vanc trough 8 Staph positive MRSA neg CBC - pending   A/P: G1P0 at 33 5/[redacted]wks ga on HD#4 s/p I&D x 2 left                  Hand; s/p drug relapse - currently stable          Reviewed discharge instructions with pt:              F/U with Dr Janett Billow (Ortho-hand) on mon/tues - appt to be scheduled by nursing prior to pt discharge             Keep scheduled appt with Dr Ellyn Hack [facility]) on 7/26             Consult with SW regarding info for [facility] - if desires to pursue then assistance with process              D/C home on percocet 5/325mg  for pain control and augmentin 875mg  BID x 7 week

## 2016-11-09 NOTE — Progress Notes (Signed)
Follow-up appointment at [facility] [address] [place], [place] [address] [phone] made for Monday July 23 at 1:00 pm

## 2016-11-09 NOTE — Discharge Summary (Signed)
OB Discharge Summary     Patient Name: Lauren Allen DOB: 26-Dec-1990 MRN: [id number]  Date of admission: 11/06/2016  Date of discharge: 11/09/2016  Admitting diagnosis: Abscess [L02.91] Intrauterine pregnancy: [redacted]w[redacted]d     Secondary diagnosis:  Principal Problem:   Abscess of left hand Active Problems:   Opiate addiction (HCC)   Pregnancy and infectious disease, third trimester   Sepsis (HCC)   Post-operative state  Additional problems: none     Discharge diagnosis: s/p I&D left hand; iup 33 5/7wks                                                                                                Hospital course:  Pt admitted after I&D of left hand, abcess following heroin relapse, for monitoring. Cat 1 strip during this hospitalization and no pregnancy complications noted. Pt had second surgery for I&D of abcess as planned yesterday. She has done well post op and is stable for discharge to home today   Physical exam  Vitals:   11/08/16 2025 11/09/16 0010 11/09/16 0420 11/09/16 0746  BP: 119/69 (!) 94/51 111/68 111/68  Pulse: 68 (!) 59 76 65  Resp: 18 18 16 15   Temp: 97.8 F (36.6 C) 98.3 F (36.8 C) 98.7 F (37.1 C) 98.2 F (36.8 C)  TempSrc: Oral Oral Oral Oral  SpO2: 99% 99% 99% 100%  Weight:      Height:       General: alert, cooperative and no distress Uterine Fundus: c/w ga  DVT Evaluation: No evidence of DVT seen on physical exam. Labs: Lab Results  Component Value Date   WBC 15.0 (H) 11/07/2016   HGB 11.4 (L) 11/07/2016   HCT 34.2 (L) 11/07/2016   MCV 82.2 11/07/2016   PLT 241 11/07/2016   CMP Latest Ref Rng & Units 11/06/2016  Glucose 65 - 99 mg/dL 85  BUN 6 - 20 mg/dL 13  Creatinine 9.56 - 3.87 mg/dL 5.64  Sodium 332 - 951 mmol/L 136  Potassium 3.5 - 5.1 mmol/L 3.7  Chloride 101 - 111 mmol/L 105  CO2 22 - 32 mmol/L 23  Calcium 8.9 - 10.3 mg/dL 8.8(C)  Total Protein 6.5 - 8.1 g/dL -  Total Bilirubin 0.3 - 1.2 mg/dL -  Alkaline Phos 38 - 166 U/L -   AST 15 - 41 U/L -  ALT 14 - 54 U/L -    Discharge instruction: per After Visit Summary and "Baby and Me Booklet".  After visit meds:  Allergies as of 11/09/2016      Reactions   Sulfonamide Derivatives Nausea And Vomiting   Sulfa Antibiotics Palpitations   tachycardia      Medication List    STOP taking these medications   budesonide 3 MG 24 hr capsule Commonly known as:  ENTOCORT EC   ibuprofen 200 MG tablet Commonly known as:  ADVIL,MOTRIN   KEFLEX 500 MG capsule Generic drug:  cephALEXin   Na Sulfate-K Sulfate-Mg Sulf 17.5-3.13-1.6 GM/180ML Soln   omeprazole 20 MG capsule Commonly known as:  PRILOSEC     TAKE these medications   amoxicillin-clavulanate  875-125 MG tablet Commonly known as:  AUGMENTIN Take 1 tablet by mouth 2 (two) times daily.   hydrocortisone cream 1 % Apply 1 application topically 2 (two) times daily as needed for itching (bug bites).   methadone 10 MG/ML solution Commonly known as:  DOLOPHINE Take 95 mg by mouth daily.   oxyCODONE-acetaminophen 5-325 MG tablet Commonly known as:  PERCOCET Take 1 tablet by mouth every 4 (four) hours as needed for severe pain.   oxymetazoline 0.05 % nasal spray Commonly known as:  AFRIN Place 2 sprays into both nostrils 2 (two) times daily as needed for congestion.   PRENATAL COMPLETE 14-0.4 MG Tabs Take 1 tablet by mouth daily.   ZANTAC PO Take 1 tablet by mouth daily.       Diet: routine diet  Activity: Advance as tolerated. Pelvic rest for 6 weeks.   Outpatient follow up:7/26 with Dr Ellyn Hack 7/23 or 7/24 Dr Melvyn Novas  Disposition: stable to home   11/09/2016 Cathrine Muster, DO

## 2016-11-09 NOTE — OR Nursing (Signed)
Addendum created to remove the Phase II times from PACU log

## 2016-11-11 LAB — CULTURE, BLOOD (ROUTINE X 2)
Culture: NO GROWTH
Culture: NO GROWTH
Special Requests: ADEQUATE
Special Requests: ADEQUATE

## 2016-11-14 LAB — AEROBIC/ANAEROBIC CULTURE (SURGICAL/DEEP WOUND)

## 2016-11-14 LAB — AEROBIC/ANAEROBIC CULTURE W GRAM STAIN (SURGICAL/DEEP WOUND)

## 2016-12-03 ENCOUNTER — Inpatient Hospital Stay (HOSPITAL_COMMUNITY): Payer: Medicaid Other | Admitting: Anesthesiology

## 2016-12-03 ENCOUNTER — Inpatient Hospital Stay (HOSPITAL_COMMUNITY)
Admission: AD | Admit: 2016-12-03 | Discharge: 2016-12-06 | DRG: 775 | Disposition: A | Discharge status: Home / Self Care | Payer: Medicaid Other | Source: Ambulatory Visit | Attending: Obstetrics and Gynecology | Admitting: Obstetrics and Gynecology

## 2016-12-03 ENCOUNTER — Encounter (HOSPITAL_COMMUNITY): Payer: Self-pay

## 2016-12-03 DIAGNOSIS — O4292 Full-term premature rupture of membranes, unspecified as to length of time between rupture and onset of labor: Secondary | ICD-10-CM | POA: Diagnosis present

## 2016-12-03 DIAGNOSIS — F1120 Opioid dependence, uncomplicated: Secondary | ICD-10-CM | POA: Diagnosis present

## 2016-12-03 DIAGNOSIS — Z87891 Personal history of nicotine dependence: Secondary | ICD-10-CM | POA: Diagnosis not present

## 2016-12-03 DIAGNOSIS — Z3490 Encounter for supervision of normal pregnancy, unspecified, unspecified trimester: Secondary | ICD-10-CM

## 2016-12-03 DIAGNOSIS — O99324 Drug use complicating childbirth: Secondary | ICD-10-CM | POA: Diagnosis present

## 2016-12-03 DIAGNOSIS — Z3A37 37 weeks gestation of pregnancy: Secondary | ICD-10-CM

## 2016-12-03 LAB — POCT FERN TEST: POCT FERN TEST: POSITIVE

## 2016-12-03 LAB — RAPID URINE DRUG SCREEN, HOSP PERFORMED
AMPHETAMINES: NOT DETECTED
BARBITURATES: NOT DETECTED
Benzodiazepines: NOT DETECTED
Cocaine: NOT DETECTED
OPIATES: POSITIVE — AB
TETRAHYDROCANNABINOL: NOT DETECTED

## 2016-12-03 LAB — CBC
HCT: 37.1 % (ref 36.0–46.0)
HEMOGLOBIN: 12.2 g/dL (ref 12.0–15.0)
MCH: 26.8 pg (ref 26.0–34.0)
MCHC: 32.9 g/dL (ref 30.0–36.0)
MCV: 81.5 fL (ref 78.0–100.0)
PLATELETS: 187 10*3/uL (ref 150–400)
RBC: 4.55 MIL/uL (ref 3.87–5.11)
RDW: 14.7 % (ref 11.5–15.5)
WBC: 16.5 10*3/uL — AB (ref 4.0–10.5)

## 2016-12-03 LAB — TYPE AND SCREEN
ABO/RH(D): A POS
Antibody Screen: NEGATIVE

## 2016-12-03 MED ORDER — LIDOCAINE HCL (PF) 1 % IJ SOLN
30.0000 mL | INTRAMUSCULAR | Status: DC | PRN
  Filled 2016-12-03: qty 30

## 2016-12-03 MED ORDER — OXYTOCIN BOLUS FROM INFUSION
500.0000 mL | Freq: Once | INTRAVENOUS | Status: AC
  Administered 2016-12-04: 500 mL via INTRAVENOUS

## 2016-12-03 MED ORDER — FENTANYL 2.5 MCG/ML BUPIVACAINE 1/10 % EPIDURAL INFUSION (WH - ANES)
14.0000 mL/h | INTRAMUSCULAR | Status: DC | PRN
  Administered 2016-12-03: 14 mL/h via EPIDURAL
  Filled 2016-12-03: qty 100

## 2016-12-03 MED ORDER — EPHEDRINE 5 MG/ML INJ
10.0000 mg | INTRAVENOUS | Status: DC | PRN
  Filled 2016-12-03: qty 2

## 2016-12-03 MED ORDER — LACTATED RINGERS IV SOLN
500.0000 mL | Freq: Once | INTRAVENOUS | Status: AC
  Administered 2016-12-03: 500 mL via INTRAVENOUS

## 2016-12-03 MED ORDER — ONDANSETRON HCL 4 MG/2ML IJ SOLN: 4.0000 mg | Freq: Four times a day (QID) | INTRAMUSCULAR | Status: DC | PRN

## 2016-12-03 MED ORDER — SOD CITRATE-CITRIC ACID 500-334 MG/5ML PO SOLN: 30.0000 mL | ORAL | Status: DC | PRN

## 2016-12-03 MED ORDER — OXYTOCIN 40 UNITS IN LACTATED RINGERS INFUSION - SIMPLE MED
1.0000 m[IU]/min | INTRAVENOUS | Status: DC
  Administered 2016-12-03: 18 m[IU]/min via INTRAVENOUS
  Administered 2016-12-03: 4 m[IU]/min via INTRAVENOUS
  Administered 2016-12-03: 2 m[IU]/min via INTRAVENOUS
  Administered 2016-12-03: 6 m[IU]/min via INTRAVENOUS
  Filled 2016-12-03: qty 1000

## 2016-12-03 MED ORDER — METHADONE HCL 10 MG/ML PO CONC
95.0000 mg | Freq: Every day | ORAL | Status: DC
  Filled 2016-12-03: qty 9.5

## 2016-12-03 MED ORDER — LACTATED RINGERS IV SOLN: 500.0000 mL | INTRAVENOUS | Status: DC | PRN

## 2016-12-03 MED ORDER — METHADONE HCL 10 MG PO TABS: 95.0000 mg | ORAL_TABLET | Freq: Every day | ORAL | Status: DC

## 2016-12-03 MED ORDER — LIDOCAINE HCL (PF) 1 % IJ SOLN
INTRAMUSCULAR | Status: DC | PRN
  Administered 2016-12-03 (×2): 5 mL

## 2016-12-03 MED ORDER — DIPHENHYDRAMINE HCL 50 MG/ML IJ SOLN: 12.5000 mg | INTRAMUSCULAR | Status: DC | PRN

## 2016-12-03 MED ORDER — LACTATED RINGERS IV SOLN
INTRAVENOUS | Status: DC
  Administered 2016-12-03 (×2): via INTRAVENOUS

## 2016-12-03 MED ORDER — ACETAMINOPHEN 325 MG PO TABS: 650.0000 mg | ORAL_TABLET | ORAL | Status: DC | PRN

## 2016-12-03 MED ORDER — OXYCODONE-ACETAMINOPHEN 5-325 MG PO TABS: 2.0000 | ORAL_TABLET | ORAL | Status: DC | PRN

## 2016-12-03 MED ORDER — LACTATED RINGERS IV SOLN
INTRAVENOUS | Status: DC
Start: 2016-12-03 — End: 2016-12-03
  Administered 2016-12-03: 11:00:00 via INTRAVENOUS

## 2016-12-03 MED ORDER — OXYCODONE-ACETAMINOPHEN 5-325 MG PO TABS: 1.0000 | ORAL_TABLET | ORAL | Status: DC | PRN

## 2016-12-03 MED ORDER — PHENYLEPHRINE 40 MCG/ML (10ML) SYRINGE FOR IV PUSH (FOR BLOOD PRESSURE SUPPORT)
80.0000 ug | PREFILLED_SYRINGE | INTRAVENOUS | Status: DC | PRN
  Filled 2016-12-03: qty 5
  Filled 2016-12-03: qty 10

## 2016-12-03 MED ORDER — OXYTOCIN 40 UNITS IN LACTATED RINGERS INFUSION - SIMPLE MED
2.5000 [IU]/h | INTRAVENOUS | Status: DC
  Administered 2016-12-04: 2.5 [IU]/h via INTRAVENOUS

## 2016-12-03 MED ORDER — TERBUTALINE SULFATE 1 MG/ML IJ SOLN
0.2500 mg | Freq: Once | INTRAMUSCULAR | Status: DC | PRN
  Filled 2016-12-03: qty 1

## 2016-12-03 MED ORDER — SODIUM CHLORIDE 0.9 % IV SOLN
2.0000 g | Freq: Four times a day (QID) | INTRAVENOUS | Status: DC
  Administered 2016-12-03: 2 g via INTRAVENOUS
  Filled 2016-12-03 (×3): qty 2000

## 2016-12-03 MED ORDER — PHENYLEPHRINE 40 MCG/ML (10ML) SYRINGE FOR IV PUSH (FOR BLOOD PRESSURE SUPPORT)
80.0000 ug | PREFILLED_SYRINGE | INTRAVENOUS | Status: DC | PRN
  Filled 2016-12-03: qty 5

## 2016-12-03 NOTE — Anesthesia Preprocedure Evaluation (Signed)
Anesthesia Evaluation  Patient identified by MRN, date of birth, ID band Patient awake    Reviewed: Allergy & Precautions, H&P , NPO status , Patient's Chart, lab work & pertinent test results  History of Anesthesia Complications Negative for: history of anesthetic complications  Airway Mallampati: II  TM Distance: >3 FB Neck ROM: full    Dental no notable dental hx. (+) Teeth Intact   Pulmonary neg pulmonary ROS, former smoker,    Pulmonary exam normal breath sounds clear to auscultation       Cardiovascular negative cardio ROS Normal cardiovascular exam Rhythm:regular Rate:Normal     Neuro/Psych negative neurological ROS  negative psych ROS   GI/Hepatic negative GI ROS, (+)     substance abuse (opiates)  ,   Endo/Other  negative endocrine ROS  Renal/GU negative Renal ROS  negative genitourinary   Musculoskeletal   Abdominal   Peds  Hematology negative hematology ROS (+)   Anesthesia Other Findings   Reproductive/Obstetrics (+) Pregnancy                             Anesthesia Physical Anesthesia Plan  ASA: II  Anesthesia Plan: Epidural   Post-op Pain Management:    Induction:   PONV Risk Score and Plan:   Airway Management Planned:   Additional Equipment:   Intra-op Plan:   Post-operative Plan:   Informed Consent: I have reviewed the patients History and Physical, chart, labs and discussed the procedure including the risks, benefits and alternatives for the proposed anesthesia with the patient or authorized representative who has indicated his/her understanding and acceptance.     Plan Discussed with:   Anesthesia Plan Comments:         Anesthesia Quick Evaluation

## 2016-12-03 NOTE — Progress Notes (Signed)
Patient ID: Lauren Allen, female   DOB: 05-13-1990, 26 y.o.   MRN: [id number] Pt doing well. Tolerating contractions. +Fms Pt confirms did use heroin two days ago - inhaled. She is aware nursery/NICU staff will be notified.  Discussed which family members she is comfortable being in room during delivery as info may be shared in course of care despite measures to preserve privacy depending on baby condition at delivery She has no complaints VSS EFM - cat 1 130 TOCO - ctxs q SVE 4/70/-2; pit at  A/P: prime at term in latent labor, SROM          Heroin use recently - neonatologist informed         Pitocin per protocol         Epidural for pain relief         Anticipate svd

## 2016-12-03 NOTE — Progress Notes (Signed)
Patient ID: Lauren Allen, female   DOB: 03-05-1991, 26 y.o.   MRN: [id number] Pt now comfortable with epidural. +Fms VSS EFM - cat 1 120 TOCO - ctxs q 2-34mins SVE - 5/100/-1 Pit at  A/P: Prime at term with SROM x 17hrs          Amp for infection prevention         Keep pitocin at         Anticipate svd

## 2016-12-03 NOTE — Anesthesia Pain Management Evaluation Note (Signed)
  CRNA Pain Management Visit Note  Patient: Lauren Allen, 27 y.o., female  "Hello I am a member of the anesthesia team at [facility]. We have an anesthesia team available at all times to provide care throughout the hospital, including epidural management and anesthesia for C-section. I don't know your plan for the delivery whether it a natural birth, water birth, IV sedation, nitrous supplementation, doula or epidural, but we want to meet your pain goals."   1.Was your pain managed to your expectations on prior hospitalizations?   No prior hospitalizations  2.What is your expectation for pain management during this hospitalization?     Epidural  3.How can we help you reach that goal? Unsure.  Would like to avoid IV narcotics  Record the patient's initial score and the patient's pain goal.   Pain: 5  Pain Goal: 8 [facility] wants you to be able to say your pain was always managed very well.  Cephus Shelling 12/03/2016

## 2016-12-03 NOTE — H&P (Signed)
Kayliah Tuffy is a 26 y.o. female presenting for leakage of fluid since 6am today. Pt confirmed SROM in MAU. She denies any painful regular contractions. Prenatal care dated by 17week [place]. Pt with hx substance abuse - on methadone 95mg  daily ; multiple relapses in pregnancy. Recently had had surgery for abcess after injection of heroin. She is a tobacco user. GBS neg, essential panel neg, afp neg   OB History    Gravida Para Term Preterm AB Living   1 0 0 0 0 0   SAB TAB Ectopic Multiple Live Births                 Past Medical History:  Diagnosis Date  . Anxiety   . Crohn's disease (HCC)   . Depression   . Headache(784.0)   . Ileitis   . Opiate addiction [facility])    Past Surgical History:  Procedure Laterality Date  . COLONOSCOPY    . I&D EXTREMITY Left 11/06/2016   Procedure: IRRIGATION AND DEBRIDEMENT LEFT HAND;  Surgeon: Bradly Bienenstock, MD;  Location: [facility] OR;  Service: Orthopedics;  Laterality: Left;  . I&D EXTREMITY Left 11/08/2016   Procedure: IRRIGATION AND DEBRIDEMENT LEFT HAND;  Surgeon: Bradly Bienenstock, MD;  Location: [facility] OR;  Service: Orthopedics;  Laterality: Left;  . TONSILLECTOMY     Family History: family history includes Celiac disease in her maternal aunt; Colon cancer in her maternal grandfather; Diabetes in her maternal grandmother; Heart disease in her unknown relative. Social History:  reports that she quit smoking about 2 months ago. She smoked 0.50 packs per day. She has never used smokeless tobacco. She reports that she drinks alcohol. She reports that she uses drugs.     Maternal Diabetes: No Genetic Screening: Normal Maternal Ultrasounds/Referrals: Normal Fetal Ultrasounds or other Referrals:  None Maternal Substance Abuse:  Yes:  Type: Methadone, Other: heroin Significant Maternal Medications:  Meds include: Other: methadone 95mg  Significant Maternal Lab Results:  None Other Comments:  None  Review of Systems  Constitutional: Negative for chills, fever,  malaise/fatigue and weight loss.  Eyes: Negative for blurred vision.  Respiratory: Negative for shortness of breath.   Cardiovascular: Negative for chest pain.  Gastrointestinal: Negative for abdominal pain, heartburn, nausea and vomiting.  Genitourinary: Negative for dysuria.  Musculoskeletal: Negative for myalgias.  Skin: Negative for itching and rash.  Neurological: Negative for dizziness and headaches.  Endo/Heme/Allergies: Does not bruise/bleed easily.  Psychiatric/Behavioral: Positive for substance abuse. Negative for depression, hallucinations and suicidal ideas. The patient is nervous/anxious.    Maternal Medical History:  Reason for admission: Rupture of membranes.  Nausea.  Contractions: Frequency: rare.   Perceived severity is mild.    Fetal activity: Perceived fetal activity is normal.   Last perceived fetal movement was within the past hour.    Prenatal complications: Infection and substance abuse.   Prenatal Complications - Diabetes: none.      Blood pressure 110/74, pulse 89, temperature 98.1 F (36.7 C), temperature source Axillary, resp. rate 20, height 5\' 2"  (1.575 m), weight 166 lb (75.3 kg), last menstrual period 01/24/2016. Maternal Exam:  Uterine Assessment: Contraction strength is mild.  Contraction frequency is rare.   Abdomen: Patient reports generalized tenderness.  Estimated fetal weight is AGA.    Introitus: Normal vulva. Vulva is negative for condylomata and lesion.  Normal vagina.  Vagina is negative for condylomata and discharge.  Ferning test: positive.  Nitrazine test: positive. Amniotic fluid character: clear.  Pelvis: adequate for delivery.   Cervix:  Cervix evaluated by digital exam.     Physical Exam  Constitutional: She is oriented to person, place, and time. She appears well-developed and well-nourished.  Neck: Normal range of motion.  Cardiovascular: Normal rate.   Respiratory: Effort normal.  GI: Soft. There is generalized  tenderness.  Genitourinary: Vagina normal and uterus normal. Vulva exhibits no lesion. No vaginal discharge found.  Musculoskeletal: Normal range of motion.  Neurological: She is alert and oriented to person, place, and time.  Skin: Skin is warm.  Psychiatric: She has a normal mood and affect. Her behavior is normal. Judgment and thought content normal.    Prenatal labs: ABO, Rh: --/--/A POS, A POS (07/16 2335) Antibody: NEG (07/16 2335) Rubella:   RPR:    HBsAg:    HIV:    GBS:     Assessment/Plan: G1P0 at 56 1/7wks with SROM  CAT 1 strip 2-3cm at last check Pain control prn Augment with pitocin  UDS Anticipate svd   Janean Sark Banga 12/03/2016, 11:46 AM

## 2016-12-03 NOTE — Anesthesia Procedure Notes (Signed)
Epidural Patient location during procedure: OB  Staffing Anesthesiologist: Phillips Grout Performed: anesthesiologist   Preanesthetic Checklist Completed: patient identified, site marked, surgical consent, pre-op evaluation, timeout performed, IV checked, risks and benefits discussed and monitors and equipment checked  Epidural Patient position: sitting Prep: DuraPrep Patient monitoring: heart rate, continuous pulse ox and blood pressure Approach: right paramedian Location: L4-L5 Injection technique: LOR saline  Needle:  Needle type: Tuohy  Needle gauge: 17 G Needle length: 9 cm and 9 Needle insertion depth: 5 cm Catheter type: closed end flexible Catheter size: 20 Guage Catheter at skin depth: 10 cm Test dose: negative  Assessment Events: blood not aspirated, injection not painful, no injection resistance, negative IV test and no paresthesia  Additional Notes Patient identified. Risks/Benefits/Options discussed with patient including but not limited to bleeding, infection, nerve damage, paralysis, failed block, incomplete pain control, headache, blood pressure changes, nausea, vomiting, reactions to medication both or allergic, itching and postpartum back pain. Confirmed with bedside nurse the patient's most recent platelet count. Confirmed with patient that they are not currently taking any anticoagulation, have any bleeding history or any family history of bleeding disorders. Patient expressed understanding and wished to proceed. All questions were answered. Sterile technique was used throughout the entire procedure. Please see nursing notes for vital signs. Test dose was given through epidural needle and negative prior to continuing to dose epidural or start infusion. Warning signs of high block given to the patient including shortness of breath, tingling/numbness in hands, complete motor block, or any concerning symptoms with instructions to call for help. Patient was given  instructions on fall risk and not to get out of bed. All questions and concerns addressed with instructions to call with any issues.

## 2016-12-03 NOTE — Progress Notes (Signed)
Patient ID: Lauren Allen, female   DOB: 11-04-1990, 26 y.o.   MRN: [id number] Pt admitted to nurse may have used heroin three days ago. Parents not aware of her drug use. Will confirm with pt once family leaves room.  Pending UDS Will notify nursery/NICU of results

## 2016-12-03 NOTE — MAU Note (Signed)
Started leaking about 0900, clear fluid, still coming. No bleeding, no pain.  has been head down, on Thurs was 2-3cm

## 2016-12-04 ENCOUNTER — Encounter (HOSPITAL_COMMUNITY): Payer: Self-pay | Admitting: *Deleted

## 2016-12-04 LAB — RPR: RPR Ser Ql: NONREACTIVE

## 2016-12-04 MED ORDER — WITCH HAZEL-GLYCERIN EX PADS: 1.0000 "application " | MEDICATED_PAD | CUTANEOUS | Status: DC | PRN

## 2016-12-04 MED ORDER — COCONUT OIL OIL: 1.0000 "application " | TOPICAL_OIL | Status: DC | PRN

## 2016-12-04 MED ORDER — ZOLPIDEM TARTRATE 5 MG PO TABS: 5.0000 mg | ORAL_TABLET | Freq: Every evening | ORAL | Status: DC | PRN

## 2016-12-04 MED ORDER — ACETAMINOPHEN 325 MG PO TABS
650.0000 mg | ORAL_TABLET | ORAL | Status: DC | PRN
  Administered 2016-12-04: 650 mg via ORAL
  Filled 2016-12-04: qty 2

## 2016-12-04 MED ORDER — ONDANSETRON HCL 4 MG/2ML IJ SOLN: 4.0000 mg | INTRAMUSCULAR | Status: DC | PRN

## 2016-12-04 MED ORDER — DIBUCAINE 1 % RE OINT: 1.0000 "application " | TOPICAL_OINTMENT | RECTAL | Status: DC | PRN

## 2016-12-04 MED ORDER — SIMETHICONE 80 MG PO CHEW: 80.0000 mg | CHEWABLE_TABLET | ORAL | Status: DC | PRN

## 2016-12-04 MED ORDER — ONDANSETRON HCL 4 MG PO TABS: 4.0000 mg | ORAL_TABLET | ORAL | Status: DC | PRN

## 2016-12-04 MED ORDER — TETANUS-DIPHTH-ACELL PERTUSSIS 5-2.5-18.5 LF-MCG/0.5 IM SUSP: 0.5000 mL | Freq: Once | INTRAMUSCULAR | Status: DC

## 2016-12-04 MED ORDER — OXYCODONE HCL 5 MG PO TABS: 10.0000 mg | ORAL_TABLET | ORAL | Status: DC | PRN

## 2016-12-04 MED ORDER — SENNOSIDES-DOCUSATE SODIUM 8.6-50 MG PO TABS
2.0000 | ORAL_TABLET | ORAL | Status: DC
  Administered 2016-12-04 – 2016-12-06 (×2): 2 via ORAL
  Filled 2016-12-04 (×2): qty 2

## 2016-12-04 MED ORDER — IBUPROFEN 600 MG PO TABS
600.0000 mg | ORAL_TABLET | Freq: Four times a day (QID) | ORAL | Status: DC
Start: 2016-12-04 — End: 2016-12-06
  Administered 2016-12-04 – 2016-12-06 (×10): 600 mg via ORAL
  Filled 2016-12-04 (×10): qty 1

## 2016-12-04 MED ORDER — METHADONE HCL 10 MG/ML PO CONC
110.0000 mg | Freq: Every day | ORAL | Status: DC
  Administered 2016-12-04 – 2016-12-06 (×3): 110 mg via ORAL
  Filled 2016-12-04 (×4): qty 11

## 2016-12-04 MED ORDER — PRENATAL MULTIVITAMIN CH
1.0000 | ORAL_TABLET | Freq: Every day | ORAL | Status: DC
  Administered 2016-12-04 – 2016-12-06 (×3): 1 via ORAL
  Filled 2016-12-04 (×3): qty 1

## 2016-12-04 MED ORDER — DIPHENHYDRAMINE HCL 25 MG PO CAPS: 25.0000 mg | ORAL_CAPSULE | Freq: Four times a day (QID) | ORAL | Status: DC | PRN

## 2016-12-04 MED ORDER — BENZOCAINE-MENTHOL 20-0.5 % EX AERO
1.0000 "application " | INHALATION_SPRAY | CUTANEOUS | Status: DC | PRN
  Administered 2016-12-04: 1 via TOPICAL
  Filled 2016-12-04: qty 56

## 2016-12-04 MED ORDER — OXYCODONE HCL 5 MG PO TABS: 5.0000 mg | ORAL_TABLET | ORAL | Status: DC | PRN

## 2016-12-04 NOTE — Anesthesia Postprocedure Evaluation (Signed)
Anesthesia Post Note  Patient: Lauren Allen  Procedure(s) Performed: * No procedures listed *     Patient location during evaluation: Mother Baby Anesthesia Type: Epidural Level of consciousness: awake and alert and oriented Pain management: satisfactory to patient Vital Signs Assessment: post-procedure vital signs reviewed and stable Respiratory status: respiratory function stable and spontaneous breathing Cardiovascular status: stable Postop Assessment: no headache, no backache, epidural receding, patient able to bend at knees, no signs of nausea or vomiting and adequate PO intake Anesthetic complications: no    Last Vitals:  Vitals:   12/04/16 0347 12/04/16 0447  BP: 114/71 109/66  Pulse: 73 65  Resp: 18 18  Temp: 37 C 37 C  SpO2:      Last Pain:  Vitals:   12/04/16 0547  TempSrc:   PainSc: 6    Pain Goal: Patients Stated Pain Goal:  (No intervention desired at this time) (12/03/16 2131)               Saraiyah Hemminger

## 2016-12-04 NOTE — Progress Notes (Signed)
UR chart review completed.  

## 2016-12-04 NOTE — Plan of Care (Signed)
Problem: Pain Management: Goal: General experience of comfort will improve and pain level will decrease Outcome: Progressing Patient denied any pain. Will continue to monitor.

## 2016-12-04 NOTE — Progress Notes (Addendum)
Patient ID: Lauren Allen, female   DOB: [birth year], 26 y.o.   MRN: [id number] PPD #0 Doing ok Afeb, VSS Continue routine care, continue Methadone

## 2016-12-05 ENCOUNTER — Encounter (HOSPITAL_COMMUNITY): Payer: Self-pay | Admitting: *Deleted

## 2016-12-05 LAB — CBC
HEMATOCRIT: 32.7 % — AB (ref 36.0–46.0)
Hemoglobin: 10.9 g/dL — ABNORMAL LOW (ref 12.0–15.0)
MCH: 27 pg (ref 26.0–34.0)
MCHC: 33.3 g/dL (ref 30.0–36.0)
MCV: 81.1 fL (ref 78.0–100.0)
Platelets: 156 10*3/uL (ref 150–400)
RBC: 4.03 MIL/uL (ref 3.87–5.11)
RDW: 15 % (ref 11.5–15.5)
WBC: 12.4 10*3/uL — ABNORMAL HIGH (ref 4.0–10.5)

## 2016-12-05 NOTE — Lactation Note (Signed)
This note was copied from a baby's chart. Lactation Consultation Note  Patient Name: Lauren Allen MBTDH'R Date: 12/05/2016   Late entry: 12/05/16 @ 0019. Calling from home, I shared my concern with RN about Mom having been set up with a pump when Mom had had multiple heroin relapses during the pregnancy. (Per Maisie Fus Hale's "Medications & Mother's Milk" 2017, less than 3% of methadone tends to be transferred in breast milk). I encouraged discussion w/pediatrician. 12/05/16 @ 0057. I again called RN and encouraged her to see if Mom had had an additional HIV status drawn since her apparent IV drug use this pregnancy.   Lurline Hare [facility] 12/05/2016, 9:42 AM

## 2016-12-05 NOTE — Progress Notes (Signed)
Post Partum Day 1 Subjective: no complaints, up ad lib, voiding, tolerating PO and nl lochia, pain controlled  Objective: Blood pressure 102/71, pulse 63, temperature 98.5 F (36.9 C), temperature source Oral, resp. rate 18, height 5\' 2"  (1.575 m), weight 75.3 kg (166 lb), last menstrual period 01/24/2016, SpO2 99 %, unknown if currently breastfeeding.  Physical Exam:  General: alert and no distress Lochia: appropriate Uterine Fundus: firm    Recent Labs  12/03/16 1205 12/05/16 0610  HGB 12.2 10.9*  HCT 37.1 32.7*    Assessment/Plan: Plan for discharge tomorrow, Breastfeeding and Lactation consult.  Doing well.  Routine care.  Social Work for opiate use.     LOS: 2 days   Lauren Allen 12/05/2016, 10:00 AM

## 2016-12-05 NOTE — Clinical Social Work Maternal (Signed)
CLINICAL SOCIAL WORK MATERNAL/CHILD NOTE  Patient Details  Name: Lauren Allen MRN: [id number] Date of Birth: [birth year]  Date:  12/05/2016  Clinical Social Worker Initiating Note:  Laurey Arrow Date/ Time Initiated:  12/05/16/1102     Child's Name:  Lauren Allen   Legal Guardian:  Mother (FOB is Lauren Allen 09/24/90 [phone])   Need for Interpreter:  None   Date of Referral:  12/05/16     Reason for Referral:  Behavioral Health Issues, including SI , Current Substance Use/Substance Use During Pregnancy  (hx of anxiety and hx of opiate abuse)   Referral Source:  Central Nursery   Address:  [address] [place] [address]  Phone number:  [phone]   Household Members:  Self, Spouse, Relatives, Parents (MOB and FOB resides with MOB's parents Lauren Allen [phone]) and Lauren Allen [phone]) Lauren Allen.)   Natural Supports (not living in the home):  Immediate Family, Extended Family   Professional Supports: Therapist, Case Manager/Social Worker (MOB has a Transport planner and a Tourist information centre manager at [employer].)   Employment: Unemployed   Type of Work:     Education:  Database administrator Resources:  Medicaid (MOB was provided information to apply for [employer]. )   Other Resources:  [facility]   Cultural/Religious Considerations Which May Impact Care:  Per MOB's Face Sheet, MOB attends [employer].  Strengths:  Pediatrician chosen , Home prepared for child , Understanding of illness, Compliance with medical plan , Ability to meet basic needs  (MOB has chosen [facility]. (Dr. Suzan Slick))   Risk Factors/Current Problems:  Substance Use , DHHS Involvement , Mental Health Concerns    Cognitive State:  Able to Concentrate , Alert , Insightful , Linear Thinking , Goal Oriented    Mood/Affect:  Calm , Tearful , Relaxed , Interested , Comfortable    CSW Assessment: CSW met with MOB and FOB in room 126 for a consult for hx of SA and hx of  anxiety.  When CSW arrived, MOB and FOB communicated that they have been waiting patiently to meet with CSW.  MOB provided CSW permission to meet with MOB while FOB Lauren Allen [birth year]) was present. MOB was engaged in skin to skin during the assessment and was very attentive and appropriate to responding to infant's cues. MOB was polite, honest, and receptive to meeting with CSW.  FOB appeared anxious and overwhelmed, but also receptive to meeting with CSW.  CSW explained CSW's role and encouraged the family to ask questions.   CSW inquired about MOB's MH hx and MOB initially denied having a hx of anxiety.  However, after CSW informed MOB of anxiety signs and symptoms MOB disclosed anxiety in 2011 and reported not being prescribed any medications. MOB appeared to have insight and awareness about MOB 's MH and denied SI and HI. CSW provided education regarding Baby Blues vs PMADs and provided MOB with information about support groups held at [place] encouraged MOB to evaluate her mental health throughout the postpartum period with the use of the New Mom Checklist developed by Postpartum Progress and notify a medical professional if symptoms arise.  MOB communicated MOB feels comfortable reaching out for help if help is needed.  CSW also asked about MOB's SA hx.  MOB reported MOB is an established patient with [facility] and goes daily for Methadone treatment.  MOB also reported that MOB has a Retail banker that MOB meets with weekly to address  MOB's SA and MH needs and concerns.  CSW informed MOB of hospital SA policy and MOB and FOB was understanding.  CSW made the family aware that infant had a positive UDS for opiates and CSW will be making a report to [place].  The Parent's was not surprise and was forthcoming about their substance addictions. MOB and FOB reported their last use of heroin was Friday, December 01, 2016. CSW thanked the couple for being honest and  encouraged them to continue their journey towards sobriety. MOB admitted to several relapses throughout pregnancy and became tearful sharing her addiction story. FOB was supportive of MOB and communicated his addiction journey as well.    The couple had several questions about CPS involvement and CSW explained the role of a CPS worker and the process of a CPS investigation.   MOB and FOB communicated a wealth of supports from MOB and FOB's immediate and extended family members. MOB reported that MOB and FOB is currently residing with FOB's parents however they are not aware of MOB's substance use.  FOB reported that he has been forthcoming with his parents about his addiction but has not communicated to them about MOB's addictions.  CSW encouraged MOB and FOB to be honest about MOB's addictions due to CPS involvement; the parent's agreed.   MOB and FOB reported having all necessary items for infant and feeling prepared to parent.  CSW provided SIDS education and MOB and FOB responded to appropriately to CSW questions.   CSW made a report to [facility] CPS.  At this time there are barriers to d/c until CPS provide CSW with a safety disposition plan for infant.   CSW Plan/Description:  Information/Referral to [employer] , Engineer, mining , Child Copy Report  (CSW will monitor infant's CDS and will report results to [place]. )   Laurey Arrow, MSW, LCSW Clinical Social Work [phone]   Dimple Nanas, [place] 12/05/2016, 12:08 PM

## 2016-12-05 NOTE — Plan of Care (Signed)
Problem: Physical Regulation: Goal: Ability to maintain clinical measurements within normal limits will improve Outcome: Progressing Contacted Dr Jackelyn Knife concerning the possibility of retesting pt's HIV status in light of recent heroin use

## 2016-12-06 ENCOUNTER — Encounter (HOSPITAL_COMMUNITY): Payer: Self-pay | Admitting: *Deleted

## 2016-12-06 LAB — RAPID HIV SCREEN (HIV 1/2 AB+AG)
HIV 1/2 ANTIBODIES: NONREACTIVE
HIV-1 P24 ANTIGEN - HIV24: NONREACTIVE

## 2016-12-06 MED ORDER — IBUPROFEN 600 MG PO TABS: 600.0000 mg | ORAL_TABLET | Freq: Four times a day (QID) | ORAL | 1 refills | Status: DC | PRN

## 2016-12-06 MED ORDER — SERTRALINE HCL 50 MG PO TABS: 50.0000 mg | ORAL_TABLET | Freq: Every day | ORAL | 2 refills | Status: DC

## 2016-12-06 NOTE — Progress Notes (Signed)
Patient ID: Lauren Allen, female   DOB: 1990/05/01, 26 y.o.   MRN: [id number] Pt doing well. Relieved to have told family the truth about her drug use - they have been very supportive. Pain controlled, lochia mild. No CP/SOB or lightheadedness. Pumping - baby in NICU. Feels ready for discharge to home VSS ABD - FF  EXT - no homans  A/P: PPD#2 s/p svd          Methadone use/ drug abuse - stable         Discharge to home- instructions reviewed

## 2016-12-06 NOTE — Progress Notes (Signed)
Patient ID: Lauren Allen, female   DOB: 1990-10-24, 26 y.o.   MRN: [id number] Pt not in room - visiting baby in NICU Will return to round

## 2016-12-06 NOTE — Discharge Summary (Signed)
OB Discharge Summary     Patient Name: Lauren Allen DOB: 1990-11-09 MRN: [id number]  Date of admission: 12/03/2016 Delivering MD: Pryor Ochoa [facility]   Date of discharge: 12/06/2016  Admitting diagnosis: 37wks water broke Intrauterine pregnancy: [redacted]w[redacted]d     Secondary diagnosis:  Active Problems:   Term pregnancy   SVD (spontaneous vaginal delivery)   Postpartum care following vaginal delivery  Additional problems: substance abuse     Discharge diagnosis: Term Pregnancy Delivered                                                                                                Post partum procedures:none  Augmentation: Pitocin  Complications: None  Hospital course:  Onset of Labor With Vaginal Delivery     26 y.o. yo G1P1001 at [redacted]w[redacted]d was admitted in Latent Labor on 12/03/2016. Patient had an uncomplicated labor course as follows:  Membrane Rupture Time/Date: 9:00 AM ,12/03/2016   Intrapartum Procedures: Episiotomy: None [1]                                         Lacerations:  1st degree [2];Vaginal [6]  Patient had a delivery of a Viable infant. 12/04/2016  Information for the patient's newborn:  Abria, Vannostrand [[id number]]  Delivery Method: Vaginal, Spontaneous Delivery (Filed from Delivery Summary)    Pateint had an uncomplicated postpartum course.  She is ambulating, tolerating a regular diet, passing flatus, and urinating well. Patient is discharged home in stable condition on 12/06/16.   Physical exam  Vitals:   12/04/16 1700 12/05/16 0515 12/05/16 1845 12/06/16 0628  BP: 100/61 102/71 119/81 110/69  Pulse: 75 63 (!) 59 79  Resp: 18 18 18 18   Temp: 98.6 F (37 C) 98.5 F (36.9 C) 98 F (36.7 C) 98 F (36.7 C)  TempSrc: Oral Oral Oral Oral  SpO2:   99% 99%  Weight:      Height:       General: alert, cooperative and no distress Lochia: appropriate Uterine Fundus: firm Incision: N/A DVT Evaluation: No evidence of DVT seen on physical  exam. Labs: Lab Results  Component Value Date   WBC 12.4 (H) 12/05/2016   HGB 10.9 (L) 12/05/2016   HCT 32.7 (L) 12/05/2016   MCV 81.1 12/05/2016   PLT 156 12/05/2016   CMP Latest Ref Rng & Units 11/06/2016  Glucose 65 - 99 mg/dL 85  BUN 6 - 20 mg/dL 13  Creatinine 6.21 - 3.08 mg/dL 6.57  Sodium 846 - 962 mmol/L 136  Potassium 3.5 - 5.1 mmol/L 3.7  Chloride 101 - 111 mmol/L 105  CO2 22 - 32 mmol/L 23  Calcium 8.9 - 10.3 mg/dL 9.5(M)  Total Protein 6.5 - 8.1 g/dL -  Total Bilirubin 0.3 - 1.2 mg/dL -  Alkaline Phos 38 - 841 U/L -  AST 15 - 41 U/L -  ALT 14 - 54 U/L -    Discharge instruction: per After Visit Summary and "Baby and Me Booklet".  After  visit meds:  Allergies as of 12/06/2016      Reactions   Sulfa Antibiotics Palpitations      Medication List    TAKE these medications   ibuprofen 600 MG tablet Commonly known as:  ADVIL,MOTRIN Take 1 tablet (600 mg total) by mouth every 6 (six) hours as needed.   methadone 10 MG/ML solution Commonly known as:  DOLOPHINE Take 110 mg by mouth daily.   oxymetazoline 0.05 % nasal spray Commonly known as:  AFRIN Place 2 sprays into both nostrils 2 (two) times daily as needed for congestion.   PRENATAL COMPLETE 14-0.4 MG Tabs Take 1 tablet by mouth daily.   ranitidine 75 MG tablet Commonly known as:  ZANTAC Take 75 mg by mouth 2 (two) times daily as needed for heartburn.       Diet: routine diet  Activity: Advance as tolerated. Pelvic rest for 6 weeks.   Outpatient follow up:2 weeks Follow up Appt:No future appointments. Follow up Visit:No Follow-up on file.  Postpartum contraception: Not Discussed  Newborn Data: Live born female  Birth Weight: 6 lb 1.5 oz (2765 g) APGAR: 9, 9  Baby Feeding: Breast Disposition:NICU   12/06/2016 Cathrine Muster, DO

## 2016-12-06 NOTE — Discharge Instructions (Signed)
Nothing in vagina for 6 weeks.  No sex, tampons, and douching.  Other instructions as in [facility] Discharge Booklet. °

## 2017-08-01 ENCOUNTER — Encounter (HOSPITAL_COMMUNITY): Payer: Self-pay | Admitting: Emergency Medicine

## 2017-08-01 ENCOUNTER — Emergency Department (HOSPITAL_COMMUNITY)
Admission: EM | Admit: 2017-08-01 | Discharge: 2017-08-01 | Disposition: A | Discharge status: Home / Self Care | Payer: Medicaid Other | Attending: Emergency Medicine | Admitting: Emergency Medicine

## 2017-08-01 DIAGNOSIS — R112 Nausea with vomiting, unspecified: Secondary | ICD-10-CM | POA: Insufficient documentation

## 2017-08-01 DIAGNOSIS — E860 Dehydration: Secondary | ICD-10-CM | POA: Insufficient documentation

## 2017-08-01 DIAGNOSIS — Z87891 Personal history of nicotine dependence: Secondary | ICD-10-CM | POA: Diagnosis not present

## 2017-08-01 DIAGNOSIS — R197 Diarrhea, unspecified: Secondary | ICD-10-CM | POA: Diagnosis not present

## 2017-08-01 LAB — URINALYSIS, ROUTINE W REFLEX MICROSCOPIC
Bilirubin Urine: NEGATIVE
GLUCOSE, UA: NEGATIVE mg/dL
Hgb urine dipstick: NEGATIVE
Ketones, ur: NEGATIVE mg/dL
Nitrite: NEGATIVE
PH: 7 (ref 5.0–8.0)
Protein, ur: NEGATIVE mg/dL
Specific Gravity, Urine: 1.004 — ABNORMAL LOW (ref 1.005–1.030)

## 2017-08-01 LAB — COMPREHENSIVE METABOLIC PANEL
ALK PHOS: 90 U/L (ref 38–126)
ALT: 13 U/L — AB (ref 14–54)
AST: 18 U/L (ref 15–41)
Albumin: 4.1 g/dL (ref 3.5–5.0)
Anion gap: 10 (ref 5–15)
BUN: 7 mg/dL (ref 6–20)
CALCIUM: 9.4 mg/dL (ref 8.9–10.3)
CHLORIDE: 100 mmol/L — AB (ref 101–111)
CO2: 28 mmol/L (ref 22–32)
CREATININE: 0.67 mg/dL (ref 0.44–1.00)
GFR calc Af Amer: >60 mL/min (ref >60)
Glucose, Bld: 109 mg/dL — ABNORMAL HIGH (ref 65–99)
Potassium: 3.8 mmol/L (ref 3.5–5.1)
Sodium: 138 mmol/L (ref 135–145)
Total Bilirubin: 0.6 mg/dL (ref 0.3–1.2)
Total Protein: 8 g/dL (ref 6.5–8.1)

## 2017-08-01 LAB — PREGNANCY, URINE: PREG TEST UR: NEGATIVE

## 2017-08-01 LAB — CBC
HCT: 40 % (ref 36.0–46.0)
Hemoglobin: 13.2 g/dL (ref 12.0–15.0)
MCH: 27.1 pg (ref 26.0–34.0)
MCHC: 33 g/dL (ref 30.0–36.0)
MCV: 82.1 fL (ref 78.0–100.0)
PLATELETS: 315 10*3/uL (ref 150–400)
RBC: 4.87 MIL/uL (ref 3.87–5.11)
RDW: 13.7 % (ref 11.5–15.5)
WBC: 12.5 10*3/uL — AB (ref 4.0–10.5)

## 2017-08-01 LAB — LIPASE, BLOOD: LIPASE: 52 U/L — AB (ref 11–51)

## 2017-08-01 LAB — I-STAT BETA HCG BLOOD, ED (MC, WL, AP ONLY): I-stat hCG, quantitative: <5 m[IU]/mL (ref <5)

## 2017-08-01 MED ORDER — SODIUM CHLORIDE 0.9 % IV BOLUS
1000.0000 mL | Freq: Once | INTRAVENOUS | Status: AC
  Administered 2017-08-01: 1000 mL via INTRAVENOUS

## 2017-08-01 MED ORDER — ONDANSETRON 4 MG PO TBDP: 4.0000 mg | ORAL_TABLET | Freq: Three times a day (TID) | ORAL | 0 refills | Status: DC | PRN

## 2017-08-01 MED ORDER — DIPHENHYDRAMINE HCL 50 MG/ML IJ SOLN
25.0000 mg | Freq: Once | INTRAMUSCULAR | Status: AC
  Administered 2017-08-01: 25 mg via INTRAVENOUS
  Filled 2017-08-01: qty 1

## 2017-08-01 MED ORDER — PROCHLORPERAZINE EDISYLATE 5 MG/ML IJ SOLN
10.0000 mg | Freq: Once | INTRAMUSCULAR | Status: AC
  Administered 2017-08-01: 10 mg via INTRAVENOUS
  Filled 2017-08-01: qty 2

## 2017-08-01 MED ORDER — ONDANSETRON 4 MG PO TBDP
4.0000 mg | ORAL_TABLET | Freq: Once | ORAL | Status: AC | PRN
  Administered 2017-08-01: 4 mg via ORAL
  Filled 2017-08-01: qty 1

## 2017-08-01 NOTE — ED Notes (Signed)
Attempted IV access x2, unsuccessful.

## 2017-08-01 NOTE — ED Triage Notes (Signed)
Pt c/o n/v/d for 3 days. reports very fatigued, hot flashes and cold chills and headache. Reports in recovery and currently on Methadone.

## 2017-08-01 NOTE — ED Provider Notes (Signed)
[facility]-EMERGENCY DEPT Provider Note   CSN: [id number] Arrival date & time: 08/01/17  0719     History   Chief Complaint Chief Complaint  Patient presents with  . Emesis  . Headache  . Diarrhea    HPI Lauren Allen is a 27 y.o. female.  HPI   2 days fatigue, nausea, vomiting and diarrhea started yesterday Mild cough Temperature 100.1, chills Feels having hunger pains Headache above left eye, feels sensitive to light, started slowly got worse, hx of headaches and migraines, feels like could be Doesn't feel like Crohn's, has been in remission for 4 years At work some people had stomach flu and stayed home Has 38mos son   Past Medical History:  Diagnosis Date  . Anxiety   . Crohn's disease (HCC)   . Depression   . Headache(784.0)   . Ileitis   . Opiate addiction [facility])     Patient Active Problem List   Diagnosis Date Noted  . SVD (spontaneous vaginal delivery) 12/04/2016  . Postpartum care following vaginal delivery 12/04/2016  . Term pregnancy 12/03/2016  . Opiate addiction (HCC) 11/06/2016  . Abscess of left hand 11/06/2016  . Pregnancy and infectious disease, third trimester 11/06/2016  . Sepsis (HCC) 11/06/2016  . Post-operative state 11/06/2016  . Heroin abuse (HCC) 09/13/2016  . ANXIETY 06/30/2009  . DEPRESSION 06/30/2009    Past Surgical History:  Procedure Laterality Date  . COLONOSCOPY    . I&D EXTREMITY Left 11/06/2016   Procedure: IRRIGATION AND DEBRIDEMENT LEFT HAND;  Surgeon: Bradly Bienenstock, MD;  Location: [facility] OR;  Service: Orthopedics;  Laterality: Left;  . I&D EXTREMITY Left 11/08/2016   Procedure: IRRIGATION AND DEBRIDEMENT LEFT HAND;  Surgeon: Bradly Bienenstock, MD;  Location: [facility] OR;  Service: Orthopedics;  Laterality: Left;  . TONSILLECTOMY       OB History    Gravida  1   Para  1   Term  1   Preterm  0   AB  0   Living  1     SAB      TAB      Ectopic      Multiple  0   Live Births  1            Home Medications    Prior to Admission medications   Medication Sig Start Date End Date Taking? Authorizing Provider  ibuprofen (ADVIL,MOTRIN) 200 MG tablet Take 400 mg by mouth every 6 (six) hours as needed for mild pain or moderate pain.   Yes [provider]  methadone (DOLOPHINE) 10 MG/ML solution Take 145 mg by mouth daily.    Yes [provider]  ibuprofen (ADVIL,MOTRIN) 600 MG tablet Take 1 tablet (600 mg total) by mouth every 6 (six) hours as needed. Patient not taking: Reported on 08/01/2017 12/06/16   Pryor Ochoa Worema, DO  ondansetron (ZOFRAN ODT) 4 MG disintegrating tablet Take 1 tablet (4 mg total) by mouth every 8 (eight) hours as needed for nausea or vomiting. 08/01/17   Alvira Monday, MD  oxymetazoline (AFRIN) 0.05 % nasal spray Place 2 sprays into both nostrils 2 (two) times daily as needed for congestion.    [provider]  Prenatal Vit-Fe Fumarate-FA (PRENATAL COMPLETE) 14-0.4 MG TABS Take 1 tablet by mouth daily. Patient not taking: Reported on 08/01/2017 09/13/16   Shaune Pollack, MD  sertraline (ZOLOFT) 50 MG tablet Take 1 tablet (50 mg total) by mouth daily. Patient not taking: Reported on 08/01/2017 12/06/16  Edwinna Areola, DO    Family History Family History  Problem Relation Age of Onset  . Colon cancer Maternal Grandfather   . Celiac disease Maternal Aunt   . Diabetes Maternal Grandmother   . Heart disease Unknown     Social History Social History   Tobacco Use  . Smoking status: Former Smoker    Packs/day: 0.50    Types: Cigarettes    Last attempt to quit: 12/01/2016    Years since quitting: 0.6  . Smokeless tobacco: Never Used  Substance Use Topics  . Alcohol use: Yes    Comment: occasional, not during pregnancy  . Drug use: Yes    Comment: Heroine      Allergies   Sulfa antibiotics   Review of Systems Review of Systems  Constitutional: Positive for chills and fever.  HENT: Negative for sore  throat.   Eyes: Negative for visual disturbance.  Respiratory: Negative for cough and shortness of breath.   Cardiovascular: Negative for chest pain.  Gastrointestinal: Positive for diarrhea, nausea and vomiting. Negative for abdominal pain.  Genitourinary: Negative for difficulty urinating.  Musculoskeletal: Positive for myalgias. Negative for back pain and neck pain.  Skin: Negative for rash.  Neurological: Positive for headaches. Negative for syncope.     Physical Exam Updated Vital Signs BP 102/73   Pulse (!) 48   Temp 99.1 F (37.3 C) (Oral)   Resp 16   Ht 5\' 3"  (1.6 m)   Wt 81.6 kg (180 lb)   LMP 07/11/2017   SpO2 97%   BMI 31.89 kg/m   Physical Exam  Constitutional: She is oriented to person, place, and time. She appears well-developed and well-nourished. No distress.  HENT:  Head: Normocephalic and atraumatic.  Eyes: Conjunctivae and EOM are normal.  Neck: Normal range of motion.  Cardiovascular: Normal rate, regular rhythm, normal heart sounds and intact distal pulses. Exam reveals no gallop and no friction rub.  No murmur heard. Pulmonary/Chest: Effort normal and breath sounds normal. No respiratory distress. She has no wheezes. She has no rales.  Abdominal: Soft. She exhibits no distension. There is no tenderness. There is no guarding.  Musculoskeletal: She exhibits no edema or tenderness.  Neurological: She is alert and oriented to person, place, and time.  Skin: Skin is warm and dry. No rash noted. She is not diaphoretic. No erythema.  Nursing note and vitals reviewed.    ED Treatments / Results  Labs (all labs ordered are listed, but only abnormal results are displayed) Labs Reviewed  LIPASE, BLOOD - Abnormal; Notable for the following components:      Result Value   Lipase 52 (*)    All other components within normal limits  COMPREHENSIVE METABOLIC PANEL - Abnormal; Notable for the following components:   Chloride 100 (*)    Glucose, Bld 109 (*)     ALT 13 (*)    All other components within normal limits  CBC - Abnormal; Notable for the following components:   WBC 12.5 (*)    All other components within normal limits  URINALYSIS, ROUTINE W REFLEX MICROSCOPIC - Abnormal; Notable for the following components:   APPearance HAZY (*)    Specific Gravity, Urine 1.004 (*)    Leukocytes, UA SMALL (*)    Bacteria, UA FEW (*)    Squamous Epithelial / LPF 6-30 (*)    All other components within normal limits  PREGNANCY, URINE  I-STAT BETA HCG BLOOD, ED ([facility], [facility], [facility] ONLY)  EKG None  Radiology No results found.  Procedures Procedures (including critical care time)  Medications Ordered in ED Medications  ondansetron (ZOFRAN-ODT) disintegrating tablet 4 mg (4 mg Oral Given 08/01/17 0732)  sodium chloride 0.9 % bolus 1,000 mL (0 mLs Intravenous Stopped 08/01/17 1014)  prochlorperazine (COMPAZINE) injection 10 mg (10 mg Intravenous Given 08/01/17 0902)  diphenhydrAMINE (BENADRYL) injection 25 mg (25 mg Intravenous Given 08/01/17 0902)     Initial Impression / Assessment and Plan / ED Course  I have reviewed the triage vital signs and the nursing notes.  Pertinent labs & imaging results that were available during my care of the patient were reviewed by me and considered in my medical decision making (see chart for details).     26yo female with history of Crohn's disease in admission presents with nausea, vomiting and diarrhea.  Abdominal exam benign, have low suspicion for acute surgical abdominal process.  Patient afebrile here, without significant abdominal pain, pain does not feel like prior Crohn's flares, low suspicion for intra-abdominal abscess, or Crohn's at this time.  Given sick contacts at work, suspect most likely viral gastroenteritis.  Headache most likely migraine on history and physical exam.  No fevers, with slow onset, low suspicion for meningitis or subarachnoid hemorrhage.  Labs completed showed no significant  abnormalities.  Pregnancy test is negative.  Urinalysis shows no sign of infection.  Patient was given Compazine, Benadryl, IV fluids with improvement of her symptoms.    Recommend continued supportive care, patient was given a prescription for Zofran. Patient discharged in stable condition with understanding of reasons to return.    Final Clinical Impressions(s) / ED Diagnoses   Final diagnoses:  Dehydration  Nausea vomiting and diarrhea    ED Discharge Orders        Ordered    ondansetron (ZOFRAN ODT) 4 MG disintegrating tablet  Every 8 hours PRN     08/01/17 1056       Alvira Monday, MD 08/01/17 1709

## 2017-08-01 NOTE — ED Notes (Signed)
ED Provider at bedside. 

## 2017-08-01 NOTE — ED Notes (Signed)
Bed: WA06 Expected date:  Expected time:  Means of arrival:  Comments: 

## 2017-10-24 LAB — HEMOGLOBIN A1C: HEMOGLOBIN A1C: 5.4

## 2017-10-24 LAB — TSH: TSH: 1.73 (ref 0.41–5.90)

## 2017-10-24 LAB — BASIC METABOLIC PANEL: Glucose: 85

## 2017-12-31 ENCOUNTER — Emergency Department (HOSPITAL_BASED_OUTPATIENT_CLINIC_OR_DEPARTMENT_OTHER): Payer: Medicaid Other

## 2017-12-31 ENCOUNTER — Encounter (HOSPITAL_BASED_OUTPATIENT_CLINIC_OR_DEPARTMENT_OTHER): Payer: Self-pay | Admitting: *Deleted

## 2017-12-31 ENCOUNTER — Emergency Department (HOSPITAL_BASED_OUTPATIENT_CLINIC_OR_DEPARTMENT_OTHER)
Admission: EM | Admit: 2017-12-31 | Discharge: 2017-12-31 | Disposition: A | Discharge status: Home / Self Care | Payer: Medicaid Other | Attending: Emergency Medicine | Admitting: Emergency Medicine

## 2017-12-31 ENCOUNTER — Other Ambulatory Visit: Payer: Self-pay

## 2017-12-31 DIAGNOSIS — F329 Major depressive disorder, single episode, unspecified: Secondary | ICD-10-CM | POA: Diagnosis not present

## 2017-12-31 DIAGNOSIS — B9789 Other viral agents as the cause of diseases classified elsewhere: Secondary | ICD-10-CM

## 2017-12-31 DIAGNOSIS — F1120 Opioid dependence, uncomplicated: Secondary | ICD-10-CM | POA: Diagnosis not present

## 2017-12-31 DIAGNOSIS — Z79899 Other long term (current) drug therapy: Secondary | ICD-10-CM | POA: Insufficient documentation

## 2017-12-31 DIAGNOSIS — J069 Acute upper respiratory infection, unspecified: Secondary | ICD-10-CM | POA: Diagnosis not present

## 2017-12-31 DIAGNOSIS — R51 Headache: Secondary | ICD-10-CM | POA: Insufficient documentation

## 2017-12-31 DIAGNOSIS — F419 Anxiety disorder, unspecified: Secondary | ICD-10-CM | POA: Insufficient documentation

## 2017-12-31 DIAGNOSIS — Z87891 Personal history of nicotine dependence: Secondary | ICD-10-CM | POA: Diagnosis not present

## 2017-12-31 DIAGNOSIS — R519 Headache, unspecified: Secondary | ICD-10-CM

## 2017-12-31 LAB — PREGNANCY, URINE: PREG TEST UR: NEGATIVE

## 2017-12-31 MED ORDER — BENZONATATE 100 MG PO CAPS: 100.0000 mg | ORAL_CAPSULE | Freq: Three times a day (TID) | ORAL | 0 refills | Status: DC

## 2017-12-31 MED ORDER — DIPHENHYDRAMINE HCL 50 MG/ML IJ SOLN
25.0000 mg | Freq: Once | INTRAMUSCULAR | Status: AC
  Administered 2017-12-31: 25 mg via INTRAVENOUS
  Filled 2017-12-31: qty 1

## 2017-12-31 MED ORDER — SODIUM CHLORIDE 0.9 % IV BOLUS
1000.0000 mL | Freq: Once | INTRAVENOUS | Status: AC
  Administered 2017-12-31: 1000 mL via INTRAVENOUS

## 2017-12-31 MED ORDER — PROCHLORPERAZINE EDISYLATE 10 MG/2ML IJ SOLN
10.0000 mg | Freq: Once | INTRAMUSCULAR | Status: AC
Start: 2017-12-31 — End: 2017-12-31
  Administered 2017-12-31: 10 mg via INTRAVENOUS
  Filled 2017-12-31: qty 2

## 2017-12-31 NOTE — ED Provider Notes (Signed)
[facility] EMERGENCY DEPARTMENT Provider Note   CSN: [id number] Arrival date & time: 12/31/17  1409     History   Chief Complaint Chief Complaint  Patient presents with  . Migraine    HPI Lauren Allen is a 27 y.o. female with a history of migraines, opiate addiction on methadone and Crohn's disease who presents emergency department today for headache.  Patient reports that she began having a headache in her left temple that she describes as slow in onset, gradually worsening, throbbing in nature and similar to prior headaches.  She rates her current pain levels a 4/10.  She notes associated photophobia, nausea but no emesis.  She reports that she is taken Tylenol for her symptoms without any relief.  She states she used to be on Topamax she would provide her relief but has not gotten a refill in some time.  Patient was given a barbiturate by her PCP was told not to take this by her methadone doctor so she did not.   Patient also reports that she has had a viral URI for the last 3 days.  She reports that her son was diagnosed with one at his pediatrician this morning.  She reports to triage staff that she had a fever yesterday but when asking her temperature he states this was 98.7.  She is afebrile here today.  She denies any neck stiffness.  She reports she has associated nasal congestion, postnasal drip, sinus pressure as well as a dry, nonproductive cough.  She has not been taking anything for her symptoms.  She notes that nothing makes her symptoms better or worse.  She denies any tinnitus, sore throat, chest pain, shortness of breath, hemoptysis, lower extremity swelling.  Headache pertinent negatives No fever, syncope, head trauma, visual changes, neck stiffness, neck pain, rash, seizure, jaw claudication, or "thunderclap" onset. Denies vertigo, dizziness, unilateral weakness/numbness/tingling. Not first HA. Not worst HA of life. Similar to previous headaches in the past. No  one in party with similar HA or exposure that would suggest CO poisoning.    HPI  Past Medical History:  Diagnosis Date  . Anxiety   . Crohn's disease (HCC)   . Depression   . Headache(784.0)   . Ileitis   . Opiate addiction [facility])     Patient Active Problem List   Diagnosis Date Noted  . SVD (spontaneous vaginal delivery) 12/04/2016  . Postpartum care following vaginal delivery 12/04/2016  . Term pregnancy 12/03/2016  . Opiate addiction (HCC) 11/06/2016  . Abscess of left hand 11/06/2016  . Pregnancy and infectious disease, third trimester 11/06/2016  . Sepsis (HCC) 11/06/2016  . Post-operative state 11/06/2016  . Heroin abuse (HCC) 09/13/2016  . ANXIETY 06/30/2009  . DEPRESSION 06/30/2009    Past Surgical History:  Procedure Laterality Date  . COLONOSCOPY    . I&D EXTREMITY Left 11/06/2016   Procedure: IRRIGATION AND DEBRIDEMENT LEFT HAND;  Surgeon: Bradly Bienenstock, MD;  Location: [facility] OR;  Service: Orthopedics;  Laterality: Left;  . I&D EXTREMITY Left 11/08/2016   Procedure: IRRIGATION AND DEBRIDEMENT LEFT HAND;  Surgeon: Bradly Bienenstock, MD;  Location: [facility] OR;  Service: Orthopedics;  Laterality: Left;  . TONSILLECTOMY       OB History    Gravida  1   Para  1   Term  1   Preterm  0   AB  0   Living  1     SAB      TAB  Ectopic      Multiple  0   Live Births  1            Home Medications    Prior to Admission medications   Medication Sig Start Date End Date Taking? Authorizing Provider  ibuprofen (ADVIL,MOTRIN) 200 MG tablet Take 400 mg by mouth every 6 (six) hours as needed for mild pain or moderate pain.   Yes [provider]  Levothyroxine Sodium (SYNTHROID PO) Take by mouth.   Yes [provider]  METFORMIN HCL PO Take by mouth.   Yes [provider]  methadone (DOLOPHINE) 10 MG/ML solution Take 145 mg by mouth daily.    Yes [provider]  ibuprofen (ADVIL,MOTRIN) 600 MG tablet Take 1 tablet (600 mg  total) by mouth every 6 (six) hours as needed. Patient not taking: Reported on 08/01/2017 12/06/16   Pryor Ochoa Worema, DO  ondansetron (ZOFRAN ODT) 4 MG disintegrating tablet Take 1 tablet (4 mg total) by mouth every 8 (eight) hours as needed for nausea or vomiting. 08/01/17   Alvira Monday, MD  oxymetazoline (AFRIN) 0.05 % nasal spray Place 2 sprays into both nostrils 2 (two) times daily as needed for congestion.    [provider]  Prenatal Vit-Fe Fumarate-FA (PRENATAL COMPLETE) 14-0.4 MG TABS Take 1 tablet by mouth daily. Patient not taking: Reported on 08/01/2017 09/13/16   Shaune Pollack, MD  sertraline (ZOLOFT) 50 MG tablet Take 1 tablet (50 mg total) by mouth daily. Patient not taking: Reported on 08/01/2017 12/06/16   Edwinna Areola, DO    Family History Family History  Problem Relation Age of Onset  . Colon cancer Maternal Grandfather   . Celiac disease Maternal Aunt   . Diabetes Maternal Grandmother   . Heart disease Unknown   . Diabetes Mother   . Hypertension Mother   . Hypothyroidism Mother   . Hypertension Father   . Heart disease Father     Social History Social History   Tobacco Use  . Smoking status: Former Smoker    Packs/day: 0.50    Types: Cigarettes    Last attempt to quit: 12/01/2016    Years since quitting: 1.0  . Smokeless tobacco: Never Used  Substance Use Topics  . Alcohol use: Yes    Comment: occasional, not during pregnancy  . Drug use: Yes    Comment: Heroine      Allergies   Sulfa antibiotics   Review of Systems Review of Systems  All other systems reviewed and are negative.    Physical Exam Updated Vital Signs BP 110/64   Pulse 78   Temp 98.6 F (37 C) (Oral)   Resp 18   Ht 5\' 3"  (1.6 m)   Wt 86.2 kg   LMP 12/24/2017   SpO2 98%   BMI 33.66 kg/m   Physical Exam  Constitutional: She appears well-developed and well-nourished.  HENT:  Head: Normocephalic and atraumatic.  Right Ear: Tympanic membrane,  external ear and ear canal normal.  Left Ear: Tympanic membrane, external ear and ear canal normal.  Nose: Mucosal edema present. Right sinus exhibits no maxillary sinus tenderness and no frontal sinus tenderness. Left sinus exhibits no maxillary sinus tenderness and no frontal sinus tenderness.  Mouth/Throat: Uvula is midline, oropharynx is clear and moist and mucous membranes are normal. No tonsillar exudate.  The patient has normal phonation and is in control of secretions. No stridor.  Midline uvula without edema. Soft palate rises symmetrically.  No tonsillar  erythema or exudates. No PTA. Tongue protrusion is normal. No trismus. No creptius on neck palpation and patient has good dentition. No gingival erythema or fluctuance noted. Mucus membranes moist.   Eyes: Pupils are equal, round, and reactive to light. Right eye exhibits no discharge. Left eye exhibits no discharge. No scleral icterus.  Normal EOM. No vertical or rotatory nystagmus.   Neck: Trachea normal. Neck supple. No spinous process tenderness present. Carotid bruit is not present. No neck rigidity. Normal range of motion present.  No nuchal rigidity or meningismus  Cardiovascular: Normal rate, regular rhythm and intact distal pulses.  No murmur heard. Pulses:      Radial pulses are 2+ on the right side, and 2+ on the left side.       Dorsalis pedis pulses are 2+ on the right side, and 2+ on the left side.       Posterior tibial pulses are 2+ on the right side, and 2+ on the left side.  No lower extremity swelling or edema. Calves symmetric in size bilaterally.  Pulmonary/Chest: Effort normal and breath sounds normal. She exhibits no tenderness.  Abdominal: Soft. Bowel sounds are normal. There is no tenderness. There is no rebound and no guarding.  Musculoskeletal: She exhibits no edema.  Lymphadenopathy:    She has no cervical adenopathy.  Neurological: She is alert.  Mental Status:  Alert, oriented, thought content  appropriate, able to give a coherent history. Speech fluent without evidence of aphasia. Able to follow 2 step commands without difficulty.  Cranial Nerves:  II:  Peripheral visual fields grossly normal, pupils equal, round, reactive to light III,IV, VI: ptosis not present, extra-ocular motions intact bilaterally  V,VII: smile symmetric, eyebrows raise symmetric, facial light touch sensation equal VIII: hearing grossly normal to voice  X: uvula elevates symmetrically  XI: bilateral shoulder shrug symmetric and strong XII: midline tongue extension without fassiculations Motor:  Normal tone. 5/5 in upper and lower extremities bilaterally including strong and equal grip strength and dorsiflexion/plantar flexion Sensory: Sensation intact to light touch in all extremities. Negative Romberg.  Deep Tendon Reflexes: 2+ and symmetric in the biceps and patella Cerebellar: normal finger-to-nose with bilateral upper extremities. Normal heel-to -shin balance bilaterally of the lower extremity. No pronator drift.  Gait: normal gait and balance CV: distal pulses palpable throughout   Skin: Skin is warm and dry. No rash noted. She is not diaphoretic.  No rash  Psychiatric: She has a normal mood and affect.  Nursing note and vitals reviewed.    ED Treatments / Results  Labs (all labs ordered are listed, but only abnormal results are displayed) Labs Reviewed  PREGNANCY, URINE    EKG None  Radiology Dg Chest 2 View  Result Date: 12/31/2017 CLINICAL DATA:  Cough, chest congestion, and low-grade fever associated with malaise for the past 3 days. History of substance abuse, sepsis, former tobacco smoker. EXAM: CHEST - 2 VIEW COMPARISON:  Chest x-ray of June 06, 2007 FINDINGS: The lungs are adequately inflated. There is no focal infiltrate. There is no pleural effusion. The heart and pulmonary vascularity are normal. The mediastinum is normal in width. There is no pleural effusion. There is stable  mild dextrocurvature centered at T7. IMPRESSION: There is no active cardiopulmonary disease. Electronically Signed   By: David  [place] M.D.   On: 12/31/2017 16:33    Procedures Procedures (including critical care time)  Medications Ordered in ED Medications  sodium chloride 0.9 % bolus 1,000 mL (1,000 mLs Intravenous New  Bag/Given 12/31/17 1613)  prochlorperazine (COMPAZINE) injection 10 mg (10 mg Intravenous Given 12/31/17 1615)  diphenhydrAMINE (BENADRYL) injection 25 mg (25 mg Intravenous Given 12/31/17 1613)     Initial Impression / Assessment and Plan / ED Course  I have reviewed the triage vital signs and the nursing notes.  Pertinent labs & imaging results that were available during my care of the patient were reviewed by me and considered in my medical decision making (see chart for details).     28 y.o. female presenting with headache and cough.   Pt HA treated and improved while in ED.  Presentation is like pts typical HA and non concerning for [facility], ICH, Meningitis, or temporal arteritis. Pt is afebrile with no focal neuro deficits, nuchal rigidity, or change in vision. Pt is to follow up with PCP or neurology to discuss prophylactic medication. Pt verbalizes understanding and is agreeable with plan to dc.  Return precautions discussed.  Pt CXR negative for acute infiltrate. Patients symptoms are consistent with URI, likely viral etiology. Discussed that antibiotics are not indicated for viral infections. Pt will be discharged with symptomatic treatment.  Verbalizes understanding and is agreeable with plan. Pt is hemodynamically stable & in NAD prior to dc.  Final Clinical Impressions(s) / ED Diagnoses   Final diagnoses:  Bad headache  Viral URI with cough    ED Discharge Orders         Ordered    benzonatate (TESSALON) 100 MG capsule  Every 8 hours     12/31/17 1657           Princella Pellegrini 12/31/17 1911    Jacalyn Lefevre, MD 01/03/18 [phone]

## 2017-12-31 NOTE — ED Notes (Signed)
NAD at this time. Pt is stable and going home.  

## 2017-12-31 NOTE — ED Triage Notes (Addendum)
Migraine headache. She takes Methadone. Her MD given a Barbiturate by her MD but only took one then was told to stop by her substance abuse tx center. She also had a fever yesterday and does not know if she has something else going on other than her migraine.

## 2017-12-31 NOTE — Discharge Instructions (Addendum)
Follow up with neurology for further evaluation.   If you develop worsening or new concerning symptoms you can return to the emergency department for re-evaluation.

## 2018-01-04 ENCOUNTER — Ambulatory Visit: Payer: Managed Care, Other (non HMO) | Admitting: Gastroenterology

## 2018-01-04 ENCOUNTER — Other Ambulatory Visit (INDEPENDENT_AMBULATORY_CARE_PROVIDER_SITE_OTHER): Payer: Medicaid Other

## 2018-01-04 ENCOUNTER — Encounter: Payer: Self-pay | Admitting: Gastroenterology

## 2018-01-04 VITALS — BP 110/70 | HR 88 | Ht 63.5 in | Wt 217.1 lb

## 2018-01-04 DIAGNOSIS — Z8719 Personal history of other diseases of the digestive system: Secondary | ICD-10-CM

## 2018-01-04 DIAGNOSIS — R1013 Epigastric pain: Secondary | ICD-10-CM | POA: Diagnosis not present

## 2018-01-04 DIAGNOSIS — K5904 Chronic idiopathic constipation: Secondary | ICD-10-CM | POA: Diagnosis not present

## 2018-01-04 LAB — CBC WITH DIFFERENTIAL/PLATELET
Basophils Absolute: 0.1 10*3/uL (ref 0.0–0.1)
Basophils Relative: 0.5 % (ref 0.0–3.0)
Eosinophils Absolute: 0.1 10*3/uL (ref 0.0–0.7)
Eosinophils Relative: 0.5 % (ref 0.0–5.0)
HEMATOCRIT: 40.4 % (ref 36.0–46.0)
Hemoglobin: 13.4 g/dL (ref 12.0–15.0)
LYMPHS ABS: 2.3 10*3/uL (ref 0.7–4.0)
Lymphocytes Relative: 17.2 % (ref 12.0–46.0)
MCHC: 33.2 g/dL (ref 30.0–36.0)
MCV: 79.5 fl (ref 78.0–100.0)
MONOS PCT: 2.6 % — AB (ref 3.0–12.0)
Monocytes Absolute: 0.3 10*3/uL (ref 0.1–1.0)
NEUTROS ABS: 10.6 10*3/uL — AB (ref 1.4–7.7)
Neutrophils Relative %: 79.2 % — ABNORMAL HIGH (ref 43.0–77.0)
Platelets: 346 10*3/uL (ref 150.0–400.0)
RBC: 5.09 Mil/uL (ref 3.87–5.11)
RDW: 13.6 % (ref 11.5–15.5)
WBC: 13.4 10*3/uL — ABNORMAL HIGH (ref 4.0–10.5)

## 2018-01-04 LAB — COMPREHENSIVE METABOLIC PANEL
ALK PHOS: 109 U/L (ref 39–117)
ALT: 16 U/L (ref 0–35)
AST: 13 U/L (ref 0–37)
Albumin: 4.3 g/dL (ref 3.5–5.2)
BUN: 15 mg/dL (ref 6–23)
CO2: 28 mEq/L (ref 19–32)
Calcium: 9.9 mg/dL (ref 8.4–10.5)
Chloride: 99 mEq/L (ref 96–112)
Creatinine, Ser: 0.79 mg/dL (ref 0.40–1.20)
GFR: 92.23 mL/min (ref >60.00)
GLUCOSE: 124 mg/dL — AB (ref 70–99)
POTASSIUM: 4.3 meq/L (ref 3.5–5.1)
SODIUM: 137 meq/L (ref 135–145)
TOTAL PROTEIN: 8 g/dL (ref 6.0–8.3)
Total Bilirubin: 0.4 mg/dL (ref 0.2–1.2)

## 2018-01-04 LAB — SEDIMENTATION RATE: SED RATE: 37 mm/h — AB (ref 0–20)

## 2018-01-04 LAB — C-REACTIVE PROTEIN: CRP: 1.2 mg/dL (ref 0.5–20.0)

## 2018-01-04 LAB — LIPASE: Lipase: 52 U/L (ref 11.0–59.0)

## 2018-01-04 MED ORDER — OMEPRAZOLE 40 MG PO CPDR: 40.0000 mg | DELAYED_RELEASE_CAPSULE | Freq: Two times a day (BID) | ORAL | 11 refills | Status: DC

## 2018-01-04 NOTE — Patient Instructions (Signed)
Your provider has requested that you go to the basement level for lab work before leaving today. Press "B" on the elevator. The lab is located at the first door on the left as you exit the elevator.  Start over the counter Miralax mixing 17 grams in 8 oz of water daily for constipation.   Increase your omeprazole 40 mg to twice daily. We sent a new prescription to your pharmacy.   You have been scheduled for an abdominal ultrasound at [facility] (1st floor of hospital) on 01/08/18 at 10:00am. Please arrive 15 minutes prior to your appointment for registration. Make certain not to have anything to eat or drink 6 hours prior to your appointment. Should you need to reschedule your appointment, please contact radiology at [phone]. This test typically takes about 30 minutes to perform.  Thank you for choosing me and [facility].  Venita Lick. Pleas Koch., MD., Clementeen Graham

## 2018-01-04 NOTE — Progress Notes (Signed)
History of Present Illness: This is a 27 year old female referred by Heywood Bene, * for the evaluation of epigastric abdominal pain, constipation, history of Crohn's ileitis.  She is accompanied by her husband and baby son.  She relates a several month history of epigastric pain radiating to the back associated with nausea that occurs each evening.  She was placed on omeprazole daily without a change in symptoms.  She has ongoing constipation with a bowel movement about once per week.  She was diagnosed with Crohn's ileitis in 2013 by CT scan findings, colonoscopy and ileal biopsy results.  She was treated with budesonide for period of time.  She states her symptoms resolved so she discontinued budesonide and she did not return for GI follow-up.  She relates no ongoing gastrointestinal problems until few months ago.  She was recently diagnosed with hypothyroidism and levothyroxine was started.  She is undergoing evaluation for possible insulinoma due to an elevated insulin level and she is awaiting an appointment with an endocrinologist.  She has recently been evaluated for recurrent headaches. Denies weight loss, diarrhea, change in stool caliber, melena, hematochezia, vomiting, dysphagia, reflux symptoms, chest pain.   Allergies  Allergen Reactions  . Sulfa Antibiotics Palpitations   Outpatient Medications Prior to Visit  Medication Sig Dispense Refill  . levothyroxine (SYNTHROID, LEVOTHROID) 50 MCG tablet Take 1 tablet by mouth daily.    . metFORMIN (GLUCOPHAGE-XR) 500 MG 24 hr tablet Take 1 tablet by mouth daily.    . methadone (DOLOPHINE) 10 MG/ML solution Take 145 mg by mouth daily.     Marland Kitchen omeprazole (PRILOSEC) 40 MG capsule Take 1 capsule by mouth daily.    . Vitamin D, Ergocalciferol, (DRISDOL) 50000 units CAPS capsule Take 1 capsule by mouth once a week.    . benzonatate (TESSALON) 100 MG capsule Take 1 capsule (100 mg total) by mouth every 8 (eight) hours. 21 capsule 0  .  ibuprofen (ADVIL,MOTRIN) 200 MG tablet Take 400 mg by mouth every 6 (six) hours as needed for mild pain or moderate pain.    Marland Kitchen ibuprofen (ADVIL,MOTRIN) 600 MG tablet Take 1 tablet (600 mg total) by mouth every 6 (six) hours as needed. (Patient not taking: Reported on 08/01/2017) 40 tablet 1  . Levothyroxine Sodium (SYNTHROID PO) Take by mouth.    . METFORMIN HCL PO Take by mouth.    . ondansetron (ZOFRAN ODT) 4 MG disintegrating tablet Take 1 tablet (4 mg total) by mouth every 8 (eight) hours as needed for nausea or vomiting. 20 tablet 0  . oxymetazoline (AFRIN) 0.05 % nasal spray Place 2 sprays into both nostrils 2 (two) times daily as needed for congestion.    . Prenatal Vit-Fe Fumarate-FA (PRENATAL COMPLETE) 14-0.4 MG TABS Take 1 tablet by mouth daily. (Patient not taking: Reported on 08/01/2017) 60 each 0  . sertraline (ZOLOFT) 50 MG tablet Take 1 tablet (50 mg total) by mouth daily. (Patient not taking: Reported on 08/01/2017) 30 tablet 2   No facility-administered medications prior to visit.    Past Medical History:  Diagnosis Date  . Anxiety   . Crohn's disease ([place])   . Depression   . Headache(784.0)   . Ileitis   . Opiate addiction [facility])    Past Surgical History:  Procedure Laterality Date  . COLONOSCOPY    . HAND SURGERY Left   . I&D EXTREMITY Left 11/06/2016   Procedure: IRRIGATION AND DEBRIDEMENT LEFT HAND;  Surgeon: Iran Planas, MD;  Location:  [place] OR;  Service: Orthopedics;  Laterality: Left;  . I&D EXTREMITY Left 11/08/2016   Procedure: IRRIGATION AND DEBRIDEMENT LEFT HAND;  Surgeon: Iran Planas, MD;  Location: [place];  Service: Orthopedics;  Laterality: Left;  . TONSILLECTOMY     Social History   Socioeconomic History  . Marital status: Married    Spouse name: Not on file  . Number of children: Not on file  . Years of education: Not on file  . Highest education level: Not on file  Occupational History  . Not on file  Social Needs  . Financial resource strain: Not  on file  . Food insecurity:    Worry: Not on file    Inability: Not on file  . Transportation needs:    Medical: Not on file    Non-medical: Not on file  Tobacco Use  . Smoking status: Former Smoker    Packs/day: 0.50    Types: Cigarettes    Last attempt to quit: 12/01/2016    Years since quitting: 1.0  . Smokeless tobacco: Never Used  Substance and Sexual Activity  . Alcohol use: Yes    Comment: occasional, not during pregnancy  . Drug use: Yes    Comment: Heroine   . Sexual activity: Yes    Birth control/protection: None  Lifestyle  . Physical activity:    Days per week: Not on file    Minutes per session: Not on file  . Stress: Not on file  Relationships  . Social connections:    Talks on phone: Not on file    Gets together: Not on file    Attends religious service: Not on file    Active member of club or organization: Not on file    Attends meetings of clubs or organizations: Not on file    Relationship status: Not on file  Other Topics Concern  . Not on file  Social History Narrative   Has one coke a day   Family History  Problem Relation Age of Onset  . Colon cancer Maternal Grandfather   . Celiac disease Maternal Aunt   . Diabetes Maternal Grandmother   . Heart disease Unknown   . Diabetes Mother   . Hypertension Mother   . Hypothyroidism Mother   . Hypertension Father   . Heart disease Father        Review of Systems: Pertinent positive and negative review of systems were noted in the above HPI section. All other review of systems were otherwise negative.    Physical Exam: General: Well developed, well nourished, no acute distress Head: Normocephalic and atraumatic Eyes:  sclerae anicteric, EOMI Ears: Normal auditory acuity Mouth: No deformity or lesions Neck: Supple, no masses or thyromegaly Lu*ngs: Clear throughout to auscultation He-art: Regular rate and rhythm; no murmurs, rubs or bruits Abdomen: Soft, mild epigastric tenderness and non  distended. No masses, hepatosplenomegaly or hernias noted. Normal Bowel sounds Rectal: Not done Musculoskeletal: Symmetrical with no gross deformities  Skin: No lesions on visible extremities Pulses:  Normal pulses noted Extremities: No clubbing, cyanosis, edema or deformities noted Neurological: Alert oriented x 4, grossly nonfocal Cervical Nodes:  No significant cervical adenopathy Inguinal Nodes: No significant inguinal adenopathy Psychological:  Alert and cooperative. Normal mood and affect  Assessment and Recommendations:  1. Recurrent epigastric pain radiating to the back associated with nausea.  Rule out cholelithiasis, GERD, ulcer, gastritis.  Symptoms not typical for Crohn's disease however given her history it is a consideration.  Begin evaluation with  CMET, CBC, lipase, ESR, CRP, abdominal ultrasound and increasing omeprazole to 40 mg twice daily.  If no diagnosis is established and symptoms persist proceed with EGD and abdominal/pelvic CT.  2. Constipation.  Begin MiraLAX once or twice daily titrated for complete bowel movement every day or every other day.  3. History of Crohn's ileitis.    cc: Heywood Bene, PA-C [id number], [place] [address]

## 2018-01-08 ENCOUNTER — Ambulatory Visit (HOSPITAL_COMMUNITY)
Admission: RE | Admit: 2018-01-08 | Discharge: 2018-01-08 | Disposition: A | Discharge status: Home / Self Care | Payer: Medicaid Other | Source: Ambulatory Visit | Attending: Gastroenterology | Admitting: Gastroenterology

## 2018-01-08 ENCOUNTER — Other Ambulatory Visit: Payer: Self-pay

## 2018-01-08 DIAGNOSIS — K5904 Chronic idiopathic constipation: Secondary | ICD-10-CM | POA: Diagnosis not present

## 2018-01-08 DIAGNOSIS — D72829 Elevated white blood cell count, unspecified: Secondary | ICD-10-CM

## 2018-01-08 DIAGNOSIS — R1013 Epigastric pain: Secondary | ICD-10-CM

## 2018-01-08 DIAGNOSIS — Z8719 Personal history of other diseases of the digestive system: Secondary | ICD-10-CM

## 2018-01-08 DIAGNOSIS — R700 Elevated erythrocyte sedimentation rate: Secondary | ICD-10-CM

## 2018-01-15 ENCOUNTER — Inpatient Hospital Stay: Admission: RE | Admit: 2018-01-15 | Payer: Medicaid Other | Source: Ambulatory Visit

## 2018-01-17 ENCOUNTER — Inpatient Hospital Stay: Admission: RE | Admit: 2018-01-17 | Payer: Medicaid Other | Source: Ambulatory Visit

## 2018-01-30 ENCOUNTER — Ambulatory Visit (INDEPENDENT_AMBULATORY_CARE_PROVIDER_SITE_OTHER)
Admission: RE | Admit: 2018-01-30 | Discharge: 2018-01-30 | Disposition: A | Discharge status: Home / Self Care | Payer: Medicaid Other | Source: Ambulatory Visit | Attending: Gastroenterology | Admitting: Gastroenterology

## 2018-01-30 ENCOUNTER — Telehealth: Payer: Self-pay | Admitting: Gastroenterology

## 2018-01-30 DIAGNOSIS — R1013 Epigastric pain: Secondary | ICD-10-CM | POA: Diagnosis not present

## 2018-01-30 DIAGNOSIS — K5904 Chronic idiopathic constipation: Secondary | ICD-10-CM | POA: Diagnosis not present

## 2018-01-30 DIAGNOSIS — D72829 Elevated white blood cell count, unspecified: Secondary | ICD-10-CM

## 2018-01-30 DIAGNOSIS — R700 Elevated erythrocyte sedimentation rate: Secondary | ICD-10-CM

## 2018-01-30 DIAGNOSIS — Z8719 Personal history of other diseases of the digestive system: Secondary | ICD-10-CM

## 2018-01-30 MED ORDER — IOPAMIDOL (ISOVUE-300) INJECTION 61%
100.0000 mL | Freq: Once | INTRAVENOUS | Status: AC | PRN
  Administered 2018-01-30: 100 mL via INTRAVENOUS

## 2018-01-30 NOTE — Telephone Encounter (Signed)
Patient was instructed by radiology

## 2018-02-05 ENCOUNTER — Ambulatory Visit: Payer: Managed Care, Other (non HMO) | Admitting: Endocrinology

## 2018-02-13 ENCOUNTER — Ambulatory Visit: Payer: Medicaid Other | Admitting: Gastroenterology

## 2018-03-05 ENCOUNTER — Telehealth: Payer: Self-pay | Admitting: Gastroenterology

## 2018-03-05 NOTE — Telephone Encounter (Signed)
Follow up and mgmt of elevated glucose per her PCP We have not found a GI cause for her elevated WBC however it is stable. Further follow up and mgmt of this per her PCP.

## 2018-03-05 NOTE — Telephone Encounter (Signed)
Left message for patient to call back  

## 2018-03-05 NOTE — Telephone Encounter (Signed)
Please advise on patient request 

## 2018-03-06 NOTE — Telephone Encounter (Signed)
Called and spoke with patient-patient informed of MD recommendations for following up with PCP for management of glucose and monitoring of WBC;  Patient verbalized understanding of MD recommendations - pt stated she would follow up with PCP and patient advised if questions/concerns arise;

## 2018-03-15 ENCOUNTER — Telehealth: Payer: Self-pay | Admitting: Oncology

## 2018-03-15 ENCOUNTER — Encounter: Payer: Self-pay | Admitting: Oncology

## 2018-03-15 NOTE — Telephone Encounter (Signed)
New hem referral has been received from [place], [place] for leukocytosis. Pt has ben cld and scheduled to see Dr. Clelia Croft on 12/6 at 11am. Pt aware to arrive 30 minutes early. Letter mailed.

## 2018-03-19 ENCOUNTER — Ambulatory Visit: Payer: Medicaid Other | Admitting: Endocrinology

## 2018-03-19 ENCOUNTER — Encounter: Payer: Self-pay | Admitting: Endocrinology

## 2018-03-19 VITALS — BP 124/80 | HR 64 | Ht 63.5 in | Wt 225.8 lb

## 2018-03-19 DIAGNOSIS — E038 Other specified hypothyroidism: Secondary | ICD-10-CM

## 2018-03-19 DIAGNOSIS — E039 Hypothyroidism, unspecified: Secondary | ICD-10-CM | POA: Diagnosis not present

## 2018-03-19 DIAGNOSIS — Z131 Encounter for screening for diabetes mellitus: Secondary | ICD-10-CM

## 2018-03-19 NOTE — Progress Notes (Signed)
Patient ID: Lauren Allen, female   DOB: Sep 19, 1990, 27 y.o.   MRN: [id number]            Referring Provider: Roger Kill, PA-C  Reason for Appointment:  Hypothyroidism, new visit    History of Present Illness:   Hypothyroidism was first diagnosed in late 2018 a few months after her pregnancy  At the time of diagnosis patient was having symptoms of malaise, weight gain Her gynecologist checked her thyroid labs and told her that she needed a thyroid supplement  The patient has been treated with  levothyroxine 50 mcg daily  With starting thyroid supplementation she did not feel any different and has continued with weight gain She does not complain of any unusual fatigue She thinks she tends to feel warmer than normal  The patient takes the thyroid supplement before breakfast  TSH was done by PCP in July 2019 and was normal         Patient's weight history is as follows:  Wt Readings from Last 3 Encounters:  03/19/18 225 lb 12.8 oz (102.4 kg)  01/04/18 217 lb 2 oz (98.5 kg)  12/31/17 190 lb (86.2 kg)   Thyroid function results have been as follows:  Lab Results  Component Value Date   TSH 1.73 10/24/2017   TSH 1.49 05/19/2013   TSH 1.34 10/17/2011   PROBLEM 2  Prediabetes ? She was told after her delivery last year that her blood sugar was abnormal by her gynecologist However she did not have any glucose abnormalities during her pregnancy She was told to start metformin at low data is available She is only taking 500 mg daily  She was also told to continue this by her PCP despite her A1c and glucose being quite normal She was told that she had a high insulin level of 40 Recently nonfasting glucose in the lab was 124   Past Medical History:  Diagnosis Date  . Anxiety   . Crohn's disease (HCC)   . Depression   . Headache(784.0)   . Ileitis   . Opiate addiction [facility])     Past Surgical History:  Procedure Laterality Date  . COLONOSCOPY    .  HAND SURGERY Left   . I&D EXTREMITY Left 11/06/2016   Procedure: IRRIGATION AND DEBRIDEMENT LEFT HAND;  Surgeon: Bradly Bienenstock, MD;  Location: [facility] OR;  Service: Orthopedics;  Laterality: Left;  . I&D EXTREMITY Left 11/08/2016   Procedure: IRRIGATION AND DEBRIDEMENT LEFT HAND;  Surgeon: Bradly Bienenstock, MD;  Location: [facility] OR;  Service: Orthopedics;  Laterality: Left;  . TONSILLECTOMY      Family History  Problem Relation Age of Onset  . Colon cancer Maternal Grandfather   . Celiac disease Maternal Aunt   . Diabetes Maternal Grandmother   . Hypothyroidism Maternal Grandmother   . Heart disease Unknown   . Diabetes Mother   . Hypertension Mother   . Hypothyroidism Mother   . Hypertension Father   . Heart disease Father   . Hypothyroidism Paternal Grandmother     Social History:  reports that she quit smoking about 15 months ago. Her smoking use included cigarettes. She smoked 0.50 packs per day. She has never used smokeless tobacco. She reports that she drinks alcohol. She reports that she has current or past drug history.  Allergies:  Allergies  Allergen Reactions  . Sulfa Antibiotics Palpitations    Allergies as of 03/19/2018      Reactions   Sulfa Antibiotics Palpitations  Medication List        Accurate as of 03/19/18  8:51 PM. Always use your most recent med list.          levothyroxine 50 MCG tablet Commonly known as:  SYNTHROID, LEVOTHROID Take 1 tablet by mouth daily.   metFORMIN 500 MG 24 hr tablet Commonly known as:  GLUCOPHAGE-XR Take 1 tablet by mouth daily.   methadone 10 MG/ML solution Commonly known as:  DOLOPHINE Take 145 mg by mouth daily.   omeprazole 40 MG capsule Commonly known as:  PRILOSEC Take 1 capsule (40 mg total) by mouth 2 (two) times daily.   TOPAMAX PO Take 1 tablet by mouth. TAKE 1 TABLET BY MOUTH ONCE DAILY IN THE MORNING.   Vitamin D (Ergocalciferol) 1.25 MG (50000 UT) Caps capsule Commonly known as:  DRISDOL Take 1 capsule  by mouth once a week.          Review of Systems  Constitutional: Positive for weight gain.       He thinks she has gained about 50 pounds after her pregnancy last year.  Also did quit smoking.  She thinks she is cautious about her diet and is trying to be active but limited because of her taking care of her 27-year-old child  HENT: Positive for headaches.   Eyes: Negative for blurred vision.  Cardiovascular: Negative for leg swelling.  Gastrointestinal:       She has been treated for reflux and symptoms of epigastric discomfort  Endocrine: Positive for heat intolerance. Negative for menstrual changes.  Musculoskeletal: Negative for joint pain.       She has some sensitivity of her shin bones but no joint pains  Skin: Positive for rash.       She has had a fine rash on her extremities treated with a topical cream  Neurological: Negative for weakness.  Psychiatric/Behavioral: Negative for depressed mood and insomnia.        Wt Readings from Last 3 Encounters:  03/19/18 225 lb 12.8 oz (102.4 kg)  01/04/18 217 lb 2 oz (98.5 kg)  12/31/17 190 lb (86.2 kg)           Examination:    BP 124/80 (BP Location: Left Arm, Patient Position: Sitting, Cuff Size: Normal)   Pulse 64   Ht 5' 3.5" (1.613 m)   Wt 225 lb 12.8 oz (102.4 kg)   SpO2 96%   BMI 39.37 kg/m   GENERAL:  Large build. No cushingoid features with generalized obesity present No buffalo hump or rounded face, no supraclavicular fat pads No thinning of the skin  No pallor.    Skin: Has minimal small papular rash on forearms, brownish-red No acanthosis, hirsutism or alopecia.  EYES:  No prominence of the eyes or swelling  ENT: Oral mucosa and tongue normal.  NECK: No lymphadenopathy  THYROID:  Not palpable.  HEART:  Normal  S1 and S2; no murmur or click.  CHEST:    Lungs: Vescicular breath sounds heard equally.  No crepitations/ wheeze.  ABDOMEN:  No distention.  Liver and spleen not palpable.  No other  mass or tenderness.  NEUROLOGICAL: Reflexes are bilaterally normal at biceps and ankles.  EXTREMITIES:  Normal peripheral joints.  No ankle edema present   Assessment:  HYPOTHYROIDISM, likely subclinical and may have been transient after her delivery last year Currently no information available from previous evaluation by gynecologist and records will be reviewed when available She had no change in her subjective symptoms or  weight with starting levothyroxine Most recent TSH was quite normal at 1.7 Objectively she has no goiter She does however have a family history of hypothyroidism  OBESITY: She has had significant weight gain and etiology unclear, unlikely to be an endocrine cause She does not have any cushingoid features both on history on exam Although she does have some hyperinsulinemia she does not have any signs or symptoms of PCOS also There is a past history of smoking and opioid dependence which she has stopped last year  Abnormal glucose level: She was told she had abnormal blood sugars by her gynecologist last year but no information available Most recent assessment showed normal glucose and A1c below prediabetic range She does not need to be on low-dose metformin with this levels of her blood sugar Reassured her that the high insulin levels she had is related to insulin resistance, family history of diabetes and obesity and is of no significance  PLAN:   Check thyroid levels today to confirm that she is still euthyroid She will stop taking levothyroxine to enable reassessment of her TSH after stopping  She will also stop metformin and follow-up in 2 months with fasting glucose and A1c again  She may benefit from referral to weight loss programs also may benefit from starting Topamax for her migraines   Reather Littler 03/19/2018, 8:51 PM   Consultation note copy sent to the PCP  Note: This office note was prepared with Dragon voice recognition system technology. Any  transcriptional errors that result from this process are unintentional.

## 2018-03-20 LAB — TSH: TSH: 1.77 u[IU]/mL (ref 0.35–4.50)

## 2018-03-20 LAB — T4, FREE: FREE T4: 0.74 ng/dL (ref 0.60–1.60)

## 2018-03-20 NOTE — Progress Notes (Signed)
Please call to let patient know that the thyroid results are normal and to leave off the levothyroxine as discussed

## 2018-03-26 ENCOUNTER — Ambulatory Visit: Payer: Medicaid Other | Admitting: Gastroenterology

## 2018-03-29 ENCOUNTER — Encounter: Payer: Self-pay | Admitting: Oncology

## 2018-03-29 ENCOUNTER — Telehealth: Payer: Self-pay | Admitting: Oncology

## 2018-03-29 ENCOUNTER — Inpatient Hospital Stay: Payer: Medicaid Other

## 2018-03-29 ENCOUNTER — Inpatient Hospital Stay: Payer: Medicaid Other | Attending: Oncology | Admitting: Oncology

## 2018-03-29 VITALS — BP 107/75 | HR 75 | Temp 97.7°F | Resp 17 | Ht 63.5 in | Wt 228.6 lb

## 2018-03-29 DIAGNOSIS — Z79899 Other long term (current) drug therapy: Secondary | ICD-10-CM | POA: Diagnosis not present

## 2018-03-29 DIAGNOSIS — Z87891 Personal history of nicotine dependence: Secondary | ICD-10-CM

## 2018-03-29 DIAGNOSIS — D72829 Elevated white blood cell count, unspecified: Secondary | ICD-10-CM | POA: Insufficient documentation

## 2018-03-29 LAB — CBC WITH DIFFERENTIAL (CANCER CENTER ONLY)
Abs Immature Granulocytes: 0.04 10*3/uL (ref 0.00–0.07)
BASOS ABS: 0 10*3/uL (ref 0.0–0.1)
Basophils Relative: 0 %
EOS ABS: 0.1 10*3/uL (ref 0.0–0.5)
EOS PCT: 1 %
HCT: 40.3 % (ref 36.0–46.0)
Hemoglobin: 12.7 g/dL (ref 12.0–15.0)
Immature Granulocytes: 0 %
Lymphocytes Relative: 24 %
Lymphs Abs: 2.7 10*3/uL (ref 0.7–4.0)
MCH: 25.9 pg — AB (ref 26.0–34.0)
MCHC: 31.5 g/dL (ref 30.0–36.0)
MCV: 82.1 fL (ref 80.0–100.0)
MONO ABS: 0.5 10*3/uL (ref 0.1–1.0)
Monocytes Relative: 5 %
NRBC: 0 % (ref 0.0–0.2)
Neutro Abs: 8.2 10*3/uL — ABNORMAL HIGH (ref 1.7–7.7)
Neutrophils Relative %: 70 %
Platelet Count: 318 10*3/uL (ref 150–400)
RBC: 4.91 MIL/uL (ref 3.87–5.11)
RDW: 13.9 % (ref 11.5–15.5)
WBC Count: 11.6 10*3/uL — ABNORMAL HIGH (ref 4.0–10.5)

## 2018-03-29 NOTE — Telephone Encounter (Signed)
Printed avs. °

## 2018-03-29 NOTE — Progress Notes (Signed)
Reason for the request:    Leukocytosis  HPI: I was asked by Rueben Bash PA-C to evaluate Ms. Lauren Allen for leukocytosis.  She is a 27 year old woman with history of Crohn's disease who was evaluated recently by Dr. Russella Dar for abdominal pain in September 2019.  At that time her evaluation including CT scan of the abdomen and pelvis as well as a chest x-ray we did not show any acute pathology.  She was noted to have a white cell count of 13.4 thousand with normal hemoglobin and platelet count.  She did have a mild neutrophilia but otherwise normal differential.  Her leukocytosis appears to be chronic in nature and has been detected thus far back as 2013.  At that time her white cell count was 14,000 with normal differential.  Her white cell count has fluctuated since that time to normal range and as high as 16,000 in May 2016.  In 2018 her white cell count was as high as 17,000 with neutrophilia and monocytosis mostly.  Clinically, she is asymptomatic from these findings.  She does report epigastric abdominal discomfort at times but no painful adenopathy, constitutional symptoms.  She denies any fevers or changes in her appetite.  She denies any weight loss and of actually have gained weight.  She does not report any headaches, blurry vision, syncope or seizures. Does not report any fevers, chills or sweats.  Does not report any cough, wheezing or hemoptysis.  Does not report any chest pain, palpitation, orthopnea or leg edema.  Does not report any nausea, vomiting or abdominal pain.  Does not report any constipation or diarrhea.  Does not report any skeletal complaints.    Does not report frequency, urgency or hematuria.  Does not report any skin rashes or lesions. Does not report any heat or cold intolerance.  Does not report any lymphadenopathy or petechiae.  Does not report any anxiety or depression.  Remaining review of systems is negative.    Past Medical History:  Diagnosis Date  . Anxiety    . Crohn's disease (HCC)   . Depression   . Headache(784.0)   . Ileitis   . Opiate addiction (HCC)   :  Past Surgical History:  Procedure Laterality Date  . COLONOSCOPY    . HAND SURGERY Left   . I&D EXTREMITY Left 11/06/2016   Procedure: IRRIGATION AND DEBRIDEMENT LEFT HAND;  Surgeon: Bradly Bienenstock, MD;  Location[facility] OR;  Service: Orthopedics;  Laterality: Left;  . I&D EXTREMITY Left 11/08/2016   Procedure: IRRIGATION AND DEBRIDEMENT LEFT HAND;  Surgeon: Bradly Bienenstock, MD;  Location: [facility] OR;  Service: Orthopedics;  Laterality: Left;  . TONSILLECTOMY    :   Current Outpatient Medications:  .  levothyroxine (SYNTHROID, LEVOTHROID) 50 MCG tablet, Take 1 tablet by mouth daily., Disp: , Rfl:  .  metFORMIN (GLUCOPHAGE-XR) 500 MG 24 hr tablet, Take 1 tablet by mouth daily., Disp: , Rfl:  .  methadone (DOLOPHINE) 10 MG/ML solution, Take 145 mg by mouth daily. , Disp: , Rfl:  .  omeprazole (PRILOSEC) 40 MG capsule, Take 1 capsule (40 mg total) by mouth 2 (two) times daily., Disp: 60 capsule, Rfl: 11 .  Topiramate (TOPAMAX PO), Take 1 tablet by mouth. TAKE 1 TABLET BY MOUTH ONCE DAILY IN THE MORNING., Disp: , Rfl:  .  Vitamin D, Ergocalciferol, (DRISDOL) 50000 units CAPS capsule, Take 1 capsule by mouth once a week., Disp: , Rfl: :  Allergies  Allergen Reactions  . Sulfa Antibiotics Palpitations  :  Family History  Problem Relation Age of Onset  . Colon cancer Maternal Grandfather   . Celiac disease Maternal Aunt   . Diabetes Maternal Grandmother   . Hypothyroidism Maternal Grandmother   . Heart disease Unknown   . Diabetes Mother   . Hypertension Mother   . Hypothyroidism Mother   . Hypertension Father   . Heart disease Father   . Hypothyroidism Paternal Grandmother   :  Social History   Socioeconomic History  . Marital status: Married    Spouse name: Not on file  . Number of children: Not on file  . Years of education: Not on file  . Highest education level: Not on file   Occupational History  . Not on file  Social Needs  . Financial resource strain: Not on file  . Food insecurity:    Worry: Not on file    Inability: Not on file  . Transportation needs:    Medical: Not on file    Non-medical: Not on file  Tobacco Use  . Smoking status: Former Smoker    Packs/day: 0.50    Types: Cigarettes    Last attempt to quit: 12/01/2016    Years since quitting: 1.3  . Smokeless tobacco: Never Used  Substance and Sexual Activity  . Alcohol use: Yes    Comment: occasional, not during pregnancy  . Drug use: Not Currently    Comment: Heroine   . Sexual activity: Yes    Birth control/protection: None  Lifestyle  . Physical activity:    Days per week: Not on file    Minutes per session: Not on file  . Stress: Not on file  Relationships  . Social connections:    Talks on phone: Not on file    Gets together: Not on file    Attends religious service: Not on file    Active member of club or organization: Not on file    Attends meetings of clubs or organizations: Not on file    Relationship status: Not on file  . Intimate partner violence:    Fear of current or ex partner: Not on file    Emotionally abused: Not on file    Physically abused: Not on file    Forced sexual activity: Not on file  Other Topics Concern  . Not on file  Social History Narrative   Has one coke a day  :  Pertinent items are noted in HPI. Blood pressure 107/75, pulse 75, temperature 97.7 F (36.5 C), temperature source Oral, resp. rate 17, height 5' 3.5" (1.613 m), weight 228 lb 9.6 oz (103.7 kg), SpO2 99 %, not currently breastfeeding.   Exam: No ECOG 0 General appearance: alert and cooperative appeared without distress. Head: atraumatic without any abnormalities. Eyes: conjunctivae/corneas clear. PERRL.  Sclera anicteric. Throat: lips, mucosa, and tongue normal; without oral thrush or ulcers. Resp: clear to auscultation bilaterally without rhonchi, wheezes or dullness to  percussion. Cardio: regular rate and rhythm, S1, S2 normal, no murmur, click, rub or gallop GI: soft, non-tender; bowel sounds normal; no masses,  no organomegaly Skin: Skin color, texture, turgor normal. No rashes or lesions Lymph nodes: Cervical, supraclavicular, and axillary nodes normal. Neurologic: Grossly normal without any motor, sensory or deep tendon reflexes. Musculoskeletal: No joint deformity or effusion.  CBC    Component Value Date/Time   WBC 13.4 (H) 01/04/2018 1137   RBC 5.09 01/04/2018 1137   HGB 13.4 01/04/2018 1137   HCT 40.4 01/04/2018 1137   PLT  346.0 01/04/2018 1137   MCV 79.5 01/04/2018 1137   MCH 27.1 08/01/2017 0831   MCHC 33.2 01/04/2018 1137   RDW 13.6 01/04/2018 1137   LYMPHSABS 2.3 01/04/2018 1137   MONOABS 0.3 01/04/2018 1137   EOSABS 0.1 01/04/2018 1137   BASOSABS 0.1 01/04/2018 1137     Chemistry      Component Value Date/Time   NA 137 01/04/2018 1137   K 4.3 01/04/2018 1137   CL 99 01/04/2018 1137   CO2 28 01/04/2018 1137   BUN 15 01/04/2018 1137   CREATININE 0.79 01/04/2018 1137   GLU 85 10/24/2017      Component Value Date/Time   CALCIUM 9.9 01/04/2018 1137   ALKPHOS 109 01/04/2018 1137   AST 13 01/04/2018 1137   ALT 16 01/04/2018 1137   BILITOT 0.4 01/04/2018 1137       Assessment and Plan:   27 year old woman with:  1.  Leukocytosis dating back to 2013 and has fluctuated as high as 17,000 and normal range at times.  CBC on January 04, 2018 showed a white cell count of 13,000 with mild neutrophilia but otherwise normal differential.  The differential diagnosis of her leukocytosis was reviewed today.  Reactive findings most likely responsible for these findings.  Given her chronic inflammatory bowel disease this appears to be consistent with these findings.  She continues to smoke which also could cause chronic leukocytosis.   Myeloproliferative disorder is considered unlikely given the chronicity and fluctuation nature of her  counts.  Occult malignancy is considered extremely unlikely given her recent imaging studies and thorough evaluation.  Other myeloproliferative disorder such as chronic myelogenous leukemia, marrow fibrosis will be evaluated for completeness sake obtaining BCR ABL analysis as well as molecular studies for other myeloproliferative disorders.  If these tests are negative, no further intervention is needed.    2.  Follow-up: We will be as needed depending on the results of the blood test.  If her evaluation is negative, no further hematology follow-up is needed.  30  minutes was spent with the patient face-to-face today.  More than 50% of time was dedicated to reviewing laboratory data, the differential diagnosis of these findings and outlining the management plan moving forward.   Thank you for the referral.  A copy of this consult has been forwarded to the requesting physician.

## 2018-04-05 ENCOUNTER — Telehealth: Payer: Self-pay

## 2018-04-05 LAB — BCR ABL1 FISH (GENPATH)

## 2018-04-05 NOTE — Telephone Encounter (Signed)
-----   Message from Benjiman Core, MD sent at 04/05/2018  2:33 PM EST ----- Please let her know all her labs are normal.

## 2018-04-05 NOTE — Telephone Encounter (Signed)
Contacted patient and left message with results and to call back with any questions or concerns. 

## 2018-04-11 LAB — JAK2 (INCLUDING V617F AND EXON 12), MPL,& CALR-NEXT GEN SEQ

## 2018-05-02 ENCOUNTER — Ambulatory Visit: Payer: Medicaid Other | Admitting: Gastroenterology

## 2018-05-20 ENCOUNTER — Other Ambulatory Visit: Payer: Medicaid Other

## 2018-05-23 ENCOUNTER — Ambulatory Visit: Payer: Medicaid Other | Admitting: Endocrinology

## 2018-12-09 IMAGING — CR DG CHEST 2V
2 series · 2 of 2 positions shown · non-contrast
Comparison: Chest x-ray of June 06, 2007

CLINICAL DATA: Cough, chest congestion, and low-grade fever
associated with malaise for the past 3 days. History of substance
abuse, sepsis, former tobacco smoker.

EXAM:
CHEST - 2 VIEW

[w chest pa]
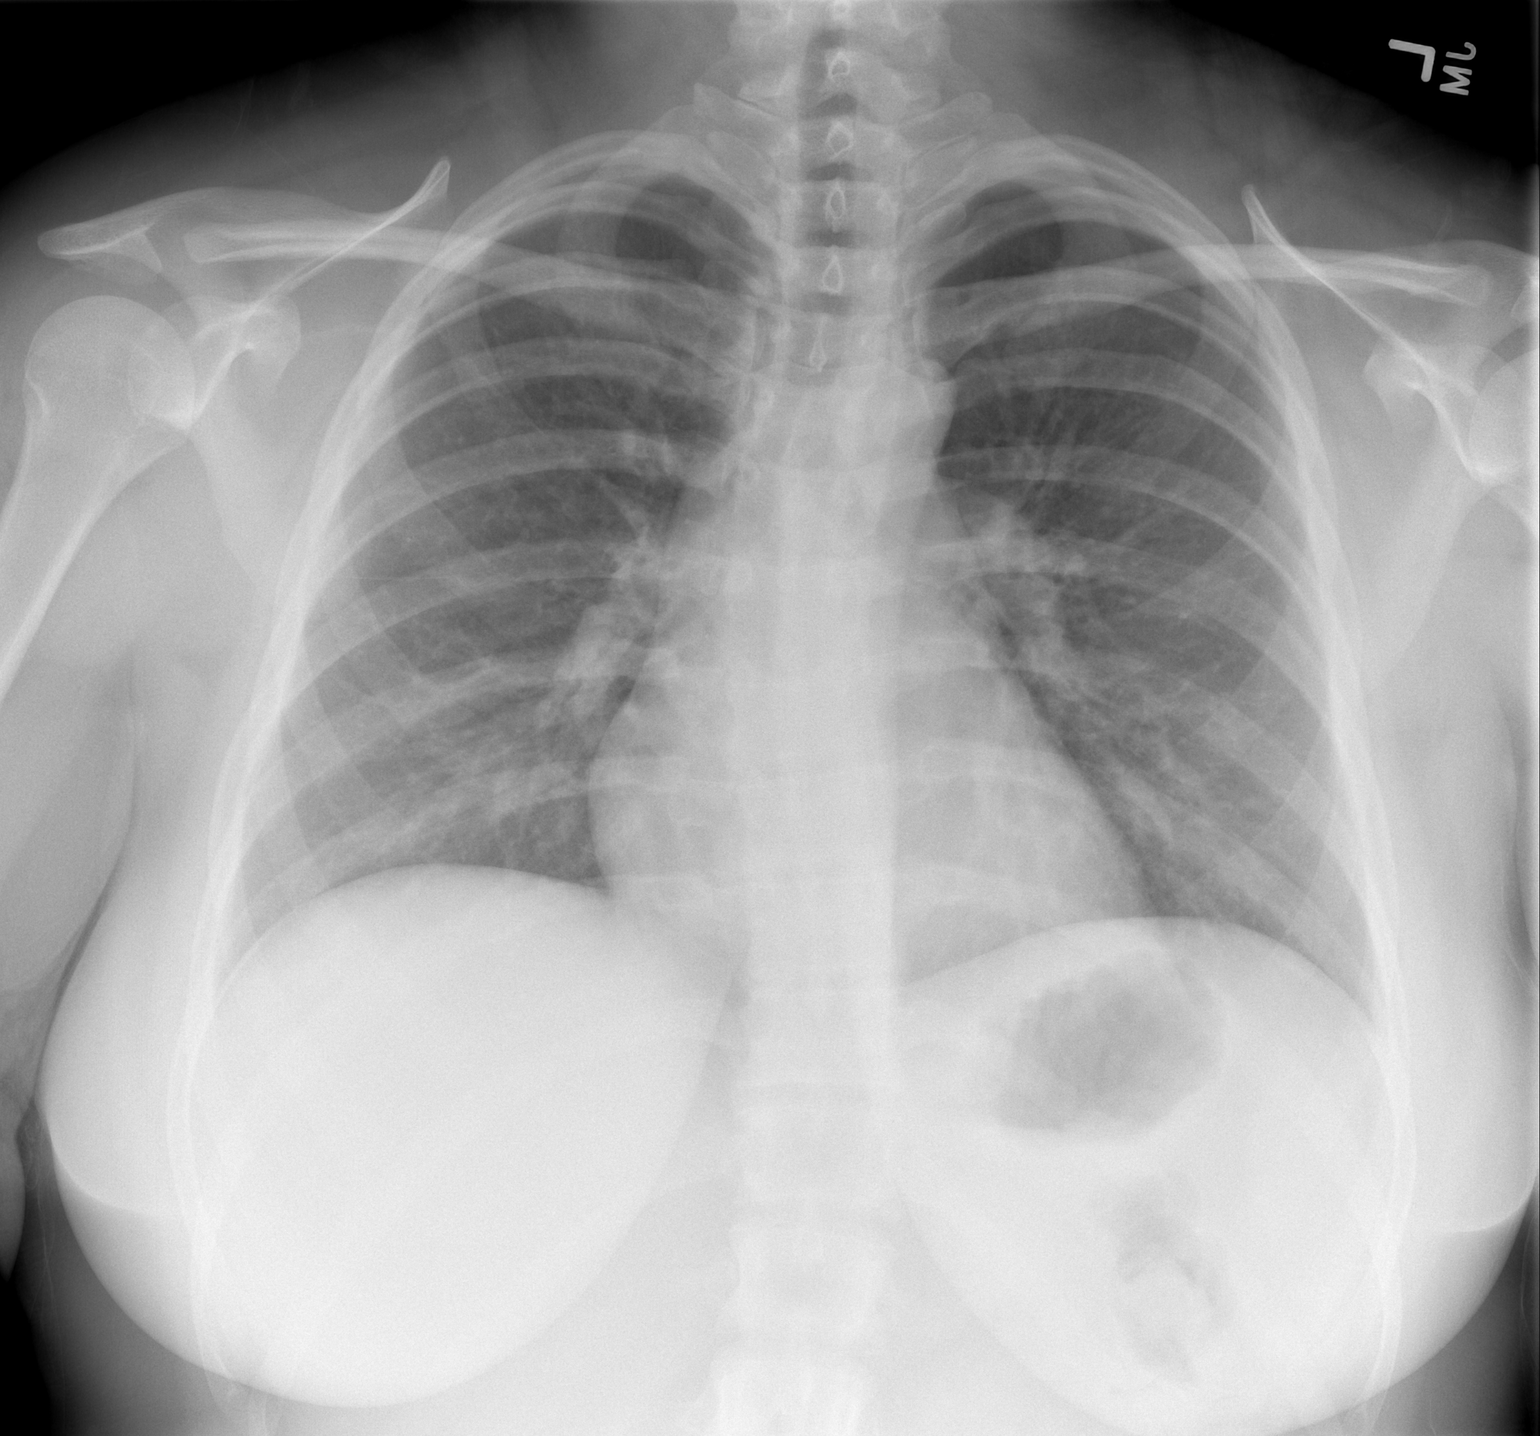

[w chest lat]
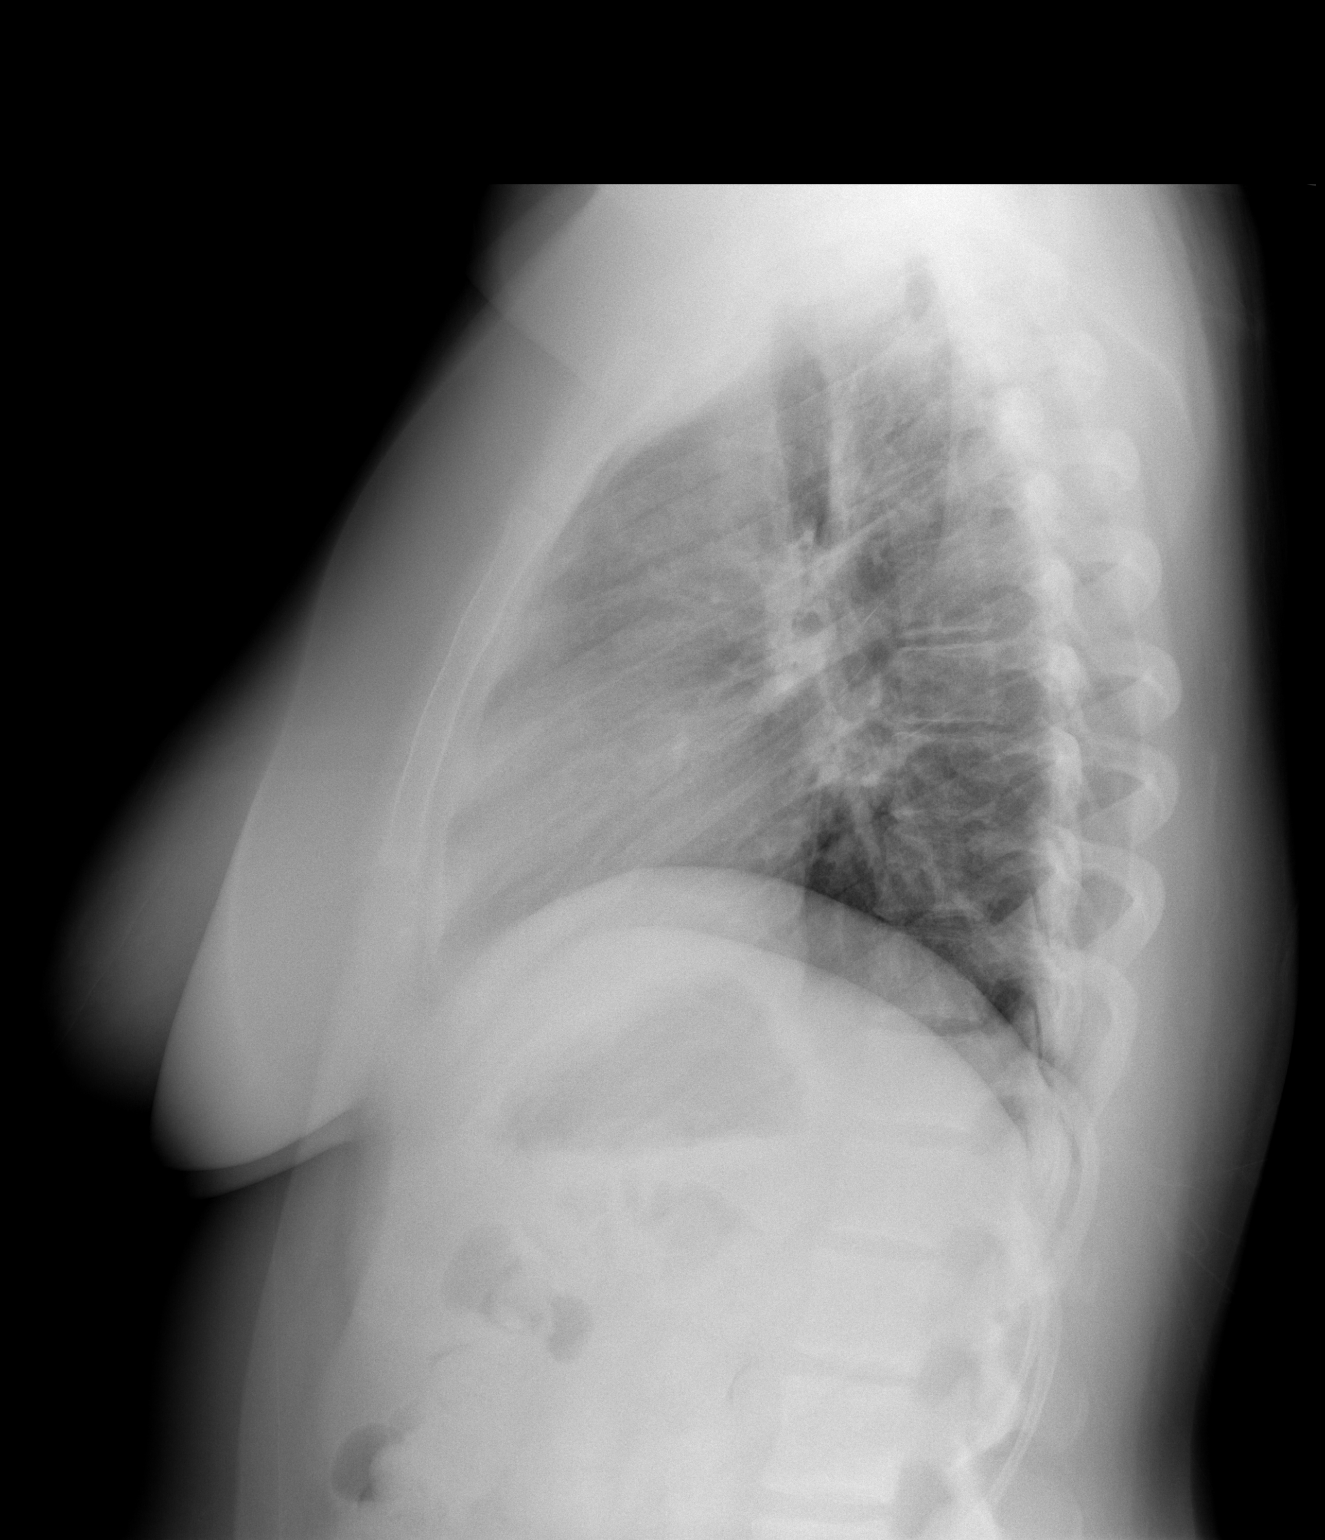

[2 of 2 positions shown; findings below may reference images not displayed]

FINDINGS: The lungs are adequately inflated. There is no focal infiltrate.
There is no pleural effusion. The heart and pulmonary vascularity
are normal. The mediastinum is normal in width. There is no pleural
effusion. There is stable mild dextrocurvature centered at T7.
IMPRESSION: There is no active cardiopulmonary disease.

## 2018-12-17 IMAGING — US US ABDOMEN COMPLETE
1 series · 14 of 25 positions shown · non-contrast
Comparison: February 14, 2005

CLINICAL DATA: Abdominal pain

EXAM:
ABDOMEN ULTRASOUND COMPLETE

[Series 1: us abdomen complete · 14 of 67 slices shown]
[im 1/67]
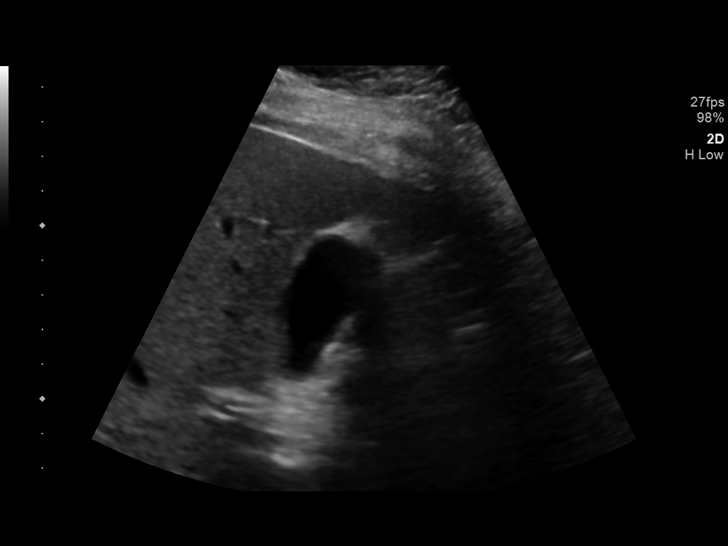
[im 6/67]
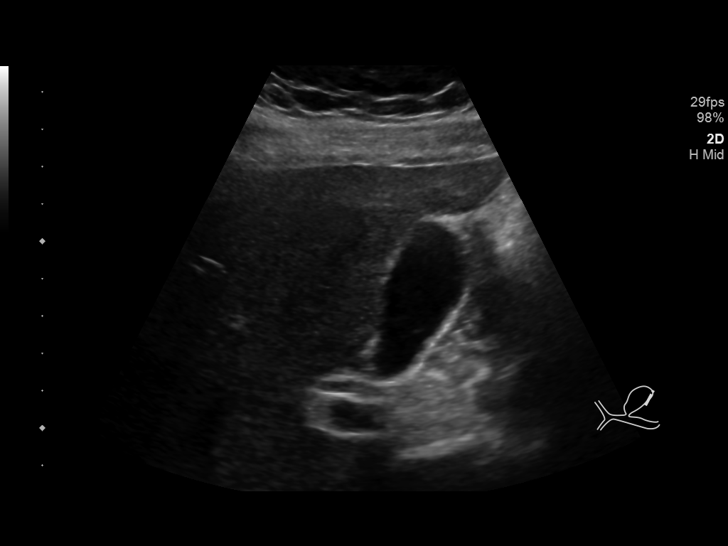
[im 12/67]
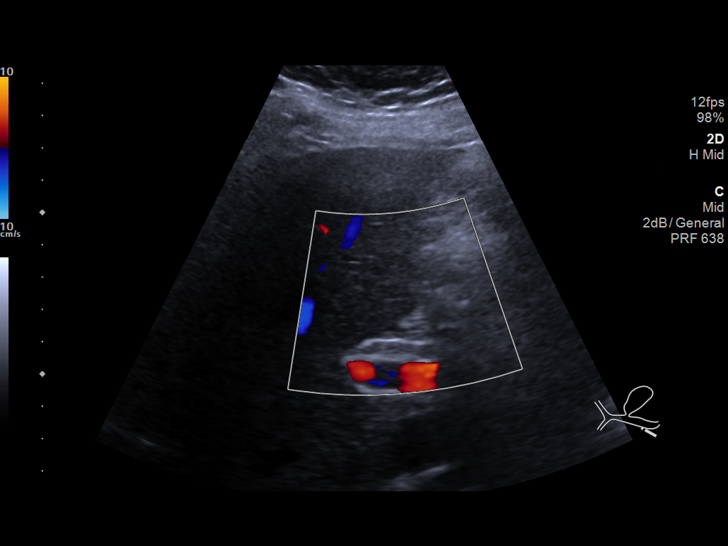
[im 17/67]
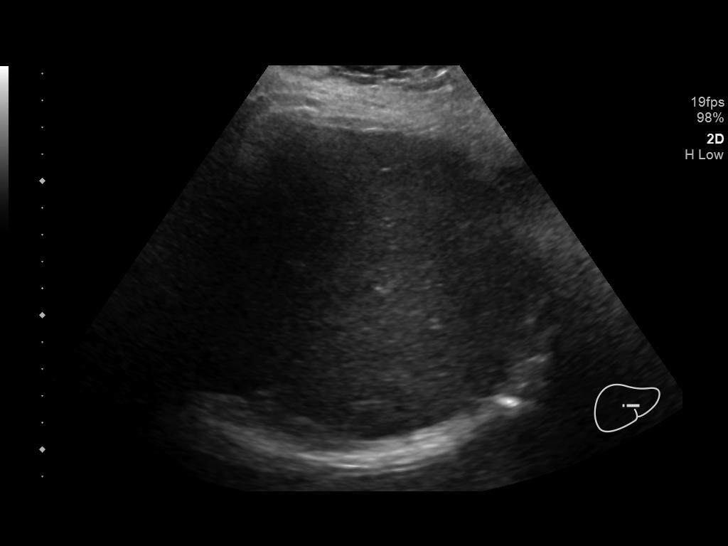
[im 23/67]
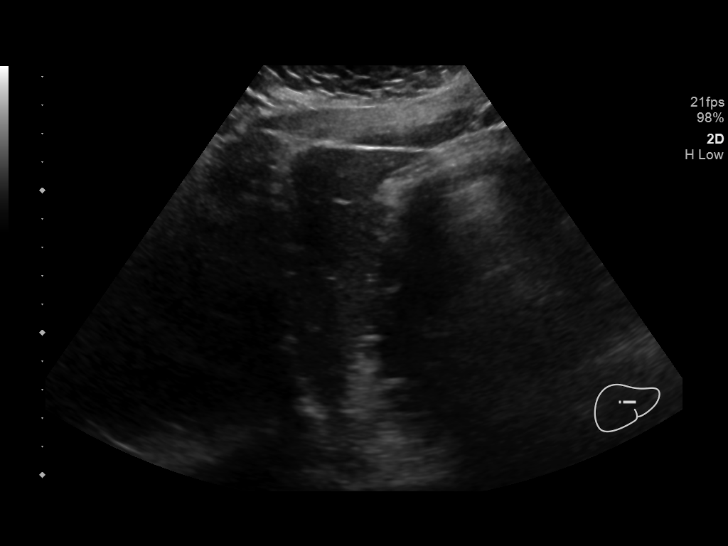
[im 25/67]
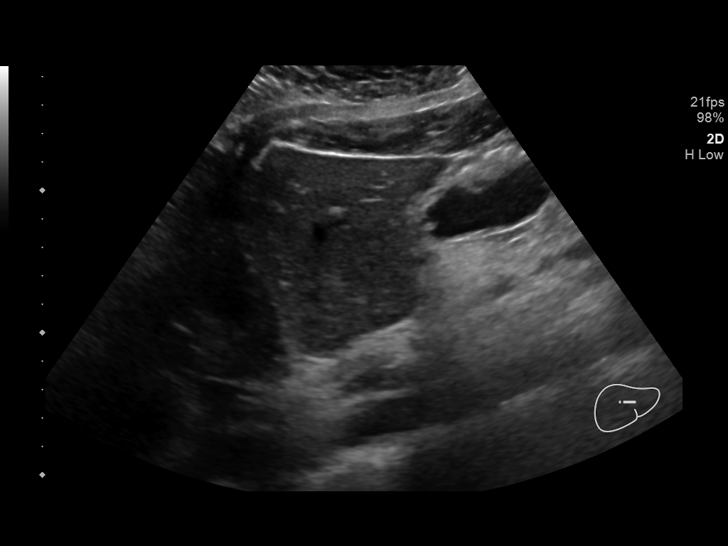
[im 31/67]
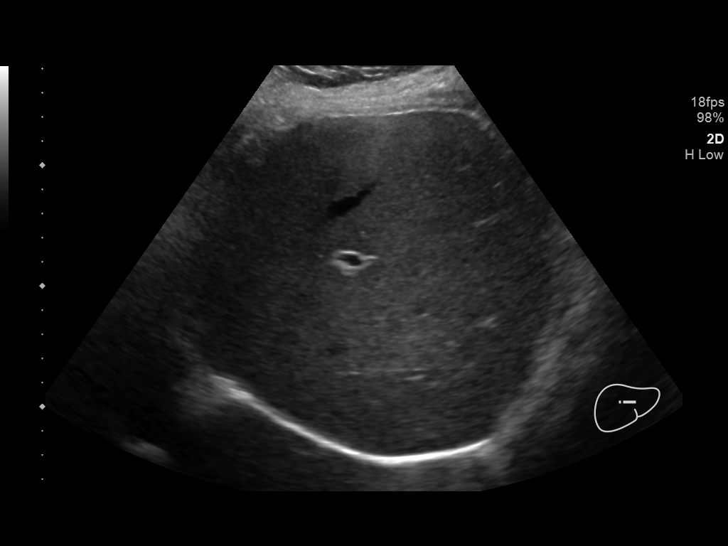
[im 36/67]
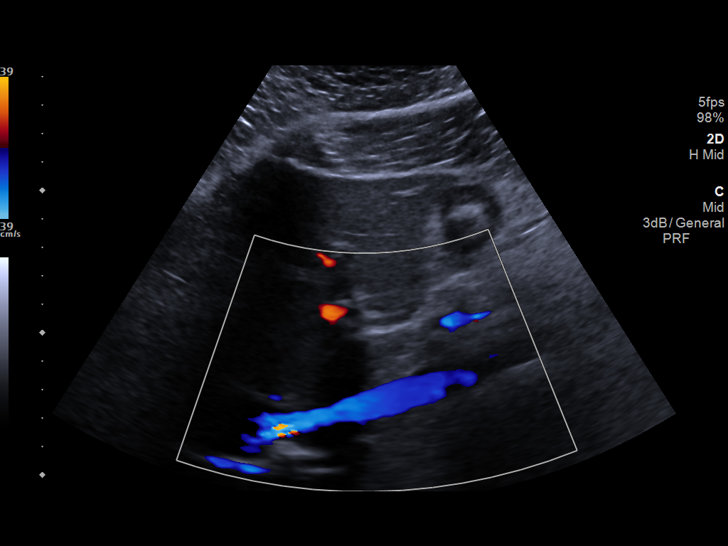
[im 42/67]
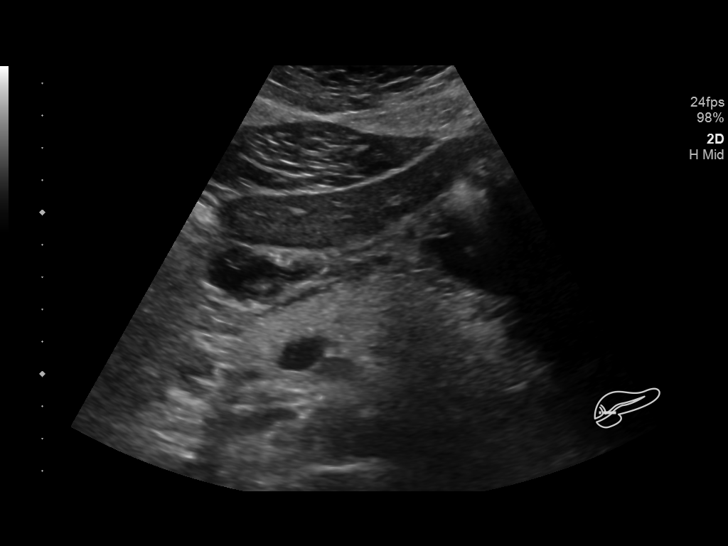
[im 45/67]
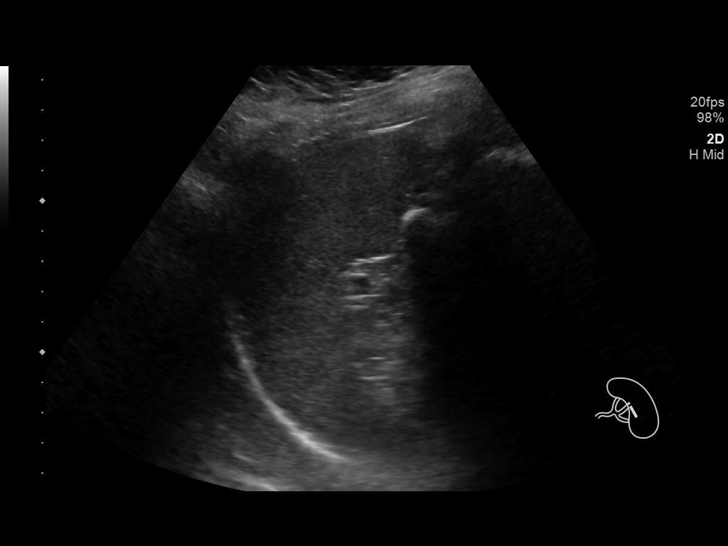
[im 50/67]
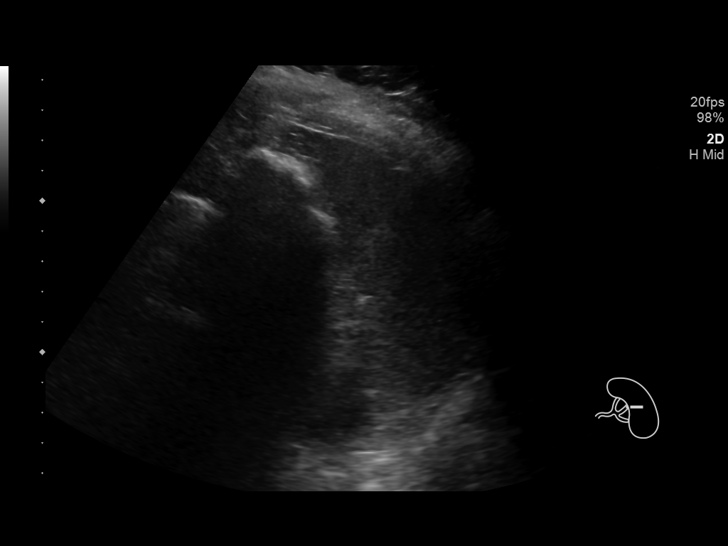
[im 56/67]
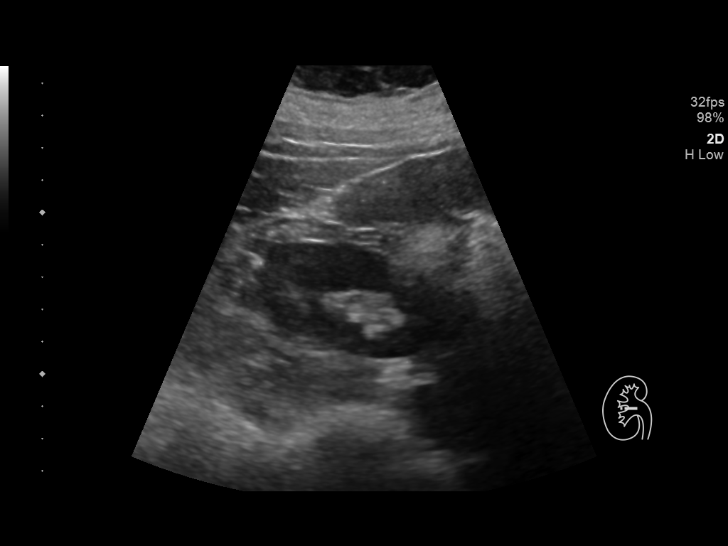
[im 61/67]
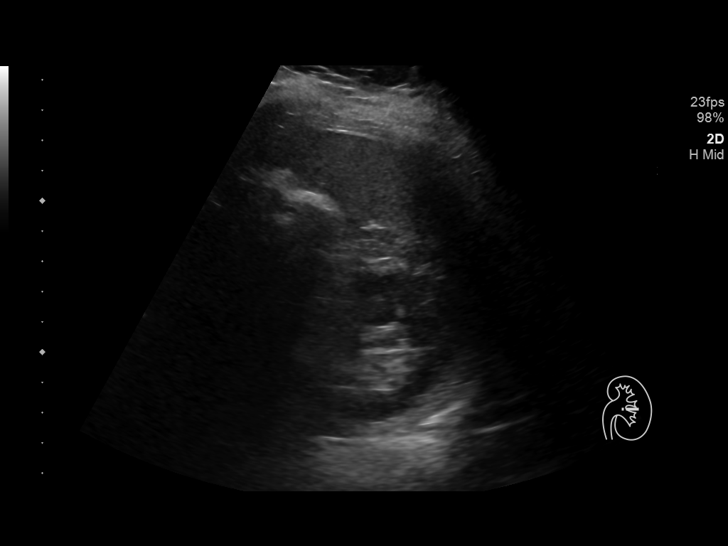
[im 67/67]
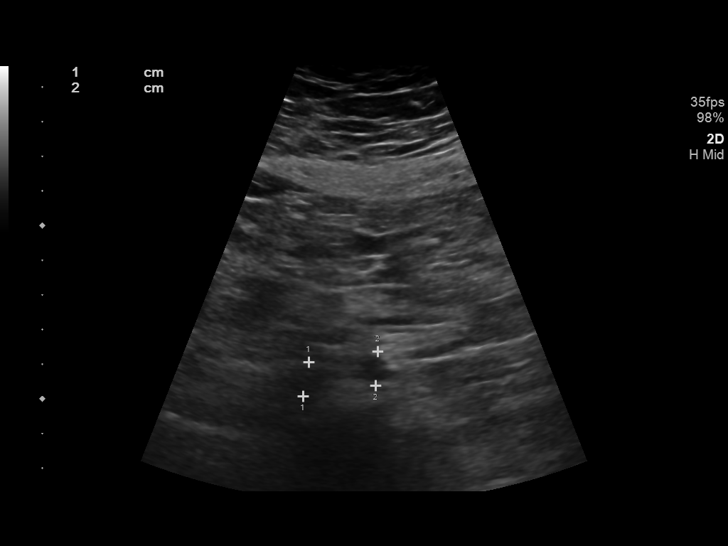

[14 of 25 positions shown; findings below may reference images not displayed]

FINDINGS: Gallbladder: No gallstones or wall thickening visualized. There is
no pericholecystic fluid. No sonographic Murphy sign noted by
sonographer.

Common bile duct: Diameter: 4 mm. No intrahepatic, common hepatic,
common bile dilatation.

Liver: No focal lesion identified. Within normal limits in
parenchymal echogenicity. Portal vein is patent on color Doppler
imaging with normal direction of blood flow towards the liver.

IVC: No abnormality visualized.

Pancreas: No pancreatic mass or inflammatory focus.

Spleen: Size and appearance within normal limits.

Right Kidney: Length: 10.4 cm. Echogenicity within normal limits. No
mass or hydronephrosis visualized.

Left Kidney: Length: 10.8 cm. Echogenicity within normal limits. No
mass or hydronephrosis visualized.

Abdominal aorta: No aneurysm visualized.

Other findings: No demonstrable ascites.
IMPRESSION: Study within normal limits.

## 2021-05-31 ENCOUNTER — Encounter (HOSPITAL_BASED_OUTPATIENT_CLINIC_OR_DEPARTMENT_OTHER): Payer: Self-pay

## 2021-05-31 ENCOUNTER — Other Ambulatory Visit: Payer: Self-pay

## 2021-05-31 ENCOUNTER — Emergency Department (HOSPITAL_BASED_OUTPATIENT_CLINIC_OR_DEPARTMENT_OTHER)
Admission: EM | Admit: 2021-05-31 | Discharge: 2021-05-31 | Disposition: A | Discharge status: Home / Self Care | Payer: Medicaid Other | Attending: Emergency Medicine | Admitting: Emergency Medicine

## 2021-05-31 ENCOUNTER — Other Ambulatory Visit (HOSPITAL_BASED_OUTPATIENT_CLINIC_OR_DEPARTMENT_OTHER): Payer: Self-pay

## 2021-05-31 DIAGNOSIS — Z79899 Other long term (current) drug therapy: Secondary | ICD-10-CM | POA: Insufficient documentation

## 2021-05-31 DIAGNOSIS — N3001 Acute cystitis with hematuria: Secondary | ICD-10-CM | POA: Insufficient documentation

## 2021-05-31 LAB — URINALYSIS, ROUTINE W REFLEX MICROSCOPIC
Bilirubin Urine: NEGATIVE
Glucose, UA: NEGATIVE mg/dL
Ketones, ur: NEGATIVE mg/dL
Nitrite: NEGATIVE
Protein, ur: 30 mg/dL — AB
RBC / HPF: >50 RBC/hpf — ABNORMAL HIGH (ref 0–5)
Specific Gravity, Urine: 1.026 (ref 1.005–1.030)
Squamous Epithelial / HPF: >50 — ABNORMAL HIGH (ref 0–5)
pH: 6 (ref 5.0–8.0)

## 2021-05-31 LAB — PREGNANCY, URINE: Preg Test, Ur: NEGATIVE

## 2021-05-31 MED ORDER — NITROFURANTOIN MONOHYD MACRO 100 MG PO CAPS
100.0000 mg | ORAL_CAPSULE | Freq: Two times a day (BID) | ORAL | 0 refills | Status: DC
  Filled 2021-05-31: qty 10, 5d supply, fill #0

## 2021-05-31 NOTE — ED Triage Notes (Signed)
Present for urinary frequency.  Feeling like to go non stop.  Onset 10 days worse in last three days.

## 2021-05-31 NOTE — Discharge Instructions (Signed)
If you develop fever, vomiting, abdominal pain, flank pain, or any other new/concerning symptoms then return to the ER for evaluation.

## 2021-05-31 NOTE — ED Provider Notes (Signed)
[facility] DEPT Provider Note   CSN: [id number] Arrival date & time: 05/31/21  [id number]     History  Chief Complaint  Patient presents with   Urinary Frequency    Lauren Allen is a 31 y.o. female.  HPI 31 year old female presents with urinary frequency.  She states she thinks has been going on for about 7 days or so but worse over the last 3 days.  Feels like she constantly has to urinate.  Each time she urinates she is urinating less than typical.  No fevers, flank or back pain, or abdominal pain.  There is no dysuria.  No vaginal symptoms.  She has a remote history of a prior UTI but otherwise no similar symptoms recently.  She states she took some over-the-counter medicine but does not know the name but thinks it was generic for Azo.  She then took another over-the-counter medicine pharmacy recommended.  Neither has helped though her urine change color.  Home Medications Prior to Admission medications   Medication Sig Start Date End Date Taking? Authorizing Provider  nitrofurantoin, macrocrystal-monohydrate, (MACROBID) 100 MG capsule Take 1 capsule (100 mg total) by mouth 2 (two) times daily. 05/31/21  Yes Sherwood Gambler, MD  methadone (DOLOPHINE) 10 MG/ML solution Take 145 mg by mouth daily.     [provider]  omeprazole (PRILOSEC) 40 MG capsule Take 1 capsule (40 mg total) by mouth 2 (two) times daily. 01/04/18   Ladene Artist, MD  Topiramate (TOPAMAX PO) Take 1 tablet by mouth. TAKE 1 TABLET BY MOUTH ONCE DAILY IN THE MORNING.    [provider]  Vitamin D, Ergocalciferol, (DRISDOL) 50000 units CAPS capsule Take 1 capsule by mouth once a week. 10/23/17   [provider]      Allergies    Sulfa antibiotics    Review of Systems   Review of Systems  Constitutional:  Negative for fever.  Gastrointestinal:  Negative for abdominal pain and vomiting.  Endocrine: Negative for polydipsia.  Genitourinary:  Positive for frequency  and urgency. Negative for dysuria and flank pain.  Musculoskeletal:  Negative for back pain.   Physical Exam Updated Vital Signs BP 108/80    Pulse 92    Temp 98.7 F (37.1 C) (Oral)    Resp 18    Ht 5\' 3"  (1.6 m)    LMP 05/30/2021 (Exact Date)    SpO2 97%    BMI 40.49 kg/m  Physical Exam Vitals and nursing note reviewed.  Constitutional:      General: She is not in acute distress.    Appearance: She is well-developed. She is obese. She is not ill-appearing or diaphoretic.  HENT:     Head: Normocephalic and atraumatic.  Cardiovascular:     Rate and Rhythm: Normal rate and regular rhythm.     Heart sounds: Normal heart sounds.  Pulmonary:     Effort: Pulmonary effort is normal.     Breath sounds: Normal breath sounds.  Abdominal:     Palpations: Abdomen is soft.     Tenderness: There is no abdominal tenderness. There is no right CVA tenderness or left CVA tenderness.  Skin:    General: Skin is warm and dry.  Neurological:     Mental Status: She is alert.    ED Results / Procedures / Treatments   Labs (all labs ordered are listed, but only abnormal results are displayed) Labs Reviewed  URINALYSIS, ROUTINE W REFLEX MICROSCOPIC - Abnormal; Notable for the following components:  Result Value   APPearance HAZY (*)    Hgb urine dipstick LARGE (*)    Protein, ur 30 (*)    Leukocytes,Ua LARGE (*)    RBC / HPF >50 (*)    Bacteria, UA MANY (*)    Squamous Epithelial / LPF >50 (*)    All other components within normal limits  PREGNANCY, URINE    EKG None  Radiology No results found.  Procedures Procedures    Medications Ordered in ED Medications - No data to display  ED Course/ Medical Decision Making/ A&P                           Medical Decision Making Amount and/or Complexity of Data Reviewed Labs: ordered.  Risk Prescription drug management.   Patient's presentation and urinalysis are consistent with an acute urinary tract infection.  Pregnancy test  is negative.  Her urinalysis is a little contaminated with squamous epithelials present, but based on symptoms I am suspecting UTI and thus I think would be reasonable to treat with Macrobid prescription and see if her symptoms improve.  She otherwise currently has normal vitals.  No concern for pyelonephritis/a sending disease.  Will discharge home with return precautions.        Final Clinical Impression(s) / ED Diagnoses Final diagnoses:  Acute cystitis with hematuria    Rx / DC Orders ED Discharge Orders          Ordered    nitrofurantoin, macrocrystal-monohydrate, (MACROBID) 100 MG capsule  2 times daily        05/31/21 [id number]              Sherwood Gambler, MD 05/31/21 [id number]

## 2021-05-31 NOTE — ED Notes (Signed)
Reviewed discharge instructions.  Gave verbal indications understood such.

## 2021-06-01 ENCOUNTER — Other Ambulatory Visit (HOSPITAL_BASED_OUTPATIENT_CLINIC_OR_DEPARTMENT_OTHER): Payer: Self-pay

## 2022-08-28 ENCOUNTER — Encounter (HOSPITAL_BASED_OUTPATIENT_CLINIC_OR_DEPARTMENT_OTHER): Payer: Self-pay

## 2022-08-28 ENCOUNTER — Other Ambulatory Visit (HOSPITAL_BASED_OUTPATIENT_CLINIC_OR_DEPARTMENT_OTHER): Payer: Self-pay

## 2022-08-28 ENCOUNTER — Other Ambulatory Visit: Payer: Self-pay

## 2022-08-28 ENCOUNTER — Emergency Department (HOSPITAL_BASED_OUTPATIENT_CLINIC_OR_DEPARTMENT_OTHER)
Admission: EM | Admit: 2022-08-28 | Discharge: 2022-08-28 | Disposition: A | Discharge status: Home / Self Care | Payer: Self-pay | Attending: Emergency Medicine | Admitting: Emergency Medicine

## 2022-08-28 ENCOUNTER — Emergency Department (HOSPITAL_BASED_OUTPATIENT_CLINIC_OR_DEPARTMENT_OTHER): Payer: Self-pay | Admitting: Radiology

## 2022-08-28 DIAGNOSIS — K5900 Constipation, unspecified: Secondary | ICD-10-CM | POA: Insufficient documentation

## 2022-08-28 LAB — PREGNANCY, URINE: Preg Test, Ur: NEGATIVE

## 2022-08-28 MED ORDER — BISACODYL 10 MG RE SUPP: 10.0000 mg | RECTAL | 0 refills | Status: DC | PRN

## 2022-08-28 MED ORDER — GOLYTELY 236 G PO SOLR: ORAL | 0 refills | Status: DC

## 2022-08-28 MED ORDER — BISACODYL 10 MG RE SUPP
10.0000 mg | RECTAL | 0 refills | Status: DC | PRN
  Filled 2022-08-28: qty 8, 8d supply, fill #0

## 2022-08-28 MED ORDER — PEG 3350-KCL-NA BICARB-NACL 420 G PO SOLR
ORAL | 0 refills | Status: DC
  Filled 2022-08-28: qty 4000, 1d supply, fill #0

## 2022-08-28 NOTE — ED Triage Notes (Signed)
Pt reports she's been "severely constipated" x10 days. States she's tried multiple OTC remedies, pre/probiotic gummies but has missed "like 5 doses." Reports she's able to "go, but very little." Reports she's still able to pass gas.   Reports hx of crohns, on methadone. Took 2 doses of dulcolax yesterday/ this AM, no relief.

## 2022-08-28 NOTE — ED Provider Notes (Signed)
[place] EMERGENCY DEPARTMENT AT [facility] Provider Note   CSN: [id number] Arrival date & time: 08/28/22  1435     History  Chief Complaint  Patient presents with   Constipation    Lauren Allen is a 32 y.o. female.   Constipation    Patient has a history of depression ileitis, Crohn's disease, chronic opiate use who presents to the ED with complaints of constipation.  Patient states she has not been able to have a normal bowel movement for 10 days.  She has tried probiotic Gummies and few other over-the-counter medications such as Dulcolax.  Patient states she is passing gas but has not had a normal bowel movement.  She has not had any nausea or vomiting.  She has had some cramping but no significant abdominal pain.  No fevers or chills.  Home Medications Prior to Admission medications   Medication Sig Start Date End Date Taking? Authorizing Provider  bisacodyl (DULCOLAX) 10 MG suppository Place 1 suppository (10 mg total) rectally as needed for moderate constipation. 08/28/22  Yes Linwood Dibbles, MD  polyethylene glycol (GOLYTELY) 236 g solution Take 100 ml every 1-2 hours until constipation is relieved 08/28/22  Yes Linwood Dibbles, MD  methadone (DOLOPHINE) 10 MG/ML solution Take 145 mg by mouth daily.     [provider]  nitrofurantoin, macrocrystal-monohydrate, (MACROBID) 100 MG capsule Take 1 capsule (100 mg total) by mouth 2 (two) times daily. 05/31/21   Pricilla Loveless, MD  omeprazole (PRILOSEC) 40 MG capsule Take 1 capsule (40 mg total) by mouth 2 (two) times daily. 01/04/18   Meryl Dare, MD  Topiramate (TOPAMAX PO) Take 1 tablet by mouth. TAKE 1 TABLET BY MOUTH ONCE DAILY IN THE MORNING.    [provider]  Vitamin D, Ergocalciferol, (DRISDOL) 50000 units CAPS capsule Take 1 capsule by mouth once a week. 10/23/17   [provider]      Allergies    Sulfa antibiotics    Review of Systems   Review of Systems  Gastrointestinal:  Positive  for constipation.    Physical Exam Updated Vital Signs BP (!) 153/82 (BP Location: Right Arm)   Pulse (!) 101   Temp 98.1 F (36.7 C)   Resp (!) 22   Ht 1.6 m (5\' 3" )   Wt 103.7 kg   SpO2 96%   BMI 40.50 kg/m  Physical Exam Vitals and nursing note reviewed.  Constitutional:      General: She is not in acute distress.    Appearance: She is well-developed.  HENT:     Head: Normocephalic and atraumatic.     Right Ear: External ear normal.     Left Ear: External ear normal.  Eyes:     General: No scleral icterus.       Right eye: No discharge.        Left eye: No discharge.     Conjunctiva/sclera: Conjunctivae normal.  Neck:     Trachea: No tracheal deviation.  Cardiovascular:     Rate and Rhythm: Normal rate and regular rhythm.  Pulmonary:     Effort: Pulmonary effort is normal. No respiratory distress.     Breath sounds: Normal breath sounds. No stridor. No wheezing or rales.  Abdominal:     General: Bowel sounds are normal. There is no distension.     Palpations: Abdomen is soft.     Tenderness: There is no abdominal tenderness. There is no guarding or rebound.  Genitourinary:    Comments:  Normal rectal tone, no fecal impaction Musculoskeletal:        General: No tenderness or deformity.     Cervical back: Neck supple.  Skin:    General: Skin is warm and dry.     Findings: No rash.  Neurological:     General: No focal deficit present.     Mental Status: She is alert.     Cranial Nerves: No cranial nerve deficit, dysarthria or facial asymmetry.     Sensory: No sensory deficit.     Motor: No abnormal muscle tone or seizure activity.     Coordination: Coordination normal.  Psychiatric:        Mood and Affect: Mood normal.     ED Results / Procedures / Treatments   Labs (all labs ordered are listed, but only abnormal results are displayed) Labs Reviewed  PREGNANCY, URINE    EKG None  Radiology DG Abdomen Acute W/Chest  Result Date:  08/28/2022 CLINICAL DATA:  Decreased bowel movement EXAM: DG ABDOMEN ACUTE WITH 1 VIEW CHEST COMPARISON:  CT AP 01/30/18 FINDINGS: No pleural effusion. No pneumothorax. Normal cardiac and mediastinal contours. No focal airspace opacity. No radiographically apparent displaced rib fractures. Assessment for small-bowel obstruction is limited due to the paucity of small bowel gas. Within this limitation, no dilated loops of bowel are visualized to suggest obstruction. Mild colonic stool burden. No acute osseous abnormality. IMPRESSION: 1. No acute cardiopulmonary disease. 2. Mild colonic stool burden. Electronically Signed   By: Lorenza Cambridge M.D.   On: 08/28/2022 15:50    Procedures Procedures    Medications Ordered in ED Medications - No data to display  ED Course/ Medical Decision Making/ A&P Clinical Course as of 08/28/22 1622  Mon Aug 28, 2022  1603 Xrays without signs of obstruction [JK]  1621 Pregnancy test negative [JK]    Clinical Course User Index [JK] Linwood Dibbles, MD                             Medical Decision Making Problems Addressed: Constipation, unspecified constipation type: acute illness or injury that poses a threat to life or bodily functions  Amount and/or Complexity of Data Reviewed Labs: ordered. Decision-making details documented in ED Course. Radiology: ordered and independent interpretation performed.  Risk OTC drugs. Prescription drug management.   Patient presented to the ED for evaluation of constipation.  Patient does have history of chronic methadone use.  She is not having trouble with vomiting.  She denies any abdominal pain.  Her abdomen is benign.  I doubt acute infection.  X-rays do not show any findings to suggest obstruction.  No fecal impaction noted on exam.  Discussed treatment options with patient.  Will discharge home with GoLytely.  Stressed importance of daily stool softeners once this episode resolves to help prevent recurrent episodes of  constipation        Final Clinical Impression(s) / ED Diagnoses Final diagnoses:  Constipation, unspecified constipation type    Rx / DC Orders ED Discharge Orders          Ordered    polyethylene glycol (GOLYTELY) 236 g solution        08/28/22 1621    bisacodyl (DULCOLAX) 10 MG suppository  As needed        08/28/22 1621              Linwood Dibbles, MD 08/28/22 1622

## 2022-08-28 NOTE — Discharge Instructions (Addendum)
The GoLytely should help with your constipation.  It is often used before colonoscopies.  The Dulcolax can also be used to help relieve constipation.  Consider following up with a primary care or GI doctor once symptoms have resolved.  Consider taking a daily stool softener to help prevent constipation after this episode is resolved.

## 2022-08-28 NOTE — ED Notes (Signed)
RN provided AVS using Teachback Method. Patient verbalizes understanding of Discharge Instructions. Opportunity for Questioning and Answers were provided by RN. Patient Discharged from ED ambulatory to Home. ° °

## 2022-08-28 NOTE — ED Notes (Signed)
Transported to Radiology

## 2022-10-10 ENCOUNTER — Inpatient Hospital Stay (HOSPITAL_BASED_OUTPATIENT_CLINIC_OR_DEPARTMENT_OTHER)
Admission: EM | Admit: 2022-10-10 | Discharge: 2022-10-16 | DRG: 574 | Disposition: A | Discharge status: Home / Self Care | Payer: Self-pay | Attending: Orthopedic Surgery | Admitting: Orthopedic Surgery

## 2022-10-10 ENCOUNTER — Emergency Department (HOSPITAL_BASED_OUTPATIENT_CLINIC_OR_DEPARTMENT_OTHER): Payer: Self-pay | Admitting: Radiology

## 2022-10-10 ENCOUNTER — Encounter (HOSPITAL_BASED_OUTPATIENT_CLINIC_OR_DEPARTMENT_OTHER): Payer: Self-pay

## 2022-10-10 ENCOUNTER — Other Ambulatory Visit: Payer: Self-pay

## 2022-10-10 DIAGNOSIS — K5090 Crohn's disease, unspecified, without complications: Secondary | ICD-10-CM | POA: Diagnosis present

## 2022-10-10 DIAGNOSIS — Z800 Family history of malignant neoplasm of digestive organs: Secondary | ICD-10-CM

## 2022-10-10 DIAGNOSIS — S41102A Unspecified open wound of left upper arm, initial encounter: Principal | ICD-10-CM

## 2022-10-10 DIAGNOSIS — B952 Enterococcus as the cause of diseases classified elsewhere: Secondary | ICD-10-CM | POA: Diagnosis present

## 2022-10-10 DIAGNOSIS — F1120 Opioid dependence, uncomplicated: Secondary | ICD-10-CM | POA: Diagnosis present

## 2022-10-10 DIAGNOSIS — F419 Anxiety disorder, unspecified: Secondary | ICD-10-CM | POA: Diagnosis present

## 2022-10-10 DIAGNOSIS — Z833 Family history of diabetes mellitus: Secondary | ICD-10-CM

## 2022-10-10 DIAGNOSIS — Z8249 Family history of ischemic heart disease and other diseases of the circulatory system: Secondary | ICD-10-CM

## 2022-10-10 DIAGNOSIS — T148XXA Other injury of unspecified body region, initial encounter: Secondary | ICD-10-CM

## 2022-10-10 DIAGNOSIS — Z8349 Family history of other endocrine, nutritional and metabolic diseases: Secondary | ICD-10-CM

## 2022-10-10 DIAGNOSIS — L089 Local infection of the skin and subcutaneous tissue, unspecified: Secondary | ICD-10-CM | POA: Diagnosis present

## 2022-10-10 DIAGNOSIS — Z597 Insufficient social insurance and welfare support: Secondary | ICD-10-CM

## 2022-10-10 DIAGNOSIS — F32A Depression, unspecified: Secondary | ICD-10-CM | POA: Diagnosis present

## 2022-10-10 DIAGNOSIS — I96 Gangrene, not elsewhere classified: Secondary | ICD-10-CM | POA: Diagnosis present

## 2022-10-10 DIAGNOSIS — L02414 Cutaneous abscess of left upper limb: Principal | ICD-10-CM | POA: Diagnosis present

## 2022-10-10 DIAGNOSIS — F17210 Nicotine dependence, cigarettes, uncomplicated: Secondary | ICD-10-CM | POA: Diagnosis present

## 2022-10-10 DIAGNOSIS — S41109A Unspecified open wound of unspecified upper arm, initial encounter: Secondary | ICD-10-CM

## 2022-10-10 DIAGNOSIS — Z882 Allergy status to sulfonamides status: Secondary | ICD-10-CM

## 2022-10-10 DIAGNOSIS — Z8379 Family history of other diseases of the digestive system: Secondary | ICD-10-CM

## 2022-10-10 DIAGNOSIS — B9562 Methicillin resistant Staphylococcus aureus infection as the cause of diseases classified elsewhere: Secondary | ICD-10-CM | POA: Diagnosis present

## 2022-10-10 LAB — COMPREHENSIVE METABOLIC PANEL
ALT: 25 U/L (ref 0–44)
AST: 21 U/L (ref 15–41)
Albumin: 3.8 g/dL (ref 3.5–5.0)
Alkaline Phosphatase: 122 U/L (ref 38–126)
Anion gap: 7 (ref 5–15)
BUN: 12 mg/dL (ref 6–20)
CO2: 30 mmol/L (ref 22–32)
Calcium: 9.2 mg/dL (ref 8.9–10.3)
Chloride: 99 mmol/L (ref 98–111)
Creatinine, Ser: 0.59 mg/dL (ref 0.44–1.00)
GFR, Estimated: >60 mL/min (ref >60)
Glucose, Bld: 91 mg/dL (ref 70–99)
Potassium: 4 mmol/L (ref 3.5–5.1)
Sodium: 136 mmol/L (ref 135–145)
Total Bilirubin: 0.3 mg/dL (ref 0.3–1.2)
Total Protein: 8.1 g/dL (ref 6.5–8.1)

## 2022-10-10 LAB — CBC WITH DIFFERENTIAL/PLATELET
Abs Immature Granulocytes: 0.2 10*3/uL — ABNORMAL HIGH (ref 0.00–0.07)
Basophils Absolute: 0.1 10*3/uL (ref 0.0–0.1)
Basophils Relative: 0 %
Eosinophils Absolute: 0.2 10*3/uL (ref 0.0–0.5)
Eosinophils Relative: 1 %
HCT: 30.4 % — ABNORMAL LOW (ref 36.0–46.0)
Hemoglobin: 8.7 g/dL — ABNORMAL LOW (ref 12.0–15.0)
Immature Granulocytes: 2 %
Lymphocytes Relative: 13 %
Lymphs Abs: 1.8 10*3/uL (ref 0.7–4.0)
MCH: 19.1 pg — ABNORMAL LOW (ref 26.0–34.0)
MCHC: 28.6 g/dL — ABNORMAL LOW (ref 30.0–36.0)
MCV: 66.8 fL — ABNORMAL LOW (ref 80.0–100.0)
Monocytes Absolute: 0.5 10*3/uL (ref 0.1–1.0)
Monocytes Relative: 4 %
Neutro Abs: 10.8 10*3/uL — ABNORMAL HIGH (ref 1.7–7.7)
Neutrophils Relative %: 80 %
Platelets: 465 10*3/uL — ABNORMAL HIGH (ref 150–400)
RBC: 4.55 MIL/uL (ref 3.87–5.11)
RDW: 19.9 % — ABNORMAL HIGH (ref 11.5–15.5)
WBC: 13.5 10*3/uL — ABNORMAL HIGH (ref 4.0–10.5)
nRBC: 0 % (ref 0.0–0.2)

## 2022-10-10 LAB — LACTIC ACID, PLASMA: Lactic Acid, Venous: 0.9 mmol/L (ref 0.5–1.9)

## 2022-10-10 MED ORDER — BISACODYL 10 MG RE SUPP: 10.0000 mg | Freq: Every day | RECTAL | Status: DC | PRN

## 2022-10-10 MED ORDER — VANCOMYCIN HCL 2000 MG/400ML IV SOLN: 2000.0000 mg | Freq: Once | INTRAVENOUS | Status: DC

## 2022-10-10 MED ORDER — VANCOMYCIN HCL 2000 MG/400ML IV SOLN
2000.0000 mg | Freq: Once | INTRAVENOUS | Status: AC
  Administered 2022-10-10: 2000 mg via INTRAVENOUS
  Filled 2022-10-10 (×2): qty 400

## 2022-10-10 MED ORDER — ONDANSETRON HCL 4 MG PO TABS: 4.0000 mg | ORAL_TABLET | Freq: Four times a day (QID) | ORAL | Status: DC | PRN

## 2022-10-10 MED ORDER — VANCOMYCIN HCL IN DEXTROSE 1-5 GM/200ML-% IV SOLN: 1000.0000 mg | Freq: Once | INTRAVENOUS | Status: DC

## 2022-10-10 MED ORDER — SODIUM CHLORIDE 0.9 % IV SOLN: 3.0000 g | Freq: Four times a day (QID) | INTRAVENOUS | Status: DC

## 2022-10-10 MED ORDER — POLYETHYLENE GLYCOL 3350 17 G PO PACK: 17.0000 g | PACK | Freq: Every day | ORAL | Status: DC | PRN

## 2022-10-10 MED ORDER — SENNA 8.6 MG PO TABS
1.0000 | ORAL_TABLET | Freq: Two times a day (BID) | ORAL | Status: DC
  Administered 2022-10-10 – 2022-10-16 (×10): 8.6 mg via ORAL
  Filled 2022-10-10 (×11): qty 1

## 2022-10-10 MED ORDER — ONDANSETRON HCL 4 MG/2ML IJ SOLN: 4.0000 mg | Freq: Four times a day (QID) | INTRAMUSCULAR | Status: DC | PRN

## 2022-10-10 MED ORDER — OXYCODONE HCL 5 MG PO TABS
10.0000 mg | ORAL_TABLET | ORAL | Status: DC | PRN
  Administered 2022-10-10: 15 mg via ORAL
  Filled 2022-10-10: qty 3

## 2022-10-10 MED ORDER — POTASSIUM CHLORIDE IN NACL 20-0.45 MEQ/L-% IV SOLN
INTRAVENOUS | Status: DC
  Filled 2022-10-10 (×2): qty 1000

## 2022-10-10 MED ORDER — ADULT MULTIVITAMIN W/MINERALS CH
1.0000 | ORAL_TABLET | Freq: Every day | ORAL | Status: DC
  Administered 2022-10-11 – 2022-10-16 (×6): 1 via ORAL
  Filled 2022-10-10 (×6): qty 1

## 2022-10-10 MED ORDER — DIPHENHYDRAMINE HCL 25 MG PO CAPS: 25.0000 mg | ORAL_CAPSULE | Freq: Four times a day (QID) | ORAL | Status: DC | PRN

## 2022-10-10 MED ORDER — METHOCARBAMOL 1000 MG/10ML IJ SOLN: 500.0000 mg | Freq: Four times a day (QID) | INTRAVENOUS | Status: DC | PRN

## 2022-10-10 MED ORDER — SODIUM CHLORIDE 0.9 % IV SOLN
2.0000 g | Freq: Once | INTRAVENOUS | Status: AC
  Administered 2022-10-10: 2 g via INTRAVENOUS
  Filled 2022-10-10: qty 12.5

## 2022-10-10 MED ORDER — METHOCARBAMOL 500 MG PO TABS
500.0000 mg | ORAL_TABLET | Freq: Four times a day (QID) | ORAL | Status: DC | PRN
  Administered 2022-10-10 – 2022-10-16 (×7): 500 mg via ORAL
  Filled 2022-10-10 (×8): qty 1

## 2022-10-10 MED ORDER — MAGNESIUM CITRATE PO SOLN: 1.0000 | Freq: Once | ORAL | Status: DC | PRN

## 2022-10-10 MED ORDER — HYDROMORPHONE HCL 1 MG/ML IJ SOLN: 0.5000 mg | INTRAMUSCULAR | Status: DC | PRN

## 2022-10-10 MED ORDER — OXYCODONE HCL 5 MG PO TABS: 5.0000 mg | ORAL_TABLET | ORAL | Status: DC | PRN

## 2022-10-10 MED ORDER — VANCOMYCIN HCL IN DEXTROSE 1-5 GM/200ML-% IV SOLN
1000.0000 mg | Freq: Once | INTRAVENOUS | Status: AC
  Administered 2022-10-10: 1000 mg via INTRAVENOUS
  Filled 2022-10-10: qty 200

## 2022-10-10 MED ORDER — VANCOMYCIN HCL IN DEXTROSE 1-5 GM/200ML-% IV SOLN: 1000.0000 mg | Freq: Two times a day (BID) | INTRAVENOUS | Status: DC

## 2022-10-10 MED ORDER — ACETAMINOPHEN 325 MG PO TABS: 325.0000 mg | ORAL_TABLET | Freq: Four times a day (QID) | ORAL | Status: DC | PRN

## 2022-10-10 MED ORDER — CHLORPROMAZINE HCL 25 MG PO TABS: 25.0000 mg | ORAL_TABLET | Freq: Three times a day (TID) | ORAL | Status: DC | PRN

## 2022-10-10 MED ORDER — ACETAMINOPHEN 500 MG PO TABS
1000.0000 mg | ORAL_TABLET | Freq: Four times a day (QID) | ORAL | Status: AC
  Administered 2022-10-10 – 2022-10-11 (×3): 1000 mg via ORAL
  Filled 2022-10-10 (×3): qty 2

## 2022-10-10 MED ORDER — SODIUM CHLORIDE 0.9 % IV SOLN
3.0000 g | Freq: Four times a day (QID) | INTRAVENOUS | Status: DC
  Administered 2022-10-11 – 2022-10-16 (×20): 3 g via INTRAVENOUS
  Filled 2022-10-10 (×21): qty 8

## 2022-10-10 MED ORDER — FAMOTIDINE 20 MG PO TABS: 20.0000 mg | ORAL_TABLET | Freq: Two times a day (BID) | ORAL | Status: DC | PRN

## 2022-10-10 MED ORDER — ZOLPIDEM TARTRATE 5 MG PO TABS: 5.0000 mg | ORAL_TABLET | Freq: Every evening | ORAL | Status: DC | PRN

## 2022-10-10 MED ORDER — DOCUSATE SODIUM 100 MG PO CAPS
100.0000 mg | ORAL_CAPSULE | Freq: Two times a day (BID) | ORAL | Status: DC
  Administered 2022-10-10 – 2022-10-16 (×10): 100 mg via ORAL
  Filled 2022-10-10 (×11): qty 1

## 2022-10-10 NOTE — ED Notes (Signed)
Unable to obtain IV access. Attempted x2 RN's with [place]. PA notified.

## 2022-10-10 NOTE — Progress Notes (Addendum)
Pharmacy Antibiotic Note  Lauren Allen is a 32 y.o. female admitted on 10/10/2022 with wound infection. Patient was transferred to [facility] with a complex proximal forearm with soft tissue necrosis involving skin, subcutaneous tissue, and superficial layers of musculature. Patient PMH includes IV drug use. Pharmacy has been consulted for unasyn and vancomycin dosing. WBC 13.5, sCr 0.5 (bl~<1), lactate 0.9, afebrile  Plan: Unasyn 3g IV every 6 hours  Vancomycin 2g IV x1 Vancomycin 1000 mg IV every 12 hours (AUC 498, Vd 0.5, sCr 0.8 (for calc)) Monitor renal function, WBC, fever curve Follow up plans with orthopedic surgery/plastic surgery  Height: 5\' 3"  (160 cm) Weight: 108.9 kg (240 lb) IBW/kg (Calculated) : 52.4  Temp (24hrs), Avg:98.6 F (37 C), Min:98.4 F (36.9 C), Max:98.8 F (37.1 C)  Recent Labs  Lab 10/10/22 1632  WBC 13.5*  CREATININE 0.59  LATICACIDVEN 0.9    Estimated Creatinine Clearance: 119.5 mL/min (by C-G formula based on SCr of 0.59 mg/dL).    Allergies  Allergen Reactions   Sulfa Antibiotics Palpitations    Antimicrobials this admission: Unasyn 6/18 >>  Vancomycin 6/18 >>   Microbiology results: 6/18 BCx:   Thank you for allowing pharmacy to be a part of this patient's care.  Arabella Merles, PharmD. [facility] Acute Care PGY-1 10/10/2022 9:48 PM

## 2022-10-10 NOTE — ED Notes (Signed)
Lab called. Lab samples clotted.

## 2022-10-10 NOTE — Progress Notes (Signed)
Patient with IVDU and complex proximal forearm soft tissue necrosis involving skin, subcutaneous tissue, and what appears to be the superficial layers of the musculature.  I have seen 1 case like this in my residency training 20 years ago, secondary to black tar heroin injection.  This is going to require complex soft tissue reconstruction with probable flap transfer, which will involve plastic surgery, less likely to require orthopedics, and what is effectively burn unit level complexity of wound care.  This is beyond the scope of what [facility] has to offer, I have recommended tertiary institution referral.  Teryl Lucy, MD

## 2022-10-10 NOTE — ED Triage Notes (Signed)
Patient here POV from Home.  Endorses picking at old scars historically. More recently has been picking at a Wound to Left Forearm. Now has Large Wound with Severe Drainage to Same. No known Fevers. No numbness or Tingling Distally.   NAD Noted during Triage. A&Ox4. GCS 15. Ambulatory.

## 2022-10-10 NOTE — ED Provider Notes (Signed)
  Physical Exam  BP 130/83   Pulse 92   Temp 98.4 F (36.9 C) (Oral)   Resp 17   Ht 5\' 3"  (1.6 m)   Wt 108.9 kg   SpO2 99%   BMI 42.51 kg/m   Physical Exam Vitals and nursing note reviewed.  Constitutional:      General: She is not in acute distress.    Appearance: Normal appearance. She is normal weight. She is not ill-appearing.  HENT:     Head: Normocephalic and atraumatic.  Pulmonary:     Effort: Pulmonary effort is normal. No respiratory distress.  Abdominal:     General: Abdomen is flat.  Musculoskeletal:        General: Normal range of motion.     Cervical back: Neck supple.  Skin:    General: Skin is warm and dry.     Comments: Large wound to L forearm covered in bandaging  Neurological:     Mental Status: She is alert and oriented to person, place, and time.  Psychiatric:        Mood and Affect: Mood normal.        Behavior: Behavior normal.     Procedures  Procedures  ED Course / MDM   Clinical Course as of 10/10/22 2036  Tue Oct 10, 2022  1714 WBC(!): 13.5 [JS]    Clinical Course User Index [JS] Claude Manges, PA-C   Medical Decision Making Amount and/or Complexity of Data Reviewed Labs: ordered. Decision-making details documented in ED Course. Radiology: ordered.  Risk Prescription drug management. Decision regarding hospitalization.  Assumed care at shift change from [facility] [place].  Please see her note for full HPI.  32 year old female presenting to the ED for evaluation of a wound to her left forearm.  History of injection drug use.  Borderline tachycardia with a leukocytosis of 13.5.  Will need admission for surgical intervention.  Plan to speak with [facility] hospitalist.  1930-spoke with hospitalist Dr. Margo Aye who declines admission stating the patient needs higher level of care and in-house plastic surgery.  1950-orthopedics Dr. Dion Saucier will admit patient.  Dr. Lajoyce Corners with orthopedics and Dr. Ulice Bold with plastic surgery are both on board.  Patient updated with plan. Dr. Dion Saucier will place admission orders. Stable at the time of admission.      Mora Bellman 10/10/22 2036    Terrilee Files, MD 10/10/22 2152

## 2022-10-10 NOTE — Progress Notes (Addendum)
After further discussion, it has been very challenging to get tertiary level support, and our plastic surgery consultants are not available until next week, however I do not think that is safe for her to be managed as an outpatient, and I we will plan for temporizing debridement with wound VAC placement either tomorrow, or the next day.    Plan for transfer to [facility] with the hospitalist service, orthopedic consultation, temorizing I&D tomorrow with Dr. Aldean Baker, n.p.o. after midnight,, definitive surgical intervention with plastic surgery, Dr. Ulice Bold, next week.  Eulas Post, MD

## 2022-10-10 NOTE — ED Provider Notes (Signed)
[place] [facility]ERGENC[facility] DE[facility]MENT AT [facility] Provider Note   CSN: [id number] Arrival date & time: 10/10/22  1221     History  Chief Complaint  Patient presents with   Wound Check    Lauren Allen is a 32 y.o. female.  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  Reports she had a prior history of opioid abuse, states that she would pick wounds from her left arm constantly, however she has been treating this with dressings, along with over-the-counter ointments.  Now she states, the wound has appeared to be worse.  Patient does report she has not used in 7 years, however recently approximately 10 days ago she did sniff some fentanyl.  She is currently on methadone.  She has not had any fever or chills.  Denies any other complaints.  The history is provided by the patient.  Wound Check This is a new problem. The current episode started more than 1 week ago. The problem occurs constantly. The problem has been gradually worsening. Pertinent negatives include no chest pain, no abdominal pain, no headaches and no shortness of breath. Nothing aggravates the symptoms. She has tried nothing for the symptoms.       Home Medications Prior to Admission medications   Medication Sig Start Date End Date Taking? Authorizing Provider  methadone (DOLOPHINE) 10 MG/ML solution Take 145 mg by mouth daily.    Yes [provider]  bisacodyl (DULCOLAX) 10 MG suppository Place 1 suppository (10 mg total) rectally as needed for moderate constipation. Patient not taking: Reported on 10/10/2022 08/28/22   Linwood Dibbles, MD  nitrofurantoin, macrocrystal-monohydrate, (MACROBID) 100 MG capsule Take 1 capsule (100 mg total) by mouth 2 (two) times daily. Patient not taking: Reported on 10/10/2022 05/31/21   Pricilla Loveless, MD  omeprazole (PRILOSEC) 40 MG capsule Take 1 capsule (40 mg total) by mouth 2 (two) times daily. Patient  not taking: Reported on 10/10/2022 01/04/18   Meryl Dare, MD  polyethylene glycol-electrolytes (NULYTELY) 420 g solution Take 100 ml every 1-2 hours until constipation relieved Patient not taking: Reported on 10/10/2022 08/28/22   Linwood Dibbles, MD      Allergies    Sulfa antibiotics    Review of Systems   Review of Systems  Constitutional:  Positive for chills. Negative for fever.  HENT:  Negative for sore throat.   Respiratory:  Negative for shortness of breath.   Cardiovascular:  Negative for chest pain.  Gastrointestinal:  Negative for abdominal pain.  Genitourinary:  Negative for flank pain.  Skin:  Positive for wound.  Neurological:  Negative for headaches.  All other systems reviewed and are negative.   Physical Exam Updated Vital Signs BP 118/77 (BP Location: Right Arm)   Pulse 72   Temp 98.3 F (36.8 C) (Axillary)   Resp 16   Ht 5\' 3"  (1.6 m)   Wt 108.9 kg   SpO2 100%   BMI 42.51 kg/m  Physical Exam Vitals and nursing note reviewed.  Constitutional:      Appearance: Normal appearance.  HENT:     Head: Normocephalic and atraumatic.     Mouth/Throat:     Mouth: Mucous membranes are moist.  Cardiovascular:     Rate and Rhythm: Tachycardia present.     Pulses:          Radial pulses are 2+ on the left side.  Pulmonary:     Effort: Pulmonary  effort is normal.     Breath sounds: No wheezing or rales.  Abdominal:     General: Abdomen is flat.  Musculoskeletal:     Cervical back: Normal range of motion and neck supple.  Skin:    General: Skin is warm and dry.     Findings: Erythema present.     Comments: Erythema, active drainage along with changes in the skin.  Wound is large, with mild odor.  Neurological:     Mental Status: She is alert and oriented to person, place, and time.          ED Results / Procedures / Treatments   Labs (all labs ordered are listed, but only abnormal results are displayed) Labs Reviewed  SURGICAL PCR SCREEN - Abnormal;  Notable for the following components:      Result Value   MRSA, PCR POSITIVE (*)    Staphylococcus aureus POSITIVE (*)    All other components within normal limits  CBC WITH DIFFERENTIAL/PLATELET - Abnormal; Notable for the following components:   WBC 13.5 (*)    Hemoglobin 8.7 (*)    HCT 30.4 (*)    MCV 66.8 (*)    MCH 19.1 (*)    MCHC 28.6 (*)    RDW 19.9 (*)    Platelets 465 (*)    Neutro Abs 10.8 (*)    Abs Immature Granulocytes 0.20 (*)    All other components within normal limits  CULTURE, BLOOD (ROUTINE X 2)  CULTURE, BLOOD (ROUTINE X 2)  COMPREHENSIVE METABOLIC PANEL  LACTIC ACID, PLASMA  LACTIC ACID, PLASMA  HIV ANTIBODY (ROUTINE TESTING W REFLEX)    EKG None  Radiology DG Forearm Left  Result Date: 10/10/2022 CLINICAL DATA:  Open skin wound in left forearm EXAM: LEFT FOREARM - 2 VIEW COMPARISON:  None Available. FINDINGS: No fracture or dislocation is seen. No focal lytic lesions are seen. Large areas of soft tissue defect are noted in the dorsal aspect of proximal forearm and lateral aspect of mid and distal forearm. There are no opaque foreign bodies. IMPRESSION: No fracture or dislocation is seen. No focal lytic lesions are seen. Large areas of open wound noted in the soft tissues. There are no opaque foreign bodies. If there is clinical suspicion for osteomyelitis, follow-up MRI may be considered. Electronically Signed   By: Ernie Avena M.D.   On: 10/10/2022 14:37    Procedures Procedures    Medications Ordered in ED Medications  0.45 % NaCl with KCl 20 mEq / L infusion (has no administration in time range)  methocarbamol (ROBAXIN) tablet 500 mg (500 mg Oral Given 10/10/22 2251)    Or  methocarbamol (ROBAXIN) 500 mg in dextrose 5 % 50 mL IVPB ( Intravenous See Alternative 10/10/22 2251)  zolpidem (AMBIEN) tablet 5 mg (has no administration in time range)  diphenhydrAMINE (BENADRYL) capsule 25-50 mg (has no administration in time range)  docusate sodium  (COLACE) capsule 100 mg (100 mg Oral Patient Refused/Not Given 10/11/22 0906)  senna (SENOKOT) tablet 8.6 mg (8.6 mg Oral Patient Refused/Not Given 10/11/22 0905)  polyethylene glycol (MIRALAX / GLYCOLAX) packet 17 g (has no administration in time range)  bisacodyl (DULCOLAX) suppository 10 mg (has no administration in time range)  magnesium citrate solution 1 Bottle (has no administration in time range)  ondansetron (ZOFRAN) tablet 4 mg (has no administration in time range)    Or  ondansetron (ZOFRAN) injection 4 mg (has no administration in time range)  famotidine (PEPCID) tablet 20  mg (has no administration in time range)  chlorproMAZINE (THORAZINE) tablet 25 mg (has no administration in time range)  multivitamin with minerals tablet 1 tablet (1 tablet Oral Given 10/11/22 0906)  acetaminophen (TYLENOL) tablet 325-650 mg (has no administration in time range)  HYDROmorphone (DILAUDID) injection 0.5-1 mg (has no administration in time range)  acetaminophen (TYLENOL) tablet 1,000 mg (1,000 mg Oral Given 10/11/22 0534)  oxyCODONE (Oxy IR/ROXICODONE) immediate release tablet 5-10 mg (has no administration in time range)  oxyCODONE (Oxy IR/ROXICODONE) immediate release tablet 10-15 mg (15 mg Oral Given 10/10/22 2251)  Ampicillin-Sulbactam (UNASYN) 3 g in sodium chloride 0.9 % 100 mL IVPB (has no administration in time range)  vancomycin (VANCOREADY) IVPB 1250 mg/250 mL (has no administration in time range)  vancomycin (VANCOCIN) IVPB 1000 mg/200 mL premix (0 mg Intravenous Stopped 10/10/22 2034)  ceFEPIme (MAXIPIME) 2 g in sodium chloride 0.9 % 100 mL IVPB (0 g Intravenous Stopped 10/10/22 2034)  vancomycin (VANCOREADY) IVPB 2000 mg/400 mL (2,000 mg Intravenous New Bag/Given 10/10/22 2354)    ED Course/ Medical Decision Making/ A&P Clinical Course as of 10/11/22 0908  Tue Oct 10, 2022  1714 WBC(!): 13.5 [JS]    Clinical Course User Index [JS] Claude Manges, PA-C                              Medical Decision Making Amount and/or Complexity of Data Reviewed Labs: ordered. Decision-making details documented in ED Course. Radiology: ordered.  Risk Prescription drug management. Decision regarding hospitalization.   This patient presents to the ED for concern of left arm wound, this involves a number of treatment options, and is a complaint that carries with it a high risk of complications and morbidity.  The differential diagnosis includes sepsis, cellulitis versus osteomyelitis.    Co morbidities: Discussed in HPI   Brief History:  See HPI.   EMR reviewed including pt PMHx, past surgical history and past visits to ER.   See HPI for more details   Lab Tests:  I ordered and independently interpreted labs.  The pertinent results include:    CBC with a leukocytosis of 13.5 , hemoglobin is decrease from her baseline, she's been having bleeding to the wound for several  months. CMP with no electrolyte abnormality. Creatine level is within normal limits. Lactic acid is negative. Blood cultures are pending.    Imaging Studies:  DG Forearm left showed: IMPRESSION:  No fracture or dislocation is seen. No focal lytic lesions are seen.  Large areas of open wound noted in the soft tissues. There are no  opaque foreign bodies. If there is clinical suspicion for  osteomyelitis, follow-up MRI may be considered.    Unfortunately, we are unable to obtain an MRI at [facility] due to lack of resources.   Medicines ordered:  I ordered medication including vancomycin, cefepime  for care for infection Reevaluation of the patient after these medicines showed that the patient stayed the same I have reviewed the patients home medicines and have made adjustments as needed  Consults:  I requested consultation with Dr. Dion Saucier of orthopedics,  and discussed lab and imaging findings as well as pertinent plan - [facility], will need burn  level treatment and higher quality of care.  6:27 PM Spoke to Dr. Ferd Hibbs of [facility] plastic surgery who recommended admission via medicine. Patient will need to be accepted by medicine. She also recommends wet  to dry dressings in the meantime.   Reevaluation:  After the interventions noted above I re-evaluated patient and found that they have :stayed the same   Social Determinants of Health:  The patient's social determinants of health were a factor in the care of this patient  Problem List / ED Course:  Patient here with left arm wound that has been ongoing for months, worsen over the last couple of days. Reports this began with picking at her skin, with some mental component to it but has not seek help.  Patient blood work was obtained although there was delay on this.  CBC remarkable for leukocytosis, hemoglobin slightly decreased with hemoglobin of 8.7, decreased from prior, does report wound sometimes bleeds.  CMP with no electrolyte derangement.  LFTs are within normal limits.  Blood cultures were obtained at this time.  X-ray shows some gas, however no signs of osteomyelitis and therefore they are recommending left arm MRI, however we are unable to obtain this due to staffing issues. I discussed this case with specialist on-call Dr. Dion Saucier of orthopedics who reports that patient would likely need higher level of care, with plastic surgery intervention along with transfer to a burn center.  Her exam here is reassuring, she is neurovascularly intact, there is swelling noted to the left forearm however does have good flexion extension of the left wrist.  Capillary refill is intact.  Call placed to [facility] in order to obtain further specialist recommendations via plastic surgery. All these results were discussed with patient at length, she is hesitant about being admitted to the hospital at this time, however we discussed the risks such as death and loss of limb if she were to  leave AMA.  Antibiotics such as cefepime and vancomycin have been started to help cover for cellulitis.  6:18 PM Spoke to nurse on [facility], she will try to arrange for plastics consultation along with likely hospitalist admission.  I spoke to [place] over at the hospitalist admission team of [facility], patient has been accepted but waitlisted, she will remain in the emergency department for approximately 12 to 24 hours.  This was discussed with patient along with family at the bedside. Upon recheck, patient has now been cleared to be admitted by Dr. Dion Saucier who will have Dr. Drucilla Schmidt to operate on her tomorrow morning, low would have plastic follow-up when in the hospital.  Call placed to hospitalist team via [facility] health.  Dispostion:  After consideration of the diagnostic results and the patients response to treatment, I feel that the patent would benefit from admission for further management.     Portions of this note were generated with Scientist, clinical (histocompatibility and immunogenetics). Dictation errors may occur despite best attempts at proofreading.  Final Clinical Impression(s) / ED Diagnoses Final diagnoses:  Arm wound, left, initial encounter    Rx / DC Orders ED Discharge Orders     None         Claude Manges, PA-C 10/11/22 0908    Ernie Avena, MD 10/12/22 1218

## 2022-10-10 NOTE — Progress Notes (Addendum)
  Plan of Care Note for accepted transfer   Patient: Lauren Allen MRN: [id number]   DOA: 10/10/2022  Facility requesting transfer: [facility] Requesting Provider: Derrek Gu, PA-EDP  Reason for transfer: Complex proximal forearm soft tissue necrosis involving skin, subcutaneous tissue, and what appears to be the superficial layers of the musculature, per orthopedic surgery's description, Dr. Dion Saucier.   Facility course: The patient is a 32 year old female with history of IV drug use who presents with left forearm severe wound, reportedly after picking at it for several months.    In the ED, tachycardic, tachypneic, with leukocytosis.  X-ray of the left forearm revealed the following findings:  No fracture or dislocation is seen. No focal lytic lesions are seen. Large areas of open wound noted in the soft tissues. There are no opaque foreign bodies. If there is clinical suspicion for osteomyelitis, follow-up MRI may be considered.         EDP discussed the case with orthopedic surgery Dr. Dion Saucier who recommended transfer to tertiary center.  The patient will need to be seen by plastic surgery.  Per orthopedic surgery this is beyond the scope of what [place] has to offer, requires burn unit level complexity of wound care.  Declined the admission to the hospitalist service and recommended transfer to tertiary care center with burn unit.  Plastic surgery service is currently not available at [facility] health.  Plastic surgery, Dr. Ulice Bold, will be available next week.    Author: Darlin Drop, DO 10/10/2022  Check [email or web address] for on-call coverage.  Nursing staff, Please call [facility] Admits & Consults System-Wide number on Amion as soon as patient's arrival, so appropriate admitting provider can evaluate the pt.

## 2022-10-11 ENCOUNTER — Encounter (HOSPITAL_COMMUNITY): Payer: Self-pay | Admitting: Orthopedic Surgery

## 2022-10-11 ENCOUNTER — Inpatient Hospital Stay (HOSPITAL_COMMUNITY): Payer: Self-pay | Admitting: Anesthesiology

## 2022-10-11 ENCOUNTER — Encounter (HOSPITAL_COMMUNITY)
Admission: EM | Disposition: A | Discharge status: Home / Self Care | Payer: Self-pay | Source: Home / Self Care | Attending: Orthopedic Surgery

## 2022-10-11 ENCOUNTER — Other Ambulatory Visit: Payer: Self-pay

## 2022-10-11 DIAGNOSIS — L02414 Cutaneous abscess of left upper limb: Secondary | ICD-10-CM

## 2022-10-11 DIAGNOSIS — Z6841 Body Mass Index (BMI) 40.0 and over, adult: Secondary | ICD-10-CM

## 2022-10-11 DIAGNOSIS — S41109A Unspecified open wound of unspecified upper arm, initial encounter: Secondary | ICD-10-CM

## 2022-10-11 DIAGNOSIS — S41102A Unspecified open wound of left upper arm, initial encounter: Secondary | ICD-10-CM

## 2022-10-11 DIAGNOSIS — F418 Other specified anxiety disorders: Secondary | ICD-10-CM

## 2022-10-11 HISTORY — PX: I & D EXTREMITY: SHX5045

## 2022-10-11 LAB — HIV ANTIBODY (ROUTINE TESTING W REFLEX): HIV Screen 4th Generation wRfx: NONREACTIVE

## 2022-10-11 LAB — POCT PREGNANCY, URINE: Preg Test, Ur: NEGATIVE

## 2022-10-11 LAB — AEROBIC/ANAEROBIC CULTURE W GRAM STAIN (SURGICAL/DEEP WOUND)

## 2022-10-11 LAB — TYPE AND SCREEN
ABO/RH(D): A POS
Antibody Screen: NEGATIVE

## 2022-10-11 LAB — CULTURE, BLOOD (ROUTINE X 2): Culture: NO GROWTH

## 2022-10-11 LAB — SURGICAL PCR SCREEN
MRSA, PCR: POSITIVE — AB
Staphylococcus aureus: POSITIVE — AB

## 2022-10-11 SURGERY — IRRIGATION AND DEBRIDEMENT EXTREMITY
Anesthesia: General | Site: Arm Lower | Laterality: Left

## 2022-10-11 MED ORDER — MIDAZOLAM HCL 2 MG/2ML IJ SOLN
INTRAMUSCULAR | Status: DC | PRN
  Administered 2022-10-11: 2 mg via INTRAVENOUS

## 2022-10-11 MED ORDER — POVIDONE-IODINE 10 % EX SWAB
2.0000 | Freq: Once | CUTANEOUS | Status: AC
  Administered 2022-10-11: 2 via TOPICAL

## 2022-10-11 MED ORDER — OXYCODONE HCL 5 MG PO TABS
10.0000 mg | ORAL_TABLET | ORAL | Status: DC | PRN
  Administered 2022-10-11 – 2022-10-16 (×18): 15 mg via ORAL
  Filled 2022-10-11 (×18): qty 3

## 2022-10-11 MED ORDER — PHENOL 1.4 % MT LIQD: 1.0000 | OROMUCOSAL | Status: DC | PRN

## 2022-10-11 MED ORDER — METHADONE HCL 10 MG/ML PO CONC: 145.0000 mg | Freq: Every day | ORAL | Status: DC

## 2022-10-11 MED ORDER — CHLORHEXIDINE GLUCONATE 0.12 % MT SOLN
OROMUCOSAL | Status: AC
  Administered 2022-10-11: 15 mL via OROMUCOSAL
  Filled 2022-10-11: qty 15

## 2022-10-11 MED ORDER — ALUM & MAG HYDROXIDE-SIMETH 200-200-20 MG/5ML PO SUSP: 15.0000 mL | ORAL | Status: DC | PRN

## 2022-10-11 MED ORDER — DOCUSATE SODIUM 100 MG PO CAPS: 100.0000 mg | ORAL_CAPSULE | Freq: Every day | ORAL | Status: DC

## 2022-10-11 MED ORDER — SODIUM CHLORIDE 0.9% IV SOLUTION: Freq: Once | INTRAVENOUS | Status: DC

## 2022-10-11 MED ORDER — 0.9 % SODIUM CHLORIDE (POUR BTL) OPTIME
TOPICAL | Status: DC | PRN
  Administered 2022-10-11: 1000 mL

## 2022-10-11 MED ORDER — MUPIROCIN 2 % EX OINT
1.0000 | TOPICAL_OINTMENT | Freq: Two times a day (BID) | CUTANEOUS | Status: AC
  Administered 2022-10-11 – 2022-10-15 (×9): 1 via NASAL
  Filled 2022-10-11: qty 22

## 2022-10-11 MED ORDER — ZINC SULFATE 220 (50 ZN) MG PO CAPS
220.0000 mg | ORAL_CAPSULE | Freq: Every day | ORAL | Status: DC
  Administered 2022-10-11 – 2022-10-16 (×6): 220 mg via ORAL
  Filled 2022-10-11 (×6): qty 1

## 2022-10-11 MED ORDER — VANCOMYCIN HCL 1250 MG/250ML IV SOLN
1250.0000 mg | Freq: Two times a day (BID) | INTRAVENOUS | Status: DC
  Administered 2022-10-12 – 2022-10-13 (×3): 1250 mg via INTRAVENOUS
  Filled 2022-10-11 (×4): qty 250

## 2022-10-11 MED ORDER — FENTANYL CITRATE (PF) 250 MCG/5ML IJ SOLN
INTRAMUSCULAR | Status: DC | PRN
  Administered 2022-10-11 (×2): 50 ug via INTRAVENOUS
  Administered 2022-10-11: 100 ug via INTRAVENOUS
  Administered 2022-10-11: 50 ug via INTRAVENOUS

## 2022-10-11 MED ORDER — HYDROMORPHONE HCL 1 MG/ML IJ SOLN
0.5000 mg | INTRAMUSCULAR | Status: DC | PRN
  Administered 2022-10-11 – 2022-10-14 (×3): 1 mg via INTRAVENOUS
  Filled 2022-10-11 (×3): qty 1

## 2022-10-11 MED ORDER — OXYCODONE HCL 5 MG PO TABS: 5.0000 mg | ORAL_TABLET | Freq: Once | ORAL | Status: DC | PRN

## 2022-10-11 MED ORDER — CEFAZOLIN SODIUM-DEXTROSE 2-4 GM/100ML-% IV SOLN
2.0000 g | INTRAVENOUS | Status: AC
  Administered 2022-10-11: 2 g via INTRAVENOUS

## 2022-10-11 MED ORDER — HYDROMORPHONE HCL 1 MG/ML IJ SOLN
INTRAMUSCULAR | Status: AC
  Filled 2022-10-11: qty 1

## 2022-10-11 MED ORDER — CHLORHEXIDINE GLUCONATE 0.12 % MT SOLN: 15.0000 mL | Freq: Once | OROMUCOSAL | Status: AC

## 2022-10-11 MED ORDER — GUAIFENESIN-DM 100-10 MG/5ML PO SYRP: 15.0000 mL | ORAL_SOLUTION | ORAL | Status: DC | PRN

## 2022-10-11 MED ORDER — POLYETHYLENE GLYCOL 3350 17 G PO PACK: 17.0000 g | PACK | Freq: Every day | ORAL | Status: DC | PRN

## 2022-10-11 MED ORDER — ONDANSETRON HCL 4 MG/2ML IJ SOLN
INTRAMUSCULAR | Status: DC | PRN
  Administered 2022-10-11: 4 mg via INTRAVENOUS

## 2022-10-11 MED ORDER — ONDANSETRON HCL 4 MG/2ML IJ SOLN: 4.0000 mg | Freq: Four times a day (QID) | INTRAMUSCULAR | Status: DC | PRN

## 2022-10-11 MED ORDER — MAGNESIUM SULFATE 2 GM/50ML IV SOLN: 2.0000 g | Freq: Every day | INTRAVENOUS | Status: DC | PRN

## 2022-10-11 MED ORDER — MIDAZOLAM HCL 2 MG/2ML IJ SOLN
INTRAMUSCULAR | Status: AC
  Filled 2022-10-11: qty 2

## 2022-10-11 MED ORDER — ACETAMINOPHEN 325 MG PO TABS
325.0000 mg | ORAL_TABLET | Freq: Four times a day (QID) | ORAL | Status: DC | PRN
  Administered 2022-10-12 – 2022-10-15 (×3): 650 mg via ORAL
  Filled 2022-10-11 (×3): qty 2

## 2022-10-11 MED ORDER — VITAMIN C 500 MG PO TABS
1000.0000 mg | ORAL_TABLET | Freq: Every day | ORAL | Status: DC
  Administered 2022-10-11 – 2022-10-16 (×6): 1000 mg via ORAL
  Filled 2022-10-11 (×6): qty 2

## 2022-10-11 MED ORDER — CHLORHEXIDINE GLUCONATE 4 % EX SOLN: 60.0000 mL | Freq: Once | CUTANEOUS | Status: DC

## 2022-10-11 MED ORDER — ORAL CARE MOUTH RINSE: 15.0000 mL | Freq: Once | OROMUCOSAL | Status: AC

## 2022-10-11 MED ORDER — PROPOFOL 10 MG/ML IV BOLUS
INTRAVENOUS | Status: AC
  Filled 2022-10-11: qty 20

## 2022-10-11 MED ORDER — OXYCODONE HCL 5 MG/5ML PO SOLN: 5.0000 mg | Freq: Once | ORAL | Status: DC | PRN

## 2022-10-11 MED ORDER — HYDRALAZINE HCL 20 MG/ML IJ SOLN: 5.0000 mg | INTRAMUSCULAR | Status: DC | PRN

## 2022-10-11 MED ORDER — HYDROMORPHONE HCL 1 MG/ML IJ SOLN
INTRAMUSCULAR | Status: AC
  Filled 2022-10-11: qty 0.5

## 2022-10-11 MED ORDER — LIDOCAINE 2% (20 MG/ML) 5 ML SYRINGE
INTRAMUSCULAR | Status: DC | PRN
  Administered 2022-10-11: 60 mg via INTRAVENOUS

## 2022-10-11 MED ORDER — OXYCODONE HCL 5 MG PO TABS
5.0000 mg | ORAL_TABLET | ORAL | Status: DC | PRN
  Administered 2022-10-14: 10 mg via ORAL
  Administered 2022-10-14: 5 mg via ORAL
  Filled 2022-10-11 (×2): qty 2

## 2022-10-11 MED ORDER — SODIUM CHLORIDE 0.9 % IV SOLN: INTRAVENOUS | Status: DC

## 2022-10-11 MED ORDER — FENTANYL CITRATE (PF) 250 MCG/5ML IJ SOLN
INTRAMUSCULAR | Status: AC
  Filled 2022-10-11: qty 5

## 2022-10-11 MED ORDER — CEFAZOLIN SODIUM-DEXTROSE 2-4 GM/100ML-% IV SOLN
INTRAVENOUS | Status: AC
  Filled 2022-10-11: qty 100

## 2022-10-11 MED ORDER — PROPOFOL 10 MG/ML IV BOLUS
INTRAVENOUS | Status: DC | PRN
  Administered 2022-10-11: 200 mg via INTRAVENOUS

## 2022-10-11 MED ORDER — POTASSIUM CHLORIDE CRYS ER 20 MEQ PO TBCR: 20.0000 meq | EXTENDED_RELEASE_TABLET | Freq: Every day | ORAL | Status: DC | PRN

## 2022-10-11 MED ORDER — HYDROMORPHONE HCL 1 MG/ML IJ SOLN
0.2500 mg | INTRAMUSCULAR | Status: DC | PRN
  Administered 2022-10-11 (×2): 0.5 mg via INTRAVENOUS

## 2022-10-11 MED ORDER — LACTATED RINGERS IV SOLN: INTRAVENOUS | Status: DC

## 2022-10-11 MED ORDER — CHLORHEXIDINE GLUCONATE CLOTH 2 % EX PADS
6.0000 | MEDICATED_PAD | Freq: Every day | CUTANEOUS | Status: AC
  Administered 2022-10-12 – 2022-10-16 (×5): 6 via TOPICAL

## 2022-10-11 MED ORDER — LABETALOL HCL 5 MG/ML IV SOLN: 10.0000 mg | INTRAVENOUS | Status: DC | PRN

## 2022-10-11 MED ORDER — MAGNESIUM CITRATE PO SOLN: 1.0000 | Freq: Once | ORAL | Status: DC | PRN

## 2022-10-11 MED ORDER — METHADONE HCL 5 MG PO TABS
145.0000 mg | ORAL_TABLET | Freq: Every day | ORAL | Status: DC
  Administered 2022-10-11: 145 mg via ORAL
  Filled 2022-10-11: qty 1

## 2022-10-11 MED ORDER — HYDROMORPHONE HCL 1 MG/ML IJ SOLN
INTRAMUSCULAR | Status: DC | PRN
  Administered 2022-10-11: .5 mg via INTRAVENOUS

## 2022-10-11 MED ORDER — JUVEN PO PACK
1.0000 | PACK | Freq: Two times a day (BID) | ORAL | Status: DC
  Administered 2022-10-12 – 2022-10-16 (×7): 1 via ORAL
  Filled 2022-10-11 (×7): qty 1

## 2022-10-11 MED ORDER — PANTOPRAZOLE SODIUM 40 MG PO TBEC
40.0000 mg | DELAYED_RELEASE_TABLET | Freq: Every day | ORAL | Status: DC
  Administered 2022-10-11 – 2022-10-16 (×6): 40 mg via ORAL
  Filled 2022-10-11 (×6): qty 1

## 2022-10-11 MED ORDER — METOPROLOL TARTRATE 5 MG/5ML IV SOLN: 2.0000 mg | INTRAVENOUS | Status: DC | PRN

## 2022-10-11 MED ORDER — BISACODYL 5 MG PO TBEC: 5.0000 mg | DELAYED_RELEASE_TABLET | Freq: Every day | ORAL | Status: DC | PRN

## 2022-10-11 SURGICAL SUPPLY — 38 items
BAG COUNTER SPONGE SURGICOUNT (BAG) IMPLANT
BAG SPNG CNTER NS LX DISP (BAG)
BLADE SURG 21 STRL SS (BLADE) ×1 IMPLANT
BNDG CMPR 5X6 CHSV STRCH STRL (GAUZE/BANDAGES/DRESSINGS)
BNDG COHESIVE 6X5 TAN ST LF (GAUZE/BANDAGES/DRESSINGS) IMPLANT
BNDG GAUZE DERMACEA FLUFF 4 (GAUZE/BANDAGES/DRESSINGS) ×2 IMPLANT
BNDG GZE DERMACEA 4 6PLY (GAUZE/BANDAGES/DRESSINGS)
COVER SURGICAL LIGHT HANDLE (MISCELLANEOUS) ×2 IMPLANT
DRAPE DERMATAC (DRAPES) IMPLANT
DRAPE U-SHAPE 47X51 STRL (DRAPES) ×1 IMPLANT
DRESSING VERAFLO CLEANS CC MED (GAUZE/BANDAGES/DRESSINGS) IMPLANT
DRSG ADAPTIC 3X8 NADH LF (GAUZE/BANDAGES/DRESSINGS) ×1 IMPLANT
DRSG VERAFLO CLEANSE CC MED (GAUZE/BANDAGES/DRESSINGS) ×1
DURAPREP 26ML APPLICATOR (WOUND CARE) ×1 IMPLANT
ELECT REM PT RETURN 9FT ADLT (ELECTROSURGICAL)
ELECTRODE REM PT RTRN 9FT ADLT (ELECTROSURGICAL) IMPLANT
GAUZE SPONGE 4X4 12PLY STRL (GAUZE/BANDAGES/DRESSINGS) ×1 IMPLANT
GLOVE BIOGEL PI IND STRL 9 (GLOVE) ×1 IMPLANT
GLOVE SURG ORTHO 9.0 STRL STRW (GLOVE) ×1 IMPLANT
GOWN STRL REUS W/ TWL XL LVL3 (GOWN DISPOSABLE) ×2 IMPLANT
GOWN STRL REUS W/TWL XL LVL3 (GOWN DISPOSABLE) ×1
GRAFT SKIN WND SURGICLOSE M95 (Tissue) IMPLANT
HANDPIECE INTERPULSE COAX TIP (DISPOSABLE)
KIT BASIN OR (CUSTOM PROCEDURE TRAY) ×1 IMPLANT
KIT TURNOVER KIT B (KITS) ×1 IMPLANT
MANIFOLD NEPTUNE II (INSTRUMENTS) ×1 IMPLANT
NS IRRIG 1000ML POUR BTL (IV SOLUTION) ×1 IMPLANT
PACK ORTHO EXTREMITY (CUSTOM PROCEDURE TRAY) ×1 IMPLANT
PAD ARMBOARD 7.5X6 YLW CONV (MISCELLANEOUS) ×2 IMPLANT
PAD NEG PRESSURE SENSATRAC (MISCELLANEOUS) IMPLANT
SET HNDPC FAN SPRY TIP SCT (DISPOSABLE) IMPLANT
STOCKINETTE IMPERVIOUS 9X36 MD (GAUZE/BANDAGES/DRESSINGS) IMPLANT
SUT ETHILON 2 0 PSLX (SUTURE) ×1 IMPLANT
SWAB COLLECTION DEVICE MRSA (MISCELLANEOUS) ×1 IMPLANT
SWAB CULTURE ESWAB REG 1ML (MISCELLANEOUS) IMPLANT
TOWEL GREEN STERILE (TOWEL DISPOSABLE) ×1 IMPLANT
TUBE CONNECTING 12X1/4 (SUCTIONS) ×1 IMPLANT
YANKAUER SUCT BULB TIP NO VENT (SUCTIONS) ×1 IMPLANT

## 2022-10-11 NOTE — Progress Notes (Signed)
Patient goes to a pain clinic and she is concerned regarding her stay here.  She need to re-start her Methadone listed on her home meds today.

## 2022-10-11 NOTE — Progress Notes (Signed)
OT Cancellation Note  Patient Details Name: Lauren Allen MRN: [id number] DOB: 01-Mar-1991   Cancelled Treatment:    Reason Eval/Treat Not Completed: Other (comment) (Pt unavailable, was in processing of being discharge and leaving the unit. No DC orders were in to allow time for OT to plan before discharge.)  10/11/2022  AB, OTR/L  Acute Rehabilitation Services  Office: [phone]   Tristan Schroeder 10/11/2022, 11:55 AM

## 2022-10-11 NOTE — Progress Notes (Signed)
Pharmacy Antibiotic Note- follow-up  Lauren Allen is a 32 y.o. female admitted on 10/10/2022 with wound infection. Patient was transferred to [facility] with a complex proximal forearm with soft tissue necrosis involving skin, subcutaneous tissue, and superficial layers of musculature. Patient PMH includes IV drug use. Pharmacy has been consulted for unasyn and vancomycin dosing. WBC 13.5, sCr 0.5 (bl~<1), lactate 0.9, afebrile  Plan: Continue Unasyn 3g IV every 6 hours  Adjust Vancomycin 1250 mg IV every 12 hours (AUC 443, Vd 0.5, sCr 0.8 (for calc)) Monitor renal function, WBC, fever curve Follow up plans with orthopedic surgery/plastic surgery  Height: 5\' 3"  (160 cm) Weight: 108.9 kg (240 lb) IBW/kg (Calculated) : 52.4  Temp (24hrs), Avg:98.5 F (36.9 C), Min:98.1 F (36.7 C), Max:99 F (37.2 C)  Recent Labs  Lab 10/10/22 1632  WBC 13.5*  CREATININE 0.59  LATICACIDVEN 0.9     Estimated Creatinine Clearance: 119.5 mL/min (by C-G formula based on SCr of 0.59 mg/dL).    Allergies  Allergen Reactions   Sulfa Antibiotics Palpitations    Antimicrobials this admission: Unasyn 6/18 >>  Vancomycin 6/18 >>   Microbiology results: 6/18 BCx: [id number]  Thank you for allowing pharmacy to be a part of this patient's care.  Greta Doom BS, PharmD, BCPS Clinical Pharmacist 10/11/2022 8:16 AM  Contact: [phone] after 3 PM  "Be curious, not judgmental..." -Debbora Dus

## 2022-10-11 NOTE — H&P (Signed)
PREOPERATIVE H&P  Chief Complaint: Left forearm wound  HPI: Lauren Allen is a 32 y.o. 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  She states she has been dealing with the wound on and off for many years but it has really gotten worse over the last 7 to 8 months.  She has a history of skin picking and picks this area.  She states she is partaking in IV drug use twice in the last 2 weeks but otherwise has been clean for the last few years.  She has been managing this wound at home.  Denies pain elsewhere.  Denies chest pain, shortness of breath, nausea, vomiting, abdominal pain.  Denies paresthesias.  No other complaints.   Past Medical History:  Diagnosis Date   Anxiety    Crohn's disease (HCC)    Depression    Headache(784.0)    Ileitis    Opiate addiction (HCC)    Past Surgical History:  Procedure Laterality Date   COLONOSCOPY     HAND SURGERY Left    I & D EXTREMITY Left 11/06/2016   Procedure: IRRIGATION AND DEBRIDEMENT LEFT HAND;  Surgeon: Bradly Bienenstock, MD;  Location: [facility] OR;  Service: Orthopedics;  Laterality: Left;   I & D EXTREMITY Left 11/08/2016   Procedure: IRRIGATION AND DEBRIDEMENT LEFT HAND;  Surgeon: Bradly Bienenstock, MD;  Location: [facility] OR;  Service: Orthopedics;  Laterality: Left;   TONSILLECTOMY     Social History   Socioeconomic History   Marital status: Married    Spouse name: Not on file   Number of children: Not on file   Years of education: Not on file   Highest education level: Not on file  Occupational History   Not on file  Tobacco Use   Smoking status: Some Days    Packs/day: .1    Types: Cigarettes    Last attempt to quit: 12/01/2016    Years since quitting: 5.8   Smokeless tobacco: Never  Vaping Use   Vaping Use: Some days  Substance and Sexual Activity   Alcohol use: Not Currently    Comment: occasional, not  during pregnancy   Drug use: Not Currently    Comment: Heroine    Sexual activity: Not Currently    Birth control/protection: None  Other Topics Concern   Not on file  Social History Narrative   Has one coke a day   Social Determinants of Health   Financial Resource Strain: Not on file  Food Insecurity: No Food Insecurity (10/11/2022)   Hunger Vital Sign    Worried About Running Out of Food in the Last Year: Never true    Ran Out of Food in the Last Year: Never true  Transportation Needs: No Transportation Needs (10/11/2022)   PRAPARE - Administrator, Civil Service (Medical): No    Lack of Transportation (Non-Medical): No  Physical Activity: Not on file  Stress: Not on file  Social Connections: Not on file   Family History  Problem Relation Age of Onset   Colon cancer Maternal Grandfather    Celiac disease Maternal Aunt    Diabetes Maternal Grandmother    Hypothyroidism Maternal Grandmother    Heart disease Unknown    Diabetes Mother    Hypertension Mother    Hypothyroidism Mother    Hypertension Father    Heart disease Father  Hypothyroidism Paternal Grandmother    Allergies  Allergen Reactions   Sulfa Antibiotics Palpitations   Prior to Admission medications   Medication Sig Start Date End Date Taking? Authorizing Provider  methadone (DOLOPHINE) 10 MG/ML solution Take 145 mg by mouth daily.    Yes [provider]  bisacodyl (DULCOLAX) 10 MG suppository Place 1 suppository (10 mg total) rectally as needed for moderate constipation. Patient not taking: Reported on 10/10/2022 08/28/22   Linwood Dibbles, MD  nitrofurantoin, macrocrystal-monohydrate, (MACROBID) 100 MG capsule Take 1 capsule (100 mg total) by mouth 2 (two) times daily. Patient not taking: Reported on 10/10/2022 05/31/21   Pricilla Loveless, MD  omeprazole (PRILOSEC) 40 MG capsule Take 1 capsule (40 mg total) by mouth 2 (two) times daily. Patient not taking: Reported on 10/10/2022 01/04/18   Meryl Dare, MD  polyethylene glycol-electrolytes (NULYTELY) 420 g solution Take 100 ml every 1-2 hours until constipation relieved Patient not taking: Reported on 10/10/2022 08/28/22   Linwood Dibbles, MD     Positive ROS: All other systems have been reviewed and were otherwise negative with the exception of those mentioned in the HPI and as above.  Physical Exam: General: Alert, no acute distress Cardiovascular: No pedal edema Respiratory: No cyanosis, no use of accessory musculature GI: No organomegaly, abdomen is soft and non-tender Skin: Please see below Neurologic: Sensation intact distally Psychiatric: Patient is competent for consent with normal mood and affect Lymphatic: No axillary or cervical lymphadenopathy  MUSCULOSKELETAL: Please see photo below, necrotic tissue covering forearm. Full ROM at left elbow and wrist. All fingers flex, extend, and abduct. Sensation intact to all fingers. Fingers warm and well perfused.         Assessment: Skin necrosis of left forearm    Plan: Plan for debridement with wound vac placement with Dr. Lajoyce Corners later today. Please keep NPO. Confirmed with anesthesia ok to give baseline methadone this morning.    Armida Sans, PA-C    10/11/2022 8:39 AM

## 2022-10-11 NOTE — Op Note (Signed)
10/11/2022  2:36 PM  PATIENT:  Lauren Allen    PRE-OPERATIVE DIAGNOSIS:  ABSCESS OF LEFT FOREARM  POST-OPERATIVE DIAGNOSIS:  Same  PROCEDURE: Excisional DEBRIDEMENT OF ARM with excision of skin soft tissue muscle and fascia. Application cleanse choice wound VAC sponge. Tissue sent for cultures. Application 95 cm Kerecis micro graft.  SURGEON:  Nadara Mustard, MD  PHYSICIAN ASSISTANT:None ANESTHESIA:   General  PREOPERATIVE INDICATIONS:  Lauren Allen is a  32 y.o. female with a diagnosis of ABSCESS OF LEFT FOREARM who failed conservative measures and elected for surgical management.    The risks benefits and alternatives were discussed with the patient preoperatively including but not limited to the risks of infection, bleeding, nerve injury, cardiopulmonary complications, the need for revision surgery, among others, and the patient was willing to proceed.  OPERATIVE IMPLANTS:   Implant Name Type Inv. Item Serial No. Manufacturer Lot No. LRB No. Used Action  GRAFT SKIN WND SURGICLOSE M95 - [id number] Tissue GRAFT SKIN WND SURGICLOSE M95  [employer] [id number] Left 1 Implanted    @ENCIMAGES @  OPERATIVE FINDINGS: After debridement the wound bed had healthy granulation tissue.  Wound measures 16 x 16 cm.  Tissue sent for cultures.  OPERATIVE PROCEDURE: Patient was brought the operating room underwent a general anesthetic.  After adequate levels anesthesia were obtained patient's left upper extremity was prepped using DuraPrep draped into a sterile field a timeout was called.  A 21 blade knife was used to excise skin and soft tissue muscle and fascia from the wound edges.  A rondure was further used to debride necrotic tissue within the base of the wound.  After tissue margins were clear the wound was irrigated with normal saline Leichter cautery was used for hemostasis.  Wound measures 16 x 16 cm after debridement.  95 cm of Kerecis micro graft was then applied to the wound  this was covered with a cleanse choice sponge overwrapped with Coban.  This had a good suction fit.  Patient was extubated taken the PACU in stable condition.   DISCHARGE PLANNING:  Antibiotic duration: Continue IV antibiotics, adjust according to culture sensitivities  Weightbearing: Not applicable  Pain medication: Opioid pathway  Dressing care/ Wound VAC: Continue wound VAC  Ambulatory devices: As needed  Plan for return to the operating room on Friday for repeat debridement repeat tissue grafting.

## 2022-10-11 NOTE — Transfer of Care (Signed)
Immediate Anesthesia Transfer of Care Note  Patient: Lauren Allen  Procedure(s) Performed: IRRIGATION AND DEBRIDEMENT OF ARM (Left: Arm Lower)  Patient Location: PACU  Anesthesia Type:General  Level of Consciousness: awake and patient cooperative  Airway & Oxygen Therapy: Patient Spontanous Breathing  Post-op Assessment: Report given to RN and Post -op Vital signs reviewed and stable  Post vital signs: Reviewed and stable  Last Vitals:  Vitals Value Taken Time  BP 125/90 10/11/22 1435  Temp 36.4 C 10/11/22 1435  Pulse 76 10/11/22 1439  Resp 19 10/11/22 1439  SpO2 100 % 10/11/22 1439  Vitals shown include unvalidated device data.  Last Pain:  Vitals:   10/11/22 1219  TempSrc:   PainSc: 7       Patients Stated Pain Goal: 0 (10/11/22 0534)  Complications: No notable events documented.

## 2022-10-11 NOTE — Consult Note (Signed)
ORTHOPAEDIC CONSULTATION  REQUESTING PHYSICIAN: Teryl Lucy, MD  Chief Complaint: Chronic ulceration left forearm.  HPI: Lauren Allen is a 32 y.o. female who presents with a several year history of a large necrotic ulcer left forearm.  Patient states that she developed this from picking her skin.  She is status post several debridements of the left hand in 2018.  Patient states that she is on methadone for opioid addiction.  Past Medical History:  Diagnosis Date   Anxiety    Crohn's disease (HCC)    Depression    Headache(784.0)    Ileitis    Opiate addiction (HCC)    Past Surgical History:  Procedure Laterality Date   COLONOSCOPY     HAND SURGERY Left    I & D EXTREMITY Left 11/06/2016   Procedure: IRRIGATION AND DEBRIDEMENT LEFT HAND;  Surgeon: Bradly Bienenstock, MD;  Location: [facility] OR;  Service: Orthopedics;  Laterality: Left;   I & D EXTREMITY Left 11/08/2016   Procedure: IRRIGATION AND DEBRIDEMENT LEFT HAND;  Surgeon: Bradly Bienenstock, MD;  Location: [facility] OR;  Service: Orthopedics;  Laterality: Left;   TONSILLECTOMY     Social History   Socioeconomic History   Marital status: Married    Spouse name: Not on file   Number of children: Not on file   Years of education: Not on file   Highest education level: Not on file  Occupational History   Not on file  Tobacco Use   Smoking status: Some Days    Packs/day: .1    Types: Cigarettes    Last attempt to quit: 12/01/2016    Years since quitting: 5.8   Smokeless tobacco: Never  Vaping Use   Vaping Use: Some days  Substance and Sexual Activity   Alcohol use: Not Currently    Comment: occasional, not during pregnancy   Drug use: Not Currently    Comment: Heroine    Sexual activity: Not Currently    Birth control/protection: None  Other Topics Concern   Not on file  Social History Narrative   Has one coke a day   Social Determinants of Health   Financial Resource Strain: Not on file  Food Insecurity: No Food  Insecurity (10/11/2022)   Hunger Vital Sign    Worried About Running Out of Food in the Last Year: Never true    Ran Out of Food in the Last Year: Never true  Transportation Needs: No Transportation Needs (10/11/2022)   PRAPARE - Administrator, Civil Service (Medical): No    Lack of Transportation (Non-Medical): No  Physical Activity: Not on file  Stress: Not on file  Social Connections: Not on file   Family History  Problem Relation Age of Onset   Colon cancer Maternal Grandfather    Celiac disease Maternal Aunt    Diabetes Maternal Grandmother    Hypothyroidism Maternal Grandmother    Heart disease Unknown    Diabetes Mother    Hypertension Mother    Hypothyroidism Mother    Hypertension Father    Heart disease Father    Hypothyroidism Paternal Grandmother    - negative except otherwise stated in the family history section Allergies  Allergen Reactions   Sulfa Antibiotics Palpitations   Prior to Admission medications   Medication Sig Start Date End Date Taking? Authorizing Provider  methadone (DOLOPHINE) 10 MG/ML solution Take 145 mg by mouth daily.    Yes [provider]  bisacodyl (DULCOLAX) 10 MG suppository Place 1 suppository (  10 mg total) rectally as needed for moderate constipation. Patient not taking: Reported on 10/10/2022 08/28/22   Linwood Dibbles, MD  nitrofurantoin, macrocrystal-monohydrate, (MACROBID) 100 MG capsule Take 1 capsule (100 mg total) by mouth 2 (two) times daily. Patient not taking: Reported on 10/10/2022 05/31/21   Pricilla Loveless, MD  omeprazole (PRILOSEC) 40 MG capsule Take 1 capsule (40 mg total) by mouth 2 (two) times daily. Patient not taking: Reported on 10/10/2022 01/04/18   Meryl Dare, MD  polyethylene glycol-electrolytes (NULYTELY) 420 g solution Take 100 ml every 1-2 hours until constipation relieved Patient not taking: Reported on 10/10/2022 08/28/22   Linwood Dibbles, MD   DG Forearm Left  Result Date: 10/10/2022 CLINICAL DATA:   Open skin wound in left forearm EXAM: LEFT FOREARM - 2 VIEW COMPARISON:  None Available. FINDINGS: No fracture or dislocation is seen. No focal lytic lesions are seen. Large areas of soft tissue defect are noted in the dorsal aspect of proximal forearm and lateral aspect of mid and distal forearm. There are no opaque foreign bodies. IMPRESSION: No fracture or dislocation is seen. No focal lytic lesions are seen. Large areas of open wound noted in the soft tissues. There are no opaque foreign bodies. If there is clinical suspicion for osteomyelitis, follow-up MRI may be considered. Electronically Signed   By: Ernie Avena M.D.   On: 10/10/2022 14:37   - pertinent xrays, CT, MRI studies were reviewed and independently interpreted  Positive ROS: All other systems have been reviewed and were otherwise negative with the exception of those mentioned in the HPI and as above.  Physical Exam: General: Alert, no acute distress Psychiatric: Patient is competent for consent with normal mood and affect Lymphatic: No axillary or cervical lymphadenopathy Cardiovascular: No pedal edema Respiratory: No cyanosis, no use of accessory musculature GI: No organomegaly, abdomen is soft and non-tender    Images:  @ENCIMAGES @  Labs:  Lab Results  Component Value Date   HGBA1C 5.4 10/24/2017   ESRSEDRATE 37 (H) 01/04/2018   ESRSEDRATE 12 05/19/2013   ESRSEDRATE 24 (H) 10/17/2011   CRP 1.2 01/04/2018   CRP 0.8 05/19/2013   CRP 3 10/17/2011   REPTSTATUS PENDING 10/10/2022   GRAMSTAIN  11/06/2016    MODERATE WBC PRESENT,BOTH PMN AND MONONUCLEAR MODERATE GRAM POSITIVE COCCI FEW GRAM VARIABLE ROD    CULT  10/10/2022    NO GROWTH < 12 HOURS Performed at [facility], [address], [place], [place] [address]    LABORGA STREPTOCOCCUS GROUP C 11/06/2016    Lab Results  Component Value Date   ALBUMIN 3.8 10/10/2022   ALBUMIN 4.3 01/04/2018   ALBUMIN 4.1 08/01/2017        Latest Ref  Rng & Units 10/10/2022    4:32 PM 03/29/2018   11:17 AM 01/04/2018   11:37 AM  CBC EXTENDED  WBC 4.0 - 10.5 K/uL 13.5  11.6  13.4   RBC 3.87 - 5.11 MIL/uL 4.55  4.91  5.09   Hemoglobin 12.0 - 15.0 g/dL 8.7  19.1  47.8   HCT 29.5 - 46.0 % 30.4  40.3  40.4   Platelets 150 - 400 K/uL 465  318  346.0   NEUT# 1.7 - 7.7 K/uL 10.8  8.2  10.6   Lymph# 0.7 - 4.0 K/uL 1.8  2.7  2.3     Neurologic: Patient does not have protective sensation bilateral lower extremities.   MUSCULOSKELETAL:   Skin: Examination there is a large necrotic ulcer  involving almost the entire forearm.  There is swelling nonviable tissue.  Patient has good motor function in her hand.  Patient's white cell count is 13.5.  Hemoglobin 8.7.  No ascending cellulitis.  Assessment: Assessment: Large necrotic ulcer present for several years encompassing the left forearm.  Plan: Plan: Will plan for debridement tissue graft and wound VAC therapy.  Anticipate returning to the operating room on Friday.  Discussed risk and benefits including need for multiple surgeries and the potential for neurovascular deficits secondary to the debridement.  Patient states she understands wished to proceed at this time.  Thank you for the consult and the opportunity to see Ms. Shanecia Tuccillo, MD [facility] [phone] 7:36 AM

## 2022-10-11 NOTE — Progress Notes (Signed)
PT Cancellation Note  Patient Details Name: Lauren Allen MRN: [id number] DOB: [birth year]   Cancelled Treatment:    Reason Eval/Treat Not Completed: Patient declined, states she is mobilizing well without difficulties and denies any balance or gait issues.   She would like to see an occupational therapist however to discuss ADLs and UE management post-op.   Noted OT has been ordered; will defer to them and sign-off of PT services. Please re-order for any concerning change in mobility status. Thank you!  Kathlyn Sacramento, PT, DPT [facility]  Rehabilitation Services Physical Therapist Office: [phone] Website: [email or web address]    Berton Mount 10/11/2022, 5:11 PM

## 2022-10-11 NOTE — Anesthesia Preprocedure Evaluation (Addendum)
Anesthesia Evaluation  Patient identified by MRN, date of birth, ID band Patient awake    Reviewed: Allergy & Precautions, H&P , NPO status , Patient's Chart, lab work & pertinent test results  History of Anesthesia Complications Negative for: history of anesthetic complications  Airway Mallampati: II  TM Distance: >3 FB Neck ROM: full    Dental no notable dental hx. (+) Teeth Intact, Dental Advisory Given   Pulmonary Current Smoker, former smoker   Pulmonary exam normal breath sounds clear to auscultation       Cardiovascular negative cardio ROS Normal cardiovascular exam Rhythm:regular Rate:Normal     Neuro/Psych  Headaches  Anxiety Depression     negative psych ROS   GI/Hepatic negative GI ROS,,,(+)     substance abuse (opiates)    Endo/Other    Morbid obesity  Renal/GU negative Renal ROS  negative genitourinary   Musculoskeletal   Abdominal  (+) + obese  Peds  Hematology negative hematology ROS (+)   Anesthesia Other Findings 145mg  Methadone qd  Reproductive/Obstetrics (+) Pregnancy                             Anesthesia Physical Anesthesia Plan  ASA: 2  Anesthesia Plan: General   Post-op Pain Management: Ofirmev IV (intra-op)*   Induction: Intravenous  PONV Risk Score and Plan: 3 and Ondansetron, Dexamethasone and Treatment may vary due to age or medical condition  Airway Management Planned: Oral ETT and LMA  Additional Equipment: None  Intra-op Plan:   Post-operative Plan:   Informed Consent: I have reviewed the patients History and Physical, chart, labs and discussed the procedure including the risks, benefits and alternatives for the proposed anesthesia with the patient or authorized representative who has indicated his/her understanding and acceptance.       Plan Discussed with: Anesthesiologist and CRNA  Anesthesia Plan Comments: ( )         Anesthesia Quick Evaluation

## 2022-10-11 NOTE — Anesthesia Procedure Notes (Signed)
Procedure Name: LMA Insertion Date/Time: 10/11/2022 2:02 PM  Performed by: Gus Puma, CRNAPre-anesthesia Checklist: Patient identified, Emergency Drugs available, Suction available and Patient being monitored Patient Re-evaluated:Patient Re-evaluated prior to induction Oxygen Delivery Method: Circle System Utilized Preoxygenation: Pre-oxygenation with 100% oxygen Induction Type: IV induction Ventilation: Mask ventilation without difficulty LMA: LMA inserted LMA Size: 4.0 Number of attempts: 1 Airway Equipment and Method: Bite block Placement Confirmation: positive ETCO2 Tube secured with: Tape Dental Injury: Teeth and Oropharynx as per pre-operative assessment

## 2022-10-11 NOTE — Progress Notes (Signed)
Patient is a very hard iv stick.  Patient complained that the Vanc IV ABX was burning so I slowed down the rate however it is taking very long to infuse.  She has allowed me to turn the rate back up but I had to push forward the 0600 scheduled Unasyn IV.

## 2022-10-12 ENCOUNTER — Encounter (HOSPITAL_COMMUNITY): Payer: Self-pay | Admitting: Orthopedic Surgery

## 2022-10-12 DIAGNOSIS — F17210 Nicotine dependence, cigarettes, uncomplicated: Secondary | ICD-10-CM

## 2022-10-12 DIAGNOSIS — S51802A Unspecified open wound of left forearm, initial encounter: Secondary | ICD-10-CM

## 2022-10-12 DIAGNOSIS — L02413 Cutaneous abscess of right upper limb: Secondary | ICD-10-CM

## 2022-10-12 LAB — CULTURE, BLOOD (ROUTINE X 2)

## 2022-10-12 LAB — CBC
HCT: 29.8 % — ABNORMAL LOW (ref 36.0–46.0)
Hemoglobin: 8.6 g/dL — ABNORMAL LOW (ref 12.0–15.0)
MCH: 19.3 pg — ABNORMAL LOW (ref 26.0–34.0)
MCHC: 28.9 g/dL — ABNORMAL LOW (ref 30.0–36.0)
MCV: 67 fL — ABNORMAL LOW (ref 80.0–100.0)
Platelets: 429 10*3/uL — ABNORMAL HIGH (ref 150–400)
RBC: 4.45 MIL/uL (ref 3.87–5.11)
RDW: 19.4 % — ABNORMAL HIGH (ref 11.5–15.5)
WBC: 12.4 10*3/uL — ABNORMAL HIGH (ref 4.0–10.5)
nRBC: 0 % (ref 0.0–0.2)

## 2022-10-12 LAB — BASIC METABOLIC PANEL
Anion gap: 15 (ref 5–15)
BUN: 7 mg/dL (ref 6–20)
CO2: 28 mmol/L (ref 22–32)
Calcium: 9.2 mg/dL (ref 8.9–10.3)
Chloride: 94 mmol/L — ABNORMAL LOW (ref 98–111)
Creatinine, Ser: 0.72 mg/dL (ref 0.44–1.00)
GFR, Estimated: >60 mL/min (ref >60)
Glucose, Bld: 97 mg/dL (ref 70–99)
Potassium: 4.1 mmol/L (ref 3.5–5.1)
Sodium: 137 mmol/L (ref 135–145)

## 2022-10-12 MED ORDER — NICOTINE 7 MG/24HR TD PT24
7.0000 mg | MEDICATED_PATCH | Freq: Every day | TRANSDERMAL | Status: DC
  Administered 2022-10-12 – 2022-10-15 (×4): 7 mg via TRANSDERMAL
  Filled 2022-10-12 (×4): qty 1

## 2022-10-12 MED ORDER — METHADONE HCL 5 MG PO TABS
145.0000 mg | ORAL_TABLET | Freq: Every day | ORAL | Status: DC
  Administered 2022-10-12: 145 mg via ORAL
  Filled 2022-10-12: qty 1

## 2022-10-12 MED ORDER — METHADONE HCL 5 MG PO TABS
145.0000 mg | ORAL_TABLET | Freq: Every day | ORAL | Status: DC
  Administered 2022-10-13 – 2022-10-16 (×4): 145 mg via ORAL
  Filled 2022-10-12 (×4): qty 1
  Filled 2022-10-12: qty 14

## 2022-10-12 NOTE — TOC Initial Note (Signed)
Transition of Care [facility]) - Initial/Assessment Note    Patient Details  Name: Lauren Allen MRN: [id number] Date of Birth: Jul 12, 1990  Transition of [facility]) CM/SW Contact:    Epifanio Lesches, RN Phone Number: 10/12/2022, 12:22 PM  Clinical Narrative:                 Presents with L arm wound, hx of Chrons disease, opiate addiction.     -s/p excisional debridement of arm, 6/19  Plan: return to the operating room on Friday for repeat debridement repeat tissue grafting.   States from home with husband / son. PTA independent with ADL's. No DME usage. Works full time @ [employer]. Pt without PCP, no insurance. NCM shared [facility]. Pt interested. NCM to f/u when d/c closer and establish hospital f/u appointment. States active with [employer].  TOC team following and will assist with needs...   Expected Discharge Plan: Home/Self Care Barriers to Discharge: Continued Medical Work up   Patient Goals and CMS Choice            Expected Discharge Plan and Services   Discharge Planning Services: CM Consult, Follow-up appt scheduled                                          Prior Living Arrangements/Services   Lives with:: Spouse Patient language and need for interpreter reviewed:: Yes Do you feel safe going back to the place where you live?: Yes      Need for Family Participation in Patient Care: Yes (Comment) Care giver support system in place?: Yes (comment)   Criminal Activity/Legal Involvement Pertinent to Current Situation/Hospitalization: No - Comment as needed  Activities of Daily Living Home Assistive Devices/Equipment: None ADL Screening (condition at time of admission) Patient's cognitive ability adequate to safely complete daily activities?: Yes Is the patient deaf or have difficulty hearing?: No Does the patient have difficulty seeing, even when wearing glasses/contacts?: No Does the patient have difficulty concentrating,  remembering, or making decisions?: No Patient able to express need for assistance with ADLs?: Yes Does the patient have difficulty dressing or bathing?: No Independently performs ADLs?: Yes (appropriate for developmental age) Does the patient have difficulty walking or climbing stairs?: No Weakness of Legs: None Weakness of Arms/Hands: None  Permission Sought/Granted   Permission granted to share information with : Yes, Verbal Permission Granted  Share Information with NAME: Velna Ochs  Spouse  [phone]           Emotional Assessment Appearance:: Appears stated age Attitude/Demeanor/Rapport: Gracious   Orientation: : Oriented to Self, Oriented to Place, Oriented to  Time, Oriented to Situation Alcohol / Substance Use: Tobacco Use, Illicit Drugs (Pt with hx of substance abuse. States with recent relapse ( fentanyl). Had been off drugs x 7 yrs.) Psych Involvement: No (comment)  Admission diagnosis:  Infection of forearm [L08.9] Arm wound, left, initial encounter [S41.102A] Patient Active Problem List   Diagnosis Date Noted   Arm wound, left, initial encounter 10/11/2022   Infection of forearm 10/10/2022   SVD (spontaneous vaginal delivery) 12/04/2016   Postpartum care following vaginal delivery 12/04/2016   Term pregnancy 12/03/2016   Opiate addiction (HCC) 11/06/2016   Abscess of left hand 11/06/2016   Pregnancy and infectious disease, third trimester 11/06/2016   Sepsis (HCC) 11/06/2016   Post-operative state 11/06/2016   Heroin abuse (HCC) 09/13/2016  ANXIETY 06/30/2009   DEPRESSION 06/30/2009   PCP:  Pcp, No Pharmacy:   [id number] - [place], [place] - [address] [place] [address] Phone: [phone] Fax: [phone]  [id number] - [place], [place] - [address] [place] [address] Phone: [phone] Fax:  [phone]  [facility] [address] [place] [address] Phone: [phone] Fax: [phone]     Social Determinants of Health (SDOH) Social History: SDOH Screenings   Food Insecurity: No Food Insecurity (10/11/2022)  Housing: Patient Declined (10/11/2022)  Transportation Needs: No Transportation Needs (10/11/2022)  Utilities: Not At Risk (10/11/2022)  Tobacco Use: High Risk (10/12/2022)   SDOH Interventions:     Readmission Risk Interventions     No data to display

## 2022-10-12 NOTE — Consult Note (Signed)
[facility]    Date of Admission:  10/10/2022   Total days of inpatient antibiotics 2        Reason for Consult: Arm abscess    Principal Problem:   Infection of forearm Active Problems:   Arm wound, left, initial encounter   Assessment: 32 year old female admitted with #Left forearm abscess status post debridement -Patient notes that she used to shoot up in her left arm.  About a year ago she had been picking a scab on her forearm.  She is a Production designer, theatre/television/film at [employer] and did not want to take time away from work as such waited to come in for care.  Patient told me she has not partaken IV drugs for the past 6 years, but did snuff heroin 10 days ago due to severe pain.  Reviewed H&P and noticed that she had partaking in IV drug use twice in the last 2 weeks but otherwise clean for years. -X-ray of the forearm showed large open wound in the soft tissue.  No forearm barrette bodies. - Orthopedics consulted and patient taken to the OR for debridement and VAC exchange.  Noted that skin soft tissue muscle and fascia were excised.  # IVDA with heroin, most recently sniffed cocaine #Anxiety/depression -She goes to a methadone clinic regularly.  She states she has a Veterinary surgeon at the clinic.  Recommendations:  Continue vancomycin and Unasyn for now - Follow-up or cultures - Follow-up on plastic surgery recommendations - Patient is not at home IV antibiotic candidate. Microbiology:   Antibiotics: Unasyn 6/19-present Vancomycin 6/18-present  Cultures: Blood 6/18 no growth Urine  Other 6/19 Staph aureus and E faecalis, Reincubating  HPI: Titanna Augspurger is a 32 y.o. female with history of IVDA, depression/anxiety presents with left lower forearm wound.  Patient said she had had the wound for about a year.  Last used IV drugs twice in last 2 weeks due to pain but has been going to methadone clinic and been clean for years.  X-ray of forearm showed  large open wound.  Orthopedic consulted patient underwent debridement of soft tissue and muscle/fascia.  Cultures pending.  ID engaged for antibiotic recommendations.   Review of Systems: Review of Systems  All other systems reviewed and are negative.   Past Medical History:  Diagnosis Date   Anxiety    Crohn's disease (HCC)    Depression    Headache(784.0)    Ileitis    Opiate addiction (HCC)     Social History   Tobacco Use   Smoking status: Some Days    Packs/day: .1    Types: Cigarettes    Last attempt to quit: 12/01/2016    Years since quitting: 5.8   Smokeless tobacco: Never  Vaping Use   Vaping Use: Some days  Substance Use Topics   Alcohol use: Not Currently    Comment: occasional, not during pregnancy   Drug use: Not Currently    Comment: Heroine     Family History  Problem Relation Age of Onset   Colon cancer Maternal Grandfather    Celiac disease Maternal Aunt    Diabetes Maternal Grandmother    Hypothyroidism Maternal Grandmother    Heart disease Unknown    Diabetes Mother    Hypertension Mother    Hypothyroidism Mother    Hypertension Father    Heart disease Father    Hypothyroidism Paternal Grandmother    Scheduled Meds:  vitamin C  1,000 mg Oral Daily   Chlorhexidine Gluconate Cloth  6 each Topical Q0600   docusate sodium  100 mg Oral BID   [START ON 10/13/2022] methadone  145 mg Oral Daily   multivitamin with minerals  1 tablet Oral Daily   mupirocin ointment  1 Application Nasal BID   nicotine  7 mg Transdermal Daily   nutrition supplement (JUVEN)  1 packet Oral BID BM   pantoprazole  40 mg Oral Daily   senna  1 tablet Oral BID   zinc sulfate  220 mg Oral Daily   Continuous Infusions:  sodium chloride 75 mL/hr at 10/11/22 1605   ampicillin-sulbactam (UNASYN) IV 3 g (10/12/22 1836)   magnesium sulfate bolus IVPB     methocarbamol (ROBAXIN) IV     vancomycin 1,250 mg (10/12/22 1426)   PRN Meds:.acetaminophen, alum & mag  hydroxide-simeth, bisacodyl, bisacodyl, chlorproMAZINE, diphenhydrAMINE, famotidine, guaiFENesin-dextromethorphan, hydrALAZINE, HYDROmorphone (DILAUDID) injection, labetalol, magnesium citrate, magnesium sulfate bolus IVPB, methocarbamol **OR** methocarbamol (ROBAXIN) IV, metoprolol tartrate, ondansetron, ondansetron **OR** [DISCONTINUED] ondansetron (ZOFRAN) IV, oxyCODONE, oxyCODONE, phenol, polyethylene glycol, potassium chloride, zolpidem Allergies  Allergen Reactions   Sulfa Antibiotics Palpitations    OBJECTIVE: Blood pressure 119/76, pulse 76, temperature 97.8 F (36.6 C), temperature source Oral, resp. rate 20, height 5\' 4"  (1.626 m), weight 108.8 kg, SpO2 98 %.  Physical Exam Constitutional:      Appearance: Normal appearance.  HENT:     Head: Normocephalic and atraumatic.     Right Ear: Tympanic membrane normal.     Left Ear: Tympanic membrane normal.     Nose: Nose normal.     Mouth/Throat:     Mouth: Mucous membranes are moist.  Eyes:     Extraocular Movements: Extraocular movements intact.     Conjunctiva/sclera: Conjunctivae normal.     Pupils: Pupils are equal, round, and reactive to light.  Cardiovascular:     Rate and Rhythm: Normal rate and regular rhythm.     Heart sounds: No murmur heard.    No friction rub. No gallop.  Pulmonary:     Effort: Pulmonary effort is normal.     Breath sounds: Normal breath sounds.  Abdominal:     General: Abdomen is flat.     Palpations: Abdomen is soft.  Musculoskeletal:     Comments: Left forearm bandaged  Skin:    General: Skin is warm and dry.  Neurological:     General: No focal deficit present.     Mental Status: She is alert and oriented to person, place, and time.  Psychiatric:        Mood and Affect: Mood normal.     Lab Results Lab Results  Component Value Date   WBC 12.4 (H) 10/12/2022   HGB 8.6 (L) 10/12/2022   HCT 29.8 (L) 10/12/2022   MCV 67.0 (L) 10/12/2022   PLT 429 (H) 10/12/2022    Lab Results   Component Value Date   CREATININE 0.72 10/12/2022   BUN 7 10/12/2022   NA 137 10/12/2022   K 4.1 10/12/2022   CL 94 (L) 10/12/2022   CO2 28 10/12/2022    Lab Results  Component Value Date   ALT 25 10/10/2022   AST 21 10/10/2022   ALKPHOS 122 10/10/2022   BILITOT 0.3 10/10/2022       Danelle Earthly, MD [facility] 10/12/2022, 10:00 PM   I have personally spent 82 minutes involved in face-to-face and non-face-to-face activities for this  patient on the day of the visit. Professional time spent includes the following activities: Preparing to see the patient (review of tests), Obtaining and/or reviewing separately obtained history (admission/discharge record), Performing a medically appropriate examination and/or evaluation , Ordering medications/tests/procedures, referring and communicating with other health care professionals, Documenting clinical information in the EMR, Independently interpreting results (not separately reported), Communicating results to the patient/family/caregiver, Counseling and educating the patient/family/caregiver and Care coordination (not separately reported).

## 2022-10-12 NOTE — H&P (View-Only) (Signed)
Patient ID: Lauren Allen, female   DOB: 02/23/1991, 31 y.o.   MRN: [id number] Patient is postoperative day 1 debridement chronic large necrotic ulcer left forearm.  There is 125 cc in the wound VAC canister.  Tissue cultures from surgery are showing gram-positive cocci in pairs.  Plan for return to the operating room tomorrow. 

## 2022-10-12 NOTE — Evaluation (Signed)
Occupational Therapy Evaluation Patient Details Name: Lauren Allen MRN: [id number] DOB: January 23, 1991 Today's Date: 10/12/2022   History of Present Illness 32 y.o. female admitted on 10/10/2022 with wound infection. Patient was transferred to [facility] with a complex proximal forearm with soft tissue necrosis involving skin, subcutaneous tissue, and superficial layers of musculature. She is s/p LUE debridement 10/11/22. 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 Pt currently presenting with pain in LUE with functional use and AROM but general LUE is WFL. Pt with increase LUE swelling and ROM deficits at the wrist, educated pt on manual edema massage and AAROM wrist exercises while reinforcing AROM of other joints. Educated pt on the use of pursed lip breathing and abdominal breathing techniques for stress management with use of sleeve covers for current management, as well as discussing healthy outlets for anxiety. Pt would benefit from continued acute skilled OT services to address above deficits and help transition to next level of care. Rec patient follow up with physicians orders, may need an evaluation at post acute OT to reassess LUE deficits following recovery.      Recommendations for follow up therapy are one component of a multi-disciplinary discharge planning process, led by the attending physician.  Recommendations may be updated based on patient status, additional functional criteria and insurance authorization.   Assistance Recommended at Discharge PRN  Patient can return home with the following      Functional Status Assessment  Patient has had a recent decline in their functional status and demonstrates the ability to make significant improvements in function in a reasonable and predictable amount of time.  Equipment Recommendations  None recommended by OT    Recommendations  for Other Services       Precautions / Restrictions Restrictions Weight Bearing Restrictions: Yes LUE Weight Bearing: Weight bearing as tolerated      Mobility Bed Mobility Overal bed mobility: Modified Independent                  Transfers                          Balance Overall balance assessment: Mild deficits observed, not formally tested                                         ADL either performed or assessed with clinical judgement   ADL Overall ADL's : Needs assistance/impaired Eating/Feeding: Independent;Sitting   Grooming: Standing;Modified independent   Upper Body Bathing: Sitting;Modified independent   Lower Body Bathing: Sitting/lateral leans;Modified independent   Upper Body Dressing : Sitting;Modified independent   Lower Body Dressing: Sitting/lateral leans;Modified independent   Toilet Transfer: Modified Independent   Toileting- Clothing Manipulation and Hygiene: Modified independent       Functional mobility during ADLs: Modified independent       Vision         Perception     Praxis      Pertinent Vitals/Pain Pain Assessment Pain Assessment: 0-10 Pain Score: 7  Pain Location: LUE, pain sits at a 5/10 at rest Pain Descriptors / Indicators: Aching, Discomfort, Sore Pain Intervention(s): Monitored during session, Repositioned, Limited activity within patient's tolerance     Hand Dominance Right   Extremity/Trunk Assessment Upper Extremity Assessment Upper  Extremity Assessment: Overall WFL for tasks assessed LUE Deficits / Details: ROM WFLs, pain with wrist ext/flex. Weak gross grip strength   Lower Extremity Assessment Lower Extremity Assessment: Overall WFL for tasks assessed   Cervical / Trunk Assessment Cervical / Trunk Assessment: Normal   Communication Communication Communication: No difficulties   Cognition Arousal/Alertness: Awake/alert Behavior During Therapy: WFL for tasks  assessed/performed Overall Cognitive Status: Within Functional Limits for tasks assessed                                       General Comments  Pt expressing with OT and RN the stresses of work environment, pt aunt present during session    Exercises     Shoulder Instructions      Home Living Family/patient expects to be discharged to:: Private residence Living Arrangements: Spouse/significant other Available Help at Discharge: Family;Available 24 hours/day Type of Home: House Home Access: Level entry     Home Layout: One level     Bathroom Shower/Tub: Chief Strategy Officer: Standard         Additional Comments: 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[employer], pt reports a high stress work environment and a hx of picking at the skin due to stress      Prior Functioning/Environment Prior Level of Function : Independent/Modified Independent                        OT Problem List: Decreased strength;Pain;Impaired UE functional use      OT Treatment/Interventions: Self-care/ADL training;Balance training;Other (comment);Therapeutic activities;Patient/family education    OT Goals(Current goals can be found in the care plan section) Acute Rehab OT Goals Patient Stated Goal: To get better OT Goal Formulation: With patient Time For Goal Achievement: 11/02/22 Potential to Achieve Goals: Good ADL Goals Pt Will Perform Grooming: Independently;standing Pt Will Perform Upper Body Bathing: Independently;sitting Pt/caregiver will Perform Home Exercise Program: Increased ROM;Increased strength;Independently;With written HEP provided Additional ADL Goal #1: Pt will independenlty demonstrate proper use of stress management techniques prn  OT Frequency: Min 5X/week    Co-evaluation              AM-PAC OT "6 Clicks" Daily Activity     Outcome Measure Help from another person eating meals?: None Help from another person taking care  of personal grooming?: None Help from another person toileting, which includes using toliet, bedpan, or urinal?: None Help from another person bathing (including washing, rinsing, drying)?: None Help from another person to put on and taking off regular upper body clothing?: None Help from another person to put on and taking off regular lower body clothing?: None 6 Click Score: 24   End of Session Nurse Communication: Mobility status  Activity Tolerance: Patient tolerated treatment well Patient left: in bed;with call bell/phone within reach;with family/visitor present  OT Visit Diagnosis: Pain;Muscle weakness (generalized) (M62.81) Pain - Right/Left: Left Pain - part of body: Arm                Time: 4098-1191 OT Time Calculation (min): 45 min Charges:  OT General Charges $OT Visit: 1 Visit OT Evaluation $OT Eval Low Complexity: 1 Low OT Treatments $Self Care/Home Management : 8-22 mins $Therapeutic Exercise: 8-22 mins  10/12/2022  AB, OTR/L  Acute Rehabilitation Services  Office: [phone]   Tristan Schroeder 10/12/2022, 10:14 AM

## 2022-10-12 NOTE — Progress Notes (Signed)
Patient ID: Lauren Allen, female   DOB: 03/02/1991, 32 y.o.   MRN: [id number] Patient is postoperative day 1 debridement chronic large necrotic ulcer left forearm.  There is 125 cc in the wound VAC canister.  Tissue cultures from surgery are showing gram-positive cocci in pairs.  Plan for return to the operating room tomorrow.

## 2022-10-12 NOTE — Anesthesia Preprocedure Evaluation (Addendum)
Anesthesia Evaluation  Patient identified by MRN, date of birth, ID band Patient awake    Reviewed: Allergy & Precautions, H&P , NPO status , Patient's Chart, lab work & pertinent test results, Unable to perform ROS - Chart review only  History of Anesthesia Complications Negative for: history of anesthetic complications  Airway Mallampati: II  TM Distance: >3 FB Neck ROM: full    Dental no notable dental hx. (+) Teeth Intact, Dental Advisory Given   Pulmonary Current Smoker and Patient abstained from smoking.   Pulmonary exam normal breath sounds clear to auscultation       Cardiovascular (-) angina negative cardio ROS Normal cardiovascular exam Rhythm:regular Rate:Normal     Neuro/Psych  Headaches PSYCHIATRIC DISORDERS Anxiety Depression       GI/Hepatic ,GERD  Medicated and Controlled,,(+)     substance abuse (opiates, methadone)  Crohn's   Endo/Other    Morbid obesityBMI 41  Renal/GU negative Renal ROS     Musculoskeletal  (+)  narcotic dependent  Abdominal  (+) + obese  Peds  Hematology  (+) Blood dyscrasia (Hb 8.2, plt 399k), anemia   Anesthesia Other Findings 145mg  Methadone qd  Reproductive/Obstetrics negative OB ROS                             Anesthesia Physical Anesthesia Plan  ASA: 3  Anesthesia Plan: General   Post-op Pain Management: Ofirmev IV (intra-op)*, Precedex, Toradol IV (intra-op)*, Gabapentin PO (pre-op)* and Tylenol PO (pre-op)*   Induction: Intravenous  PONV Risk Score and Plan: 4 or greater and Ondansetron, Dexamethasone, Treatment may vary due to age or medical condition and Midazolam  Airway Management Planned: LMA  Additional Equipment: None  Intra-op Plan:   Post-operative Plan:   Informed Consent: I have reviewed the patients History and Physical, chart, labs and discussed the procedure including the risks, benefits and alternatives for the  proposed anesthesia with the patient or authorized representative who has indicated his/her understanding and acceptance.     Dental advisory given  Plan Discussed with: Anesthesiologist, CRNA and Surgeon  Anesthesia Plan Comments: ( )        Anesthesia Quick Evaluation

## 2022-10-12 NOTE — Progress Notes (Signed)
Subjective: 1 Day Post-Op s/p Procedure(s): IRRIGATION AND DEBRIDEMENT OF ARM   Patient is alert, oriented. Pain a little worse than yesterday, requesting morning methadone. Has had a bowel movement since admission. Denies numbness or tingling. Denies chest pain, SOB, Calf pain. No nausea/vomiting. No other complaints.    Objective:  PE: VITALS:   Vitals:   10/11/22 1515 10/11/22 1530 10/11/22 1943 10/12/22 0426  BP: 120/72 115/79 (!) 101/58 (!) 152/73  Pulse: 70 80 78 74  Resp: 11 14 20 20   Temp: 98 F (36.7 C) 98.5 F (36.9 C) 98.4 F (36.9 C) 98.4 F (36.9 C)  TempSrc:  Oral Oral Oral  SpO2: 95%  99% 96%  Weight:      Height:       General: sitting up in bed, in no acute distress Resp: normal respiratory effort Cardio: normal rate and rhythm MSK: surgical dressing in place. 150 cc's of bloody drainge in canister of wound vac. Able to flex, extend, and abduct all fingers of left hand. Distal sensation intact.   LABS  Results for orders placed or performed during the hospital encounter of 10/10/22 (from the past 24 hour(s))  Type and screen [facility]     Status: None   Collection Time: 10/11/22 12:15 PM  Result Value Ref Range   ABO/RH(D) A POS    Antibody Screen NEG    Sample Expiration      10/14/2022,2359 Performed at [facility], [address] [place], [place] [address]   Pregnancy, urine POC     Status: None   Collection Time: 10/11/22 12:30 PM  Result Value Ref Range   Preg Test, Ur NEGATIVE NEGATIVE  Aerobic/Anaerobic Culture w Gram Stain (surgical/deep wound)     Status: None (Preliminary result)   Collection Time: 10/11/22  2:18 PM   Specimen: Soft Tissue, Other  Result Value Ref Range   Specimen Description ABSCESS LEFT ARM    Special Requests PT ON ANCEF    Gram Stain      NO WBC SEEN RARE GRAM POSITIVE COCCI IN PAIRS Performed at [facility], [address], [place], [place] [address]    Culture PENDING     Report Status PENDING     DG Forearm Left  Result Date: 10/10/2022 CLINICAL DATA:  Open skin wound in left forearm EXAM: LEFT FOREARM - 2 VIEW COMPARISON:  None Available. FINDINGS: No fracture or dislocation is seen. No focal lytic lesions are seen. Large areas of soft tissue defect are noted in the dorsal aspect of proximal forearm and lateral aspect of mid and distal forearm. There are no opaque foreign bodies. IMPRESSION: No fracture or dislocation is seen. No focal lytic lesions are seen. Large areas of open wound noted in the soft tissues. There are no opaque foreign bodies. If there is clinical suspicion for osteomyelitis, follow-up MRI may be considered. Electronically Signed   By: Ernie Avena M.D.   On: 10/10/2022 14:37    Assessment/Plan: Necrotic ulcer of left forearm  S/p left forearm excisional debridement with application of graft and wound vac with Dr. Lajoyce Corners 6/19 - will defer weightbearing and post-surgical recommendations to Dr. Lajoyce Corners - plan for repeat debridement tomorrow, NPO after midnight - continuing home methadone every morning  - started on broad spectrum vancomycin and unasyn 6/18, will consult ID for further antibiotic recommendations, so far surgical cultures show rare gram positive cocci in pairs - MRSA and staph positive on  pre-surgical nasal swab  Crohn's Disease - patient states she has Crohn's disease but does not follow with PCP or GI due to lack of insurance. Does not have diarrhea during flares but does have constipation for which she takes OTC products that usually will resolve her constipation.  - Multiple prn options on board. Will continue to watch. Has had one bowel movement since admission  Contact information:   Janine Ores, PA-C Weekdays 8-5  After hours and holidays please check [email or web address] for group call information for [facility]  Armida Sans 10/12/2022, 7:19 AM

## 2022-10-12 NOTE — Consult Note (Signed)
[facility]  Reason for Consult: Left forearm wound Referring Physician: Dr. Gale Journey Lauren is an 32 y.o. female.  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  She is status post multiple debridements in the past of the left hand in 2018.  She was admitted to the hospital on 10/10/2022 due to the left forearm wound and underwent debridement with Dr. Lajoyce Corners with orthopedics on 10/11/2022, wound VAC was placed along with Kerecis micro graft. She had necrotic tissue debrided from the wound bed, including skin, soft tissue muscle and fascia.  She is scheduled for additional debridement of left forearm tomorrow with Dr. Lajoyce Corners on 10/13/2022.  Patient is accompanied by her husband Romeo Apple and Son, Lindie Spruce at bedside.  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  She reports that she has not had the area treated in many years due to her job being very busy and lack of health insurance.  She reports that she is feeling better after surgery yesterday, reports initially she had some increased pain, however today she feels as if she is doing much better.  She is not having any infectious symptoms at this time.  Past Medical History:  Diagnosis Date   Anxiety    Crohn's disease (HCC)    Depression    Headache(784.0)    Ileitis    Opiate addiction (HCC)     Past Surgical History:  Procedure Laterality Date   COLONOSCOPY     HAND SURGERY Left    I & D EXTREMITY Left 11/06/2016   Procedure: IRRIGATION AND DEBRIDEMENT LEFT HAND;  Surgeon: Bradly Bienenstock, MD;  Location: [facility] OR;  Service: Orthopedics;  Laterality: Left;   I & D EXTREMITY Left 11/08/2016   Procedure: IRRIGATION AND DEBRIDEMENT LEFT HAND;  Surgeon: Bradly Bienenstock, MD;  Location: [facility] OR;  Service: Orthopedics;  Laterality: Left;   I & D EXTREMITY Left  10/11/2022   Procedure: IRRIGATION AND DEBRIDEMENT OF ARM;  Surgeon: Nadara Mustard, MD;  Location: [facility] OR;  Service: Orthopedics;  Laterality: Left;   TONSILLECTOMY      Family History  Problem Relation Age of Onset   Colon cancer Maternal Grandfather    Celiac disease Maternal Aunt    Diabetes Maternal Grandmother    Hypothyroidism Maternal Grandmother    Heart disease Unknown    Diabetes Mother    Hypertension Mother    Hypothyroidism Mother    Hypertension Father    Heart disease Father    Hypothyroidism Paternal Grandmother     Social History:  reports that she has been smoking cigarettes. She has been smoking an average of .1 packs per day. She has never used smokeless tobacco. She reports that she does not currently use alcohol. She reports that she does not currently use drugs.  Allergies:  Allergies  Allergen Reactions   Sulfa Antibiotics Palpitations    Medications: I have reviewed the patient's current medications.  Results for orders placed or performed during the hospital encounter of 10/10/22 (from the past 48 hour(s))  CBC with Differential     Status: Abnormal   Collection Time: 10/10/22  4:32 PM  Result Value Ref Range   WBC 13.5 (H) 4.0 - 10.5 K/uL   RBC 4.55 3.87 - 5.11 MIL/uL   Hemoglobin 8.7 (L) 12.0 - 15.0 g/dL  Comment: Reticulocyte Hemoglobin testing may be clinically indicated, consider ordering this additional test [id number]    HCT 30.4 (L) 36.0 - 46.0 %   MCV 66.8 (L) 80.0 - 100.0 fL   MCH 19.1 (L) 26.0 - 34.0 pg   MCHC 28.6 (L) 30.0 - 36.0 g/dL   RDW 30.8 (H) 65.7 - 84.6 %   Platelets 465 (H) 150 - 400 K/uL   nRBC 0.0 0.0 - 0.2 %   Neutrophils Relative % 80 %   Neutro Abs 10.8 (H) 1.7 - 7.7 K/uL   Lymphocytes Relative 13 %   Lymphs Abs 1.8 0.7 - 4.0 K/uL   Monocytes Relative 4 %   Monocytes Absolute 0.5 0.1 - 1.0 K/uL   Eosinophils Relative 1 %   Eosinophils Absolute 0.2 0.0 - 0.5 K/uL   Basophils Relative 0 %   Basophils Absolute  0.1 0.0 - 0.1 K/uL   Immature Granulocytes 2 %   Abs Immature Granulocytes 0.20 (H) 0.00 - 0.07 K/uL    Comment: Performed at [facility], [address], [place], [place] [address]  Comprehensive metabolic panel     Status: None   Collection Time: 10/10/22  4:32 PM  Result Value Ref Range   Sodium 136 135 - 145 mmol/L   Potassium 4.0 3.5 - 5.1 mmol/L   Chloride 99 98 - 111 mmol/L   CO2 30 22 - 32 mmol/L   Glucose, Bld 91 70 - 99 mg/dL    Comment: Glucose reference range applies only to samples taken after fasting for at least 8 hours.   BUN 12 6 - 20 mg/dL   Creatinine, Ser 2.84 0.44 - 1.00 mg/dL   Calcium 9.2 8.9 - 13.2 mg/dL   Total Protein 8.1 6.5 - 8.1 g/dL   Albumin 3.8 3.5 - 5.0 g/dL   AST 21 15 - 41 U/L   ALT 25 0 - 44 U/L   Alkaline Phosphatase 122 38 - 126 U/L   Total Bilirubin 0.3 0.3 - 1.2 mg/dL   GFR, Estimated >44 >01 mL/min    Comment: (NOTE) Calculated using the CKD-EPI Creatinine Equation (2021)    Anion gap 7 5 - 15    Comment: Performed at [facility], [address], [place], [place] [address]  Lactic acid, plasma     Status: None   Collection Time: 10/10/22  4:32 PM  Result Value Ref Range   Lactic Acid, Venous 0.9 0.5 - 1.9 mmol/L    Comment: Performed at [facility], [address] [place], [place] [address]  Blood culture (routine x 2)     Status: None (Preliminary result)   Collection Time: 10/10/22  4:32 PM   Specimen: BLOOD RIGHT FOREARM  Result Value Ref Range   Specimen Description      BLOOD RIGHT FOREARM Performed at [facility], [address] [place], [place] [address]    Special Requests      BOTTLES DRAWN AEROBIC AND ANAEROBIC Blood Culture adequate volume Performed at [facility], [address] [place], [place] [address]    Culture      NO GROWTH 2 DAYS Performed at [facility], [address] [place], [place] [address]    Report  Status PENDING   Blood culture (routine x 2)     Status: None (Preliminary result)   Collection Time: 10/10/22  4:34 PM   Specimen: BLOOD RIGHT ARM  Result Value Ref Range   Specimen Description  BLOOD RIGHT ARM Performed at [facility], [address] [place], [place] [address]    Special Requests      BOTTLES DRAWN AEROBIC ONLY Blood Culture results may not be optimal due to an inadequate volume of blood received in culture bottles Performed at [facility], [address] [place], [place] [address]    Culture      NO GROWTH 2 DAYS Performed at [facility], [address], [place], [place] [address]    Report Status PENDING   Surgical pcr screen     Status: Abnormal   Collection Time: 10/11/22 12:52 AM   Specimen: Nasal Mucosa; Nasal Swab  Result Value Ref Range   MRSA, PCR POSITIVE (A) NEGATIVE    Comment: RESULT CALLED TO, READ BACK BY AND VERIFIED WITH: RN BJYN [id number] @[id number]  BY SM    Staphylococcus aureus POSITIVE (A) NEGATIVE    Comment: (NOTE) The Xpert SA Assay (FDA approved for NASAL specimens in patients 83 years of age and older), is one component of a comprehensive surveillance program. It is not intended to diagnose infection nor to guide or monitor treatment. Performed at [facility], [address] [place], [place] [address]   HIV Antibody (routine testing w rflx)     Status: None   Collection Time: 10/11/22  2:09 AM  Result Value Ref Range   HIV Screen 4th Generation wRfx Non Reactive Non Reactive    Comment: Performed at [facility], [address], [place], [place] [address]  Type and screen [facility]     Status: None   Collection Time: 10/11/22 12:15 PM  Result Value Ref Range   ABO/RH(D) A POS    Antibody Screen NEG    Sample Expiration      10/14/2022,2359 Performed at [facility], [address], [place], [place] [address]   Pregnancy, urine POC     Status: None    Collection Time: 10/11/22 12:30 PM  Result Value Ref Range   Preg Test, Ur NEGATIVE NEGATIVE    Comment:        THE SENSITIVITY OF THIS METHODOLOGY IS >24 mIU/mL   Aerobic/Anaerobic Culture w Gram Stain (surgical/deep wound)     Status: None (Preliminary result)   Collection Time: 10/11/22  2:18 PM   Specimen: Soft Tissue, Other  Result Value Ref Range   Specimen Description ABSCESS LEFT ARM    Special Requests PT ON ANCEF    Gram Stain NO WBC SEEN RARE GRAM POSITIVE COCCI IN PAIRS     Culture      MODERATE STAPHYLOCOCCUS AUREUS MODERATE ENTEROCOCCUS FAECALIS CULTURE REINCUBATED FOR BETTER GROWTH Performed at [facility], [address], [place], [place] [address]    Report Status PENDING   Basic metabolic panel     Status: Abnormal   Collection Time: 10/12/22  7:50 AM  Result Value Ref Range   Sodium 137 135 - 145 mmol/L   Potassium 4.1 3.5 - 5.1 mmol/L   Chloride 94 (L) 98 - 111 mmol/L   CO2 28 22 - 32 mmol/L   Glucose, Bld 97 70 - 99 mg/dL    Comment: Glucose reference range applies only to samples taken after fasting for at least 8 hours.   BUN 7 6 - 20 mg/dL   Creatinine, Ser 2.44 0.44 - 1.00 mg/dL   Calcium 9.2 8.9 - 01.0 mg/dL   GFR, Estimated >27 >25 mL/min  Comment: (NOTE) Calculated using the CKD-EPI Creatinine Equation (2021)    Anion gap 15 5 - 15    Comment: Performed at [facility], [address], [place], [place] [address]  CBC     Status: Abnormal   Collection Time: 10/12/22  7:50 AM  Result Value Ref Range   WBC 12.4 (H) 4.0 - 10.5 K/uL   RBC 4.45 3.87 - 5.11 MIL/uL   Hemoglobin 8.6 (L) 12.0 - 15.0 g/dL    Comment: Reticulocyte Hemoglobin testing may be clinically indicated, consider ordering this additional test [id number]    HCT 29.8 (L) 36.0 - 46.0 %   MCV 67.0 (L) 80.0 - 100.0 fL   MCH 19.3 (L) 26.0 - 34.0 pg   MCHC 28.9 (L) 30.0 - 36.0 g/dL   RDW 11.9 (H) 14.7 - 82.9 %   Platelets 429 (H) 150 - 400 K/uL    Comment:  REPEATED TO VERIFY   nRBC 0.0 0.0 - 0.2 %    Comment: Performed at [facility], [address] [place], [place] [address]    No results found.  Review of Systems  Constitutional:  Negative for chills and fever.  Respiratory: Negative.    Cardiovascular: Negative.    Blood pressure (!) 129/91, pulse 82, temperature 98.6 F (37 C), temperature source Oral, resp. rate 20, height 5\' 4"  (1.626 m), weight 108.8 kg, SpO2 99 %. Physical Exam Constitutional:      General: She is not in acute distress.    Appearance: She is not ill-appearing, toxic-appearing or diaphoretic.     Comments: No acute distress, resting in bed, husband and son at bedside  HENT:     Head: Normocephalic and atraumatic.  Pulmonary:     Effort: Pulmonary effort is normal.  Skin:    General: Skin is warm and dry.          Comments: Left arm dressing in place, wound VAC noted with 150 cc of serosanguineous drainage in canister.  Palpable radial pulses noted bilaterally.  She is able to move all 5 digits of her left hand.  Distal sensation of left upper extremity intact.  Neurological:     General: No focal deficit present.     Mental Status: She is alert.  Psychiatric:        Mood and Affect: Mood normal.        Behavior: Behavior normal.     Assessment/Plan: Patient is a 32 year old female with left forearm wound present for many years.  Encouraged patient to discuss with case management her concerns related to lack of insurance  Plan to undergo debridement of left forearm wound, application of wound matrix, application of wound VAC with Dr. Ulice Bold next week on 10/19/2022.  All of her current questions related to planned surgical invention were answered to her content.  We discussed that we will follow-up next week to further discuss surgical planning and answer any additional questions she has after she discusses with her husband.  Current treatment plan per primary and Dr. Lajoyce Corners with  orthopedics.  Kermit Balo Evianna Chandran, PA-C 10/12/2022, 2:31 PM

## 2022-10-13 ENCOUNTER — Encounter (HOSPITAL_COMMUNITY)
Admission: EM | Disposition: A | Discharge status: Home / Self Care | Payer: Self-pay | Source: Home / Self Care | Attending: Orthopedic Surgery

## 2022-10-13 ENCOUNTER — Inpatient Hospital Stay (HOSPITAL_COMMUNITY): Payer: Self-pay | Admitting: Anesthesiology

## 2022-10-13 ENCOUNTER — Other Ambulatory Visit: Payer: Self-pay

## 2022-10-13 ENCOUNTER — Encounter (HOSPITAL_COMMUNITY): Payer: Self-pay | Admitting: Orthopedic Surgery

## 2022-10-13 DIAGNOSIS — D649 Anemia, unspecified: Secondary | ICD-10-CM

## 2022-10-13 DIAGNOSIS — L02414 Cutaneous abscess of left upper limb: Secondary | ICD-10-CM

## 2022-10-13 DIAGNOSIS — F418 Other specified anxiety disorders: Secondary | ICD-10-CM

## 2022-10-13 DIAGNOSIS — F17210 Nicotine dependence, cigarettes, uncomplicated: Secondary | ICD-10-CM

## 2022-10-13 HISTORY — PX: I & D EXTREMITY: SHX5045

## 2022-10-13 LAB — CBC
HCT: 28.9 % — ABNORMAL LOW (ref 36.0–46.0)
Hemoglobin: 8.2 g/dL — ABNORMAL LOW (ref 12.0–15.0)
MCH: 18.9 pg — ABNORMAL LOW (ref 26.0–34.0)
MCHC: 28.4 g/dL — ABNORMAL LOW (ref 30.0–36.0)
MCV: 66.4 fL — ABNORMAL LOW (ref 80.0–100.0)
Platelets: 399 10*3/uL (ref 150–400)
RBC: 4.35 MIL/uL (ref 3.87–5.11)
RDW: 19.4 % — ABNORMAL HIGH (ref 11.5–15.5)
WBC: 10.7 10*3/uL — ABNORMAL HIGH (ref 4.0–10.5)
nRBC: 0.2 % (ref 0.0–0.2)

## 2022-10-13 LAB — CULTURE, BLOOD (ROUTINE X 2): Special Requests: ADEQUATE

## 2022-10-13 LAB — BASIC METABOLIC PANEL
Anion gap: 17 — ABNORMAL HIGH (ref 5–15)
BUN: 8 mg/dL (ref 6–20)
CO2: 26 mmol/L (ref 22–32)
Calcium: 9 mg/dL (ref 8.9–10.3)
Chloride: 95 mmol/L — ABNORMAL LOW (ref 98–111)
Creatinine, Ser: 0.75 mg/dL (ref 0.44–1.00)
GFR, Estimated: >60 mL/min (ref >60)
Glucose, Bld: 98 mg/dL (ref 70–99)
Potassium: 3.7 mmol/L (ref 3.5–5.1)
Sodium: 138 mmol/L (ref 135–145)

## 2022-10-13 LAB — AEROBIC/ANAEROBIC CULTURE W GRAM STAIN (SURGICAL/DEEP WOUND)

## 2022-10-13 LAB — CK: Total CK: 40 U/L (ref 38–234)

## 2022-10-13 SURGERY — IRRIGATION AND DEBRIDEMENT EXTREMITY
Anesthesia: General | Laterality: Left

## 2022-10-13 MED ORDER — LIDOCAINE 2% (20 MG/ML) 5 ML SYRINGE
INTRAMUSCULAR | Status: DC | PRN
  Administered 2022-10-13: 40 mg via INTRAVENOUS

## 2022-10-13 MED ORDER — METHOCARBAMOL 500 MG PO TABS
500.0000 mg | ORAL_TABLET | Freq: Four times a day (QID) | ORAL | Status: DC | PRN
Start: 2022-10-13 — End: 2022-10-13

## 2022-10-13 MED ORDER — BISACODYL 10 MG RE SUPP
10.0000 mg | Freq: Every day | RECTAL | Status: DC | PRN
Start: 2022-10-13 — End: 2022-10-13

## 2022-10-13 MED ORDER — POLYETHYLENE GLYCOL 3350 17 G PO PACK: 17.0000 g | PACK | Freq: Every day | ORAL | Status: DC | PRN

## 2022-10-13 MED ORDER — CHLORHEXIDINE GLUCONATE 0.12 % MT SOLN
OROMUCOSAL | Status: AC
  Administered 2022-10-13: 15 mL
  Filled 2022-10-13: qty 15

## 2022-10-13 MED ORDER — HYDROMORPHONE HCL 1 MG/ML IJ SOLN
0.2500 mg | INTRAMUSCULAR | Status: DC | PRN
  Administered 2022-10-13 (×4): 0.5 mg via INTRAVENOUS

## 2022-10-13 MED ORDER — CEFAZOLIN SODIUM-DEXTROSE 2-4 GM/100ML-% IV SOLN: 2.0000 g | INTRAVENOUS | Status: DC

## 2022-10-13 MED ORDER — ONDANSETRON HCL 4 MG PO TABS: 4.0000 mg | ORAL_TABLET | Freq: Four times a day (QID) | ORAL | Status: DC | PRN

## 2022-10-13 MED ORDER — ONDANSETRON HCL 4 MG/2ML IJ SOLN
INTRAMUSCULAR | Status: DC | PRN
  Administered 2022-10-13: 4 mg via INTRAVENOUS

## 2022-10-13 MED ORDER — MIDAZOLAM HCL 2 MG/2ML IJ SOLN
INTRAMUSCULAR | Status: DC | PRN
  Administered 2022-10-13: 2 mg via INTRAVENOUS

## 2022-10-13 MED ORDER — 0.9 % SODIUM CHLORIDE (POUR BTL) OPTIME
TOPICAL | Status: DC | PRN
  Administered 2022-10-13: 1000 mL

## 2022-10-13 MED ORDER — MEPERIDINE HCL 25 MG/ML IJ SOLN: 6.2500 mg | INTRAMUSCULAR | Status: DC | PRN

## 2022-10-13 MED ORDER — HYDROMORPHONE HCL 1 MG/ML IJ SOLN
INTRAMUSCULAR | Status: AC
  Filled 2022-10-13: qty 1

## 2022-10-13 MED ORDER — CHLORHEXIDINE GLUCONATE 0.12 % MT SOLN
15.0000 mL | Freq: Once | OROMUCOSAL | Status: AC
  Filled 2022-10-13: qty 15

## 2022-10-13 MED ORDER — LIDOCAINE 2% (20 MG/ML) 5 ML SYRINGE
INTRAMUSCULAR | Status: AC
  Filled 2022-10-13: qty 5

## 2022-10-13 MED ORDER — GABAPENTIN 300 MG PO CAPS
300.0000 mg | ORAL_CAPSULE | Freq: Once | ORAL | Status: AC
  Administered 2022-10-13: 300 mg via ORAL
  Filled 2022-10-13: qty 1

## 2022-10-13 MED ORDER — ACETAMINOPHEN 500 MG PO TABS
1000.0000 mg | ORAL_TABLET | Freq: Once | ORAL | Status: AC
  Administered 2022-10-13: 1000 mg via ORAL
  Filled 2022-10-13: qty 2

## 2022-10-13 MED ORDER — LACTATED RINGERS IV SOLN: INTRAVENOUS | Status: DC | PRN

## 2022-10-13 MED ORDER — MAGNESIUM CITRATE PO SOLN: 1.0000 | Freq: Once | ORAL | Status: DC | PRN

## 2022-10-13 MED ORDER — POVIDONE-IODINE 10 % EX SWAB: 2.0000 | Freq: Once | CUTANEOUS | Status: DC

## 2022-10-13 MED ORDER — MIDAZOLAM HCL 2 MG/2ML IJ SOLN
INTRAMUSCULAR | Status: AC
  Filled 2022-10-13: qty 2

## 2022-10-13 MED ORDER — DEXTROSE 5 % IV SOLN
500.0000 mg | Freq: Four times a day (QID) | INTRAVENOUS | Status: DC | PRN
Start: 2022-10-13 — End: 2022-10-13

## 2022-10-13 MED ORDER — METOCLOPRAMIDE HCL 5 MG/ML IJ SOLN: 5.0000 mg | Freq: Three times a day (TID) | INTRAMUSCULAR | Status: DC | PRN

## 2022-10-13 MED ORDER — AMISULPRIDE (ANTIEMETIC) 5 MG/2ML IV SOLN: 10.0000 mg | Freq: Once | INTRAVENOUS | Status: DC | PRN

## 2022-10-13 MED ORDER — SODIUM CHLORIDE 0.9 % IV SOLN
500.0000 mg | Freq: Every day | INTRAVENOUS | Status: DC
  Administered 2022-10-13 – 2022-10-15 (×3): 500 mg via INTRAVENOUS
  Filled 2022-10-13 (×4): qty 10

## 2022-10-13 MED ORDER — LACTATED RINGERS IV SOLN: INTRAVENOUS | Status: DC

## 2022-10-13 MED ORDER — ORAL CARE MOUTH RINSE: 15.0000 mL | Freq: Once | OROMUCOSAL | Status: AC

## 2022-10-13 MED ORDER — METOCLOPRAMIDE HCL 5 MG PO TABS: 5.0000 mg | ORAL_TABLET | Freq: Three times a day (TID) | ORAL | Status: DC | PRN

## 2022-10-13 MED ORDER — PROPOFOL 10 MG/ML IV BOLUS
INTRAVENOUS | Status: AC
  Filled 2022-10-13: qty 20

## 2022-10-13 MED ORDER — ONDANSETRON HCL 4 MG/2ML IJ SOLN
4.0000 mg | Freq: Four times a day (QID) | INTRAMUSCULAR | Status: DC | PRN
  Administered 2022-10-15 – 2022-10-16 (×2): 4 mg via INTRAVENOUS
  Filled 2022-10-13 (×2): qty 2

## 2022-10-13 MED ORDER — FENTANYL CITRATE (PF) 250 MCG/5ML IJ SOLN
INTRAMUSCULAR | Status: AC
  Filled 2022-10-13: qty 5

## 2022-10-13 MED ORDER — VANCOMYCIN HCL 1500 MG/300ML IV SOLN: 1500.0000 mg | INTRAVENOUS | Status: DC

## 2022-10-13 MED ORDER — DEXAMETHASONE SODIUM PHOSPHATE 10 MG/ML IJ SOLN
INTRAMUSCULAR | Status: DC | PRN
  Administered 2022-10-13: 4 mg via INTRAVENOUS

## 2022-10-13 MED ORDER — CHLORHEXIDINE GLUCONATE 4 % EX SOLN: 60.0000 mL | Freq: Once | CUTANEOUS | Status: DC

## 2022-10-13 MED ORDER — PROMETHAZINE HCL 25 MG/ML IJ SOLN: 6.2500 mg | INTRAMUSCULAR | Status: DC | PRN

## 2022-10-13 MED ORDER — ONDANSETRON HCL 4 MG/2ML IJ SOLN
INTRAMUSCULAR | Status: AC
  Filled 2022-10-13: qty 2

## 2022-10-13 MED ORDER — DOCUSATE SODIUM 100 MG PO CAPS
100.0000 mg | ORAL_CAPSULE | Freq: Two times a day (BID) | ORAL | Status: DC
Start: 2022-10-13 — End: 2022-10-13

## 2022-10-13 MED ORDER — PROPOFOL 10 MG/ML IV BOLUS
INTRAVENOUS | Status: DC | PRN
  Administered 2022-10-13: 150 mg via INTRAVENOUS
  Administered 2022-10-13: 50 mg via INTRAVENOUS

## 2022-10-13 MED ORDER — FENTANYL CITRATE (PF) 250 MCG/5ML IJ SOLN
INTRAMUSCULAR | Status: DC | PRN
  Administered 2022-10-13: 50 ug via INTRAVENOUS
  Administered 2022-10-13: 100 ug via INTRAVENOUS
  Administered 2022-10-13: 50 ug via INTRAVENOUS

## 2022-10-13 MED ORDER — SODIUM CHLORIDE 0.9 % IV SOLN: INTRAVENOUS | Status: DC

## 2022-10-13 SURGICAL SUPPLY — 36 items
BAG COUNTER SPONGE SURGICOUNT (BAG) IMPLANT
BAG SPNG CNTER NS LX DISP (BAG)
BLADE SURG 21 STRL SS (BLADE) ×1 IMPLANT
BNDG CMPR 5X6 CHSV STRCH STRL (GAUZE/BANDAGES/DRESSINGS) ×1
BNDG COHESIVE 6X5 TAN ST LF (GAUZE/BANDAGES/DRESSINGS) IMPLANT
BNDG GAUZE DERMACEA FLUFF 4 (GAUZE/BANDAGES/DRESSINGS) ×2 IMPLANT
BNDG GZE DERMACEA 4 6PLY (GAUZE/BANDAGES/DRESSINGS)
CANISTER WOUND CARE 500ML ATS (WOUND CARE) IMPLANT
COVER SURGICAL LIGHT HANDLE (MISCELLANEOUS) ×2 IMPLANT
DRAPE U-SHAPE 47X51 STRL (DRAPES) ×1 IMPLANT
DRSG ADAPTIC 3X8 NADH LF (GAUZE/BANDAGES/DRESSINGS) ×1 IMPLANT
DURAPREP 26ML APPLICATOR (WOUND CARE) ×1 IMPLANT
ELECT REM PT RETURN 9FT ADLT (ELECTROSURGICAL)
ELECTRODE REM PT RTRN 9FT ADLT (ELECTROSURGICAL) IMPLANT
GAUZE SPONGE 4X4 12PLY STRL (GAUZE/BANDAGES/DRESSINGS) ×1 IMPLANT
GLOVE BIOGEL PI IND STRL 9 (GLOVE) ×1 IMPLANT
GLOVE SURG ORTHO 9.0 STRL STRW (GLOVE) ×1 IMPLANT
GOWN STRL REUS W/ TWL XL LVL3 (GOWN DISPOSABLE) ×2 IMPLANT
GOWN STRL REUS W/TWL XL LVL3 (GOWN DISPOSABLE) ×2
GRAFT SKIN WND SURGICLOSE M95 (Tissue) IMPLANT
HANDPIECE INTERPULSE COAX TIP (DISPOSABLE)
KIT BASIN OR (CUSTOM PROCEDURE TRAY) ×1 IMPLANT
KIT TURNOVER KIT B (KITS) ×1 IMPLANT
MANIFOLD NEPTUNE II (INSTRUMENTS) ×1 IMPLANT
NS IRRIG 1000ML POUR BTL (IV SOLUTION) ×1 IMPLANT
PACK ORTHO EXTREMITY (CUSTOM PROCEDURE TRAY) ×1 IMPLANT
PAD ARMBOARD 7.5X6 YLW CONV (MISCELLANEOUS) ×2 IMPLANT
PAD NEG PRESSURE SENSATRAC (MISCELLANEOUS) IMPLANT
SET HNDPC FAN SPRY TIP SCT (DISPOSABLE) IMPLANT
STOCKINETTE IMPERVIOUS 9X36 MD (GAUZE/BANDAGES/DRESSINGS) IMPLANT
SUT ETHILON 2 0 PSLX (SUTURE) ×1 IMPLANT
SWAB COLLECTION DEVICE MRSA (MISCELLANEOUS) ×1 IMPLANT
SWAB CULTURE ESWAB REG 1ML (MISCELLANEOUS) IMPLANT
TOWEL GREEN STERILE (TOWEL DISPOSABLE) ×1 IMPLANT
TUBE CONNECTING 12X1/4 (SUCTIONS) ×1 IMPLANT
YANKAUER SUCT BULB TIP NO VENT (SUCTIONS) ×1 IMPLANT

## 2022-10-13 NOTE — Progress Notes (Signed)
Pharmacy Antibiotic Note- follow-up  Lauren Allen is a 32 y.o. female admitted on 10/10/2022 with left forearm infection. Patient was transferred to [facility] with a complex proximal forearm with soft tissue necrosis involving skin, subcutaneous tissue, and superficial layers of musculature. Patient PMH includes IV drug use. Pharmacy has been consulted for Unasyn dosing; vancomycin changed to daptomycin.   WBC trending down, renal function is normal, and afebrile. Left arm abscess growing Staph aureus and Enterococcus faecalis, sensitivities pending. She is s/p excisional debridement 6/19 and returning to OR for debridement today.  Plan: Continue Unasyn 3 g IV q6h  Daptomycin per MD Monitor renal function, WBC, fever curve, culture data Follow up plans with orthopedic surgery/plastic surgery  Height: 5\' 4"  (162.6 cm) Weight: 108.8 kg (239 lb 13.8 oz) IBW/kg (Calculated) : 54.7  Temp (24hrs), Avg:98.3 F (36.8 C), Min:97.8 F (36.6 C), Max:98.6 F (37 C)  Recent Labs  Lab 10/10/22 1632 10/12/22 0750 10/13/22 0714  WBC 13.5* 12.4* 10.7*  CREATININE 0.59 0.72 0.75  LATICACIDVEN 0.9  --   --      Estimated Creatinine Clearance: 121.6 mL/min (by C-G formula based on SCr of 0.75 mg/dL).    Allergies  Allergen Reactions   Sulfa Antibiotics Palpitations    Antimicrobials this admission: Cefepime 6/18 >> x1 in ED Unasyn 6/18 >> Vanc 6/18 >> 6/21 Dapto 6/21 >>  Microbiology results: 6/19 MRSA PCR pos 6/18- Bcx- ngtd 6/19 L arm abscess: mod Staph aureus, mod Enterococcus faecalis  Thank you for involving pharmacy in this patient's care.  Loura Back, PharmD, BCPS Clinical Pharmacist Clinical phone for 10/13/2022 is [phone] 10/13/2022 10:44 AM

## 2022-10-13 NOTE — Anesthesia Postprocedure Evaluation (Signed)
Anesthesia Post Note  Patient: Lauren Allen  Procedure(s) Performed: DEBRIDEMENT LEFT FOREARM (Left)     Patient location during evaluation: PACU Anesthesia Type: General Level of consciousness: awake and alert, patient cooperative and oriented Pain control: pain improving. Respiratory status: nonlabored ventilation, spontaneous breathing and respiratory function stable Cardiovascular status: blood pressure returned to baseline and stable Postop Assessment: no apparent nausea or vomiting Anesthetic complications: no   No notable events documented.  Last Vitals:  Vitals:   10/13/22 0755 10/13/22 1245  BP: 115/70 (!) 141/77  Pulse: 66 75  Resp: 17 18  Temp: 37 C 36.8 C  SpO2: 100% 97%    Last Pain:  Vitals:   10/13/22 1257  TempSrc:   PainSc: 7                  [name],E. Roselee Tayloe

## 2022-10-13 NOTE — Progress Notes (Signed)
Occupational Therapy Treatment Patient Details Name: Lauren Allen MRN: [id number] DOB: [birth year] Today's Date: 10/13/2022   History of present illness 32 y.o. female admitted on 10/10/2022 with wound infection. Patient was transferred to [facility] with a complex proximal forearm with soft tissue necrosis involving skin, subcutaneous tissue, and superficial layers of musculature. She is s/p LUE debridement 10/11/22. PMH of Anxiety, Crohns, Depression Ileitis, opiate addiction   OT comments  Pt continuing to progress in OT sessions, LUE swelling visually appears to have slightly decreased in fingers, OT providing MEM at end of session to help reduce swelling. Continued education of LUE exercises to promote ROM and preserve LUE strength (see below). Educated and trialed pt on the body scan meditation for stress relief and worked on the use of fidgets, hobbies, and safewords during times of stress to help with relief of stress and anxiety. Pt verbalizing understanding of education with handouts provided for each. OT to continue to progress pt as able, will focus on more ROM and strengthening as tolerable. DC plans remain appropriate.    Recommendations for follow up therapy are one component of a multi-disciplinary discharge planning process, led by the attending physician.  Recommendations may be updated based on patient status, additional functional criteria and insurance authorization.    Assistance Recommended at Discharge PRN  Patient can return home with the following      Equipment Recommendations  None recommended by OT    Recommendations for Other Services      Precautions / Restrictions Restrictions Weight Bearing Restrictions: Yes LUE Weight Bearing: Weight bearing as tolerated       Mobility Bed Mobility Overal bed mobility: Modified Independent                  Transfers Overall transfer level: Modified independent Equipment used: None                      Balance Overall balance assessment: Mild deficits observed, not formally tested                                         ADL either performed or assessed with clinical judgement   ADL                                       Functional mobility during ADLs: Modified independent General ADL Comments: Focused today's session on therex and stess management strategies.    Extremity/Trunk Assessment              Vision       Perception     Praxis      Cognition Arousal/Alertness: Awake/alert Behavior During Therapy: WFL for tasks assessed/performed Overall Cognitive Status: Within Functional Limits for tasks assessed                                          Exercises General Exercises - Upper Extremity Elbow Flexion: AROM, Left, 5 reps, PROM, Seated Elbow Extension: Left, PROM, 5 reps, Seated Other Exercises Other Exercises: L wrist radial/ulnar deviation x10 reps each with thumb tucked Other Exercises: Table top wrist flexor stretch x5 reps 3 sec holds LUE. Other Exercises: Isometric wrist flex/ext x5 reps  each LUE    Shoulder Instructions       General Comments      Pertinent Vitals/ Pain       Pain Assessment Pain Assessment: Faces Faces Pain Scale: Hurts even more Pain Location: LUE with ROM Pain Descriptors / Indicators: Aching, Discomfort, Sore Pain Intervention(s): Monitored during session, Repositioned  Home Living                                          Prior Functioning/Environment              Frequency  Min 5X/week        Progress Toward Goals  OT Goals(current goals can now be found in the care plan section)  Progress towards OT goals: Progressing toward goals  Acute Rehab OT Goals Patient Stated Goal: to get better OT Goal Formulation: With patient Time For Goal Achievement: 11/02/22 Potential to Achieve Goals: Good  Plan Frequency remains  appropriate;Discharge plan remains appropriate    Co-evaluation                 AM-PAC OT "6 Clicks" Daily Activity     Outcome Measure   Help from another person eating meals?: None Help from another person taking care of personal grooming?: None Help from another person toileting, which includes using toliet, bedpan, or urinal?: None Help from another person bathing (including washing, rinsing, drying)?: None Help from another person to put on and taking off regular upper body clothing?: None Help from another person to put on and taking off regular lower body clothing?: None 6 Click Score: 24    End of Session    OT Visit Diagnosis: Pain;Muscle weakness (generalized) (M62.81) Pain - Right/Left: Left Pain - part of body: Arm   Activity Tolerance Patient tolerated treatment well   Patient Left in bed;with call bell/phone within reach;with family/visitor present   Nurse Communication Mobility status        Time: 1610-9604 OT Time Calculation (min): 30 min  Charges: OT General Charges $OT Visit: 1 Visit OT Treatments $Therapeutic Activity: 8-22 mins $Therapeutic Exercise: 8-22 mins  10/13/2022  AB, OTR/L  Acute Rehabilitation Services  Office: [phone]   Tristan Schroeder 10/13/2022, 5:02 PM

## 2022-10-13 NOTE — Transfer of Care (Signed)
Immediate Anesthesia Transfer of Care Note  Patient: Lauren Allen  Procedure(s) Performed: DEBRIDEMENT LEFT FOREARM (Left)  Patient Location: PACU  Anesthesia Type:General  Level of Consciousness: drowsy and patient cooperative  Airway & Oxygen Therapy: Patient Spontanous Breathing and Patient connected to face mask oxygen  Post-op Assessment: Report given to RN and Post -op Vital signs reviewed and stable  Post vital signs: Reviewed and stable  Last Vitals:  Vitals Value Taken Time  BP 106/68 10/13/22 1450  Temp    Pulse 55 10/13/22 1454  Resp 13 10/13/22 1454  SpO2 100 % 10/13/22 1454  Vitals shown include unvalidated device data.  Last Pain:  Vitals:   10/13/22 1257  TempSrc:   PainSc: 7       Patients Stated Pain Goal: 5 (10/13/22 6213)  Complications: No notable events documented.

## 2022-10-13 NOTE — Anesthesia Procedure Notes (Signed)
Procedure Name: LMA Insertion Date/Time: 10/13/2022 2:22 PM  Performed by: Audie Pinto, CRNAPre-anesthesia Checklist: Patient identified, Emergency Drugs available, Suction available and Patient being monitored Patient Re-evaluated:Patient Re-evaluated prior to induction Oxygen Delivery Method: Circle system utilized Preoxygenation: Pre-oxygenation with 100% oxygen Induction Type: IV induction LMA: LMA inserted LMA Size: 4.0 Placement Confirmation: positive ETCO2 Dental Injury: Teeth and Oropharynx as per pre-operative assessment

## 2022-10-13 NOTE — Interval H&P Note (Signed)
History and Physical Interval Note:  10/13/2022 6:27 AM  [name]  has presented today for surgery, with the diagnosis of Abscess Left Forearm.  The various methods of treatment have been discussed with the patient and family. After consideration of risks, benefits and other options for treatment, the patient has consented to  Procedure(s): DEBRIDEMENT LEFT FOREARM (Left) as a surgical intervention.  The patient's history has been reviewed, patient examined, no change in status, stable for surgery.  I have reviewed the patient's chart and labs.  Questions were answered to the patient's satisfaction.     Lauren Allen

## 2022-10-13 NOTE — Anesthesia Postprocedure Evaluation (Signed)
Anesthesia Post Note  Patient: Lauren Allen  Procedure(s) Performed: IRRIGATION AND DEBRIDEMENT OF ARM (Left: Arm Lower)     Patient location during evaluation: PACU Anesthesia Type: General Level of consciousness: awake and alert Pain management: pain level controlled Vital Signs Assessment: post-procedure vital signs reviewed and stable Respiratory status: spontaneous breathing, nonlabored ventilation, respiratory function stable and patient connected to nasal cannula oxygen Cardiovascular status: blood pressure returned to baseline and stable Postop Assessment: no apparent nausea or vomiting Anesthetic complications: no   No notable events documented.  Last Vitals:  Vitals:   10/13/22 0542 10/13/22 0755  BP: 128/84 115/70  Pulse: 86 66  Resp: 20 17  Temp: 37 C 37 C  SpO2: 99% 100%    Last Pain:  Vitals:   10/13/22 0755  TempSrc: Oral  PainSc:                  Ariyan Brisendine S

## 2022-10-13 NOTE — Progress Notes (Signed)
Subjective: Day of Surgery s/p Procedure(s): IRRIGATION AND DEBRIDEMENT OF ARM   Patient is alert, oriented, seen in short stay.  Denies chest pain, SOB, Calf pain. No nausea/vomiting. No other complaints.    Objective:  PE: VITALS:   Vitals:   10/12/22 2029 10/13/22 0542 10/13/22 0755 10/13/22 1245  BP: 119/76 128/84 115/70 (!) 141/77  Pulse: 76 86 66 75  Resp: 20 20 17 18   Temp: 97.8 F (36.6 C) 98.6 F (37 C) 98.6 F (37 C) 98.2 F (36.8 C)  TempSrc: Oral Oral Oral Oral  SpO2: 98% 99% 100% 97%  Weight:    108.9 kg  Height:    5\' 4"  (1.626 m)   General: sitting up in bed, in no acute distress Resp: normal respiratory effort Cardio: normal rate and rhythm MSK: surgical dressing in place. 250 cc's of bloody drainge in canister of wound vac. Able to flex, extend, and abduct all fingers of left hand. Distal sensation intact.   LABS  Results for orders placed or performed during the hospital encounter of 10/10/22 (from the past 24 hour(s))  Basic metabolic panel     Status: Abnormal   Collection Time: 10/13/22  7:14 AM  Result Value Ref Range   Sodium 138 135 - 145 mmol/L   Potassium 3.7 3.5 - 5.1 mmol/L   Chloride 95 (L) 98 - 111 mmol/L   CO2 26 22 - 32 mmol/L   Glucose, Bld 98 70 - 99 mg/dL   BUN 8 6 - 20 mg/dL   Creatinine, Ser 1.61 0.44 - 1.00 mg/dL   Calcium 9.0 8.9 - 09.6 mg/dL   GFR, Estimated >04 >54 mL/min   Anion gap 17 (H) 5 - 15  CBC     Status: Abnormal   Collection Time: 10/13/22  7:14 AM  Result Value Ref Range   WBC 10.7 (H) 4.0 - 10.5 K/uL   RBC 4.35 3.87 - 5.11 MIL/uL   Hemoglobin 8.2 (L) 12.0 - 15.0 g/dL   HCT 09.8 (L) 11.9 - 14.7 %   MCV 66.4 (L) 80.0 - 100.0 fL   MCH 18.9 (L) 26.0 - 34.0 pg   MCHC 28.4 (L) 30.0 - 36.0 g/dL   RDW 82.9 (H) 56.2 - 13.0 %   Platelets 399 150 - 400 K/uL   nRBC 0.2 0.0 - 0.2 %  CK     Status: None   Collection Time: 10/13/22  7:14 AM  Result Value Ref Range   Total CK 40 38 - 234 U/L    No results  found.  Assessment/Plan: Necrotic ulcer of left forearm  S/p left forearm excisional debridement with application of graft and wound vac with Dr. Lajoyce Corners 6/19 - will defer weightbearing and post-surgical recommendations to Dr. Lajoyce Corners - repeat debridement today. Plastics also aware of patient, seen yesterday. - continuing home methadone every morning  - MRSA and staph positive on pre-surgical nasal swab. Surgical cultures from 6/19 grew staph aureus and enterococcus faecalis, waiting on susceptibilities - WBC trending down, 10.7 today - ID on board for antibiotic recommendations, patient will not be a candidate for home IV antibiotics due to history of IV drug use  Crohn's Disease - patient states she has Crohn's disease but does not follow with PCP or GI due to lack of insurance. Does not have diarrhea during flares but does have constipation for which she takes OTC products that usually will resolve her constipation.  - Multiple prn options on board. Will continue  to watch. Has had one bowel movement since admission  Contact information:   Janine Ores, PA-C Weekdays 8-5  After hours and holidays please check [email or web address] for group call information for [facility]  Armida Sans 10/13/2022, 2:12 PM

## 2022-10-13 NOTE — Progress Notes (Signed)
ID Brief Note  #Left forearm abscess status post debridement  - repeat debridement today, noted healthy viable granulation tissue -Staph aureus and E faecalis growing on cultures on 6/19 patient currently on Vanco and Unasyn.  Hopefully be able to give her a p.o. option, anticipate about 4 weeks of antibiotics from last OR 6/21  Dr. Elinor Parkinson will be covering this weekend.

## 2022-10-13 NOTE — Op Note (Signed)
10/13/2022  2:54 PM  PATIENT:  Lauren Allen    PRE-OPERATIVE DIAGNOSIS:  Abscess Left Forearm  POST-OPERATIVE DIAGNOSIS:  Same  PROCEDURE: Excisional DEBRIDEMENT LEFT FOREARM Application of Kerecis micro graft 95 cm. Application of cleanse choice wound VAC sponge x 1.  SURGEON:  Nadara Mustard, MD  PHYSICIAN ASSISTANT:None ANESTHESIA:   General  PREOPERATIVE INDICATIONS:  [name] is a  32 y.o. female with a diagnosis of Abscess Left Forearm who failed conservative measures and elected for surgical management.    The risks benefits and alternatives were discussed with the patient preoperatively including but not limited to the risks of infection, bleeding, nerve injury, cardiopulmonary complications, the need for revision surgery, among others, and the patient was willing to proceed.  OPERATIVE IMPLANTS:   Implant Name Type Inv. Item Serial No. Manufacturer Lot No. LRB No. Used Action  GRAFT SKIN WND SURGICLOSE M95 - [id number] Tissue GRAFT SKIN WND SURGICLOSE M95  [employer] [id number] Left 1 Implanted    @ENCIMAGES @  OPERATIVE FINDINGS: Patient had healthy viable granulation tissue.  OPERATIVE PROCEDURE: Patient was brought the operating room and underwent a general anesthetic.  After adequate levels anesthesia were obtained patient's left upper extremity was prepped using DuraPrep draped into a sterile field a timeout was called.  A rondure and 21 blade knife were used to excise skin and soft tissue muscle and fascia back to healthy bleeding viable granulation tissue.  The wound was irrigated normal saline electrocautery was used for hemostasis.  Kerecis micro graft 95 cm was applied to the wound bed.  This was covered with a cleanse choice sponge derma tack.  This had a good suction fit patient was extubated taken the PACU in stable condition   DISCHARGE PLANNING:  Antibiotic duration: Continue antibiotics for 24 hours  Weightbearing: Not applicable  Pain  medication: Can continue opioid pathway  Dressing care/ Wound VAC: Continue wound VAC will continue the VAC at discharge.  Ambulatory devices: Not applicable  Discharge to: Anticipate discharge to home.  Follow-up: In the office 1 week post operative.

## 2022-10-14 LAB — AEROBIC/ANAEROBIC CULTURE W GRAM STAIN (SURGICAL/DEEP WOUND): Gram Stain: NONE SEEN

## 2022-10-14 LAB — CULTURE, BLOOD (ROUTINE X 2)

## 2022-10-14 NOTE — Plan of Care (Signed)

## 2022-10-14 NOTE — Progress Notes (Signed)
Patient ID: Lauren Allen, female   DOB: 1990-11-15, 32 y.o.   MRN: [id number] Patient is status post repeat debridement large ulcer left forearm.  There is 100 cc in the wound VAC canister.  Patient may discharge to home on the portable Prevena wound VAC pump when her cultures are finalized and patient has antibiotics for discharge to home.  The dressing will need to be overwrapped with Coban or an Ace wrap at discharge.

## 2022-10-14 NOTE — Progress Notes (Signed)
Occupational Therapy Treatment Patient Details Name: Lauren Allen MRN: [id number] DOB: 1990-12-26 Today's Date: 10/14/2022   History of present illness 32 y.o. female admitted on 10/10/2022 with wound infection. Patient was transferred to [facility] with a complex proximal forearm with soft tissue necrosis involving skin, subcutaneous tissue, and superficial layers of musculature. She is s/p LUE debridement 10/11/22. PMH of Anxiety, Crohns, Depression Ileitis, opiate addiction   OT comments  Pt progressing toward established OT goals. Pt with fair recall of exercises educated on in previous sessions. Pt performing with min cues for technique and optimal positioning/ROM facilitation. Pt continues with decreased mobility at level of elbow and wrist (see below). Pt educated regarding edema management and demonstrating understanding. Will continue to follow and highly recommending OP OT follow up when cleared by MD.    Recommendations for follow up therapy are one component of a multi-disciplinary discharge planning process, led by the attending physician.  Recommendations may be updated based on patient status, additional functional criteria and insurance authorization.    Assistance Recommended at Discharge PRN  Patient can return home with the following      Equipment Recommendations  None recommended by OT    Recommendations for Other Services      Precautions / Restrictions Restrictions Weight Bearing Restrictions: Yes LUE Weight Bearing: Weight bearing as tolerated       Mobility Bed Mobility                    Transfers Overall transfer level: Modified independent Equipment used: None                     Balance                                           ADL either performed or assessed with clinical judgement   ADL                           Toilet Transfer: Modified Independent             General ADL Comments: focus  session on ther ex    Extremity/Trunk Assessment Upper Extremity Assessment Upper Extremity Assessment: LUE deficits/detail LUE Deficits / Details: pain with wrist ext/flex able to flex wrist ~10 degrees and extend ~40 degrees with self AAROM. Flex ~30 degrees with therapist facilitation. Weak gross grip strength. 20 degree elbow ext lag   Lower Extremity Assessment Lower Extremity Assessment: Overall WFL for tasks assessed        Vision       Perception     Praxis      Cognition Arousal/Alertness: Awake/alert Behavior During Therapy: WFL for tasks assessed/performed Overall Cognitive Status: Within Functional Limits for tasks assessed                                          Exercises Exercises: General Upper Extremity, Other exercises General Exercises - Upper Extremity Shoulder Flexion: AROM, Left, 5 reps Shoulder Extension: AROM, Right, 5 reps Shoulder ABduction: AROM, Left, 5 reps Shoulder ADduction: AROM, Left, 5 reps Shoulder Horizontal ABduction: AROM, Left, 5 reps Shoulder Horizontal ADduction: AROM, Left, 5 reps Elbow Flexion: AROM, Left, 10 reps Elbow Extension: AROM, Left, 10 reps Wrist  Flexion: AAROM, Left, 10 reps Wrist Extension: AAROM, Left, 10 reps Digit Composite Flexion: AROM, Left, 10 reps Composite Extension: AROM, Left, 10 reps Other Exercises Other Exercises: L wrist radial/ulnar deviation x10 reps each with thumb tucked Other Exercises: Table top wrist flexor stretch x5 reps 3 sec holds LUE. Other Exercises: Isometric wrist flex/ext x5 reps each LUE Other Exercises: LUE retrograde massage and education regarding elevation of operative extremity above heart to reduce edema Other Exercises: AROM pronation/supination 10x    Shoulder Instructions       General Comments up in room on arrival. Pt with concerns regarding finances and follow up therapy. Provided information for [employer] student run free OT clinic as well as messaged CM  regarding disability    Pertinent Vitals/ Pain       Pain Assessment Pain Assessment: Faces Faces Pain Scale: Hurts even more Pain Location: LUE with ROM Pain Descriptors / Indicators: Aching, Discomfort, Sore Pain Intervention(s): Limited activity within patient's tolerance, Monitored during session  Home Living                                          Prior Functioning/Environment              Frequency  Min 5X/week        Progress Toward Goals  OT Goals(current goals can now be found in the care plan section)  Progress towards OT goals: Progressing toward goals  Acute Rehab OT Goals Patient Stated Goal: get better OT Goal Formulation: With patient Time For Goal Achievement: 11/02/22 Potential to Achieve Goals: Good ADL Goals Pt Will Perform Grooming: Independently;standing Pt Will Perform Upper Body Bathing: Independently;sitting Pt/caregiver will Perform Home Exercise Program: Increased ROM;Increased strength;Independently;With written HEP provided Additional ADL Goal #1: Pt will independenlty demonstrate proper use of stress management techniques prn  Plan Frequency remains appropriate;Discharge plan remains appropriate    Co-evaluation                 AM-PAC OT "6 Clicks" Daily Activity     Outcome Measure   Help from another person eating meals?: None Help from another person taking care of personal grooming?: None Help from another person toileting, which includes using toliet, bedpan, or urinal?: None Help from another person bathing (including washing, rinsing, drying)?: None Help from another person to put on and taking off regular upper body clothing?: None Help from another person to put on and taking off regular lower body clothing?: None 6 Click Score: 24    End of Session    OT Visit Diagnosis: Pain;Muscle weakness (generalized) (M62.81) Pain - Right/Left: Left Pain - part of body: Arm   Activity Tolerance  Patient tolerated treatment well   Patient Left in bed;with call bell/phone within reach;with family/visitor present   Nurse Communication Mobility status        Time: 1240-1315 OT Time Calculation (min): 35 min  Charges: OT General Charges $OT Visit: 1 Visit OT Treatments $Therapeutic Exercise: 23-37 mins  Tyler Deis, OTR/L [facility] Acute Rehabilitation Office: [phone]   Myrla Halsted 10/14/2022, 2:25 PM

## 2022-10-14 NOTE — Progress Notes (Signed)
Physical Therapy Note  Spoke with occupational therapy as they have been working with Lauren Allen during her admission. They report patient is functioning at a high level of independence, ambulating safely throughout her room and no physical therapy is indicated at this time. PT is signing-off. Please re-order if there is any significant change in status. Thank you for this referral.  Kathlyn Sacramento, PT, DPT [facility] Services Physical Therapist Office: [phone] Website: [email or web address]

## 2022-10-14 NOTE — Progress Notes (Signed)
ORTHOPAEDIC PROGRESS NOTE  s/p Procedure(s): DEBRIDEMENT LEFT FOREARM  SUBJECTIVE: Reports moderate pain about operative site. She feels better than she has in a while over all.   No chest pain. No SOB. No nausea/vomiting. No other complaints.  OBJECTIVE: PE: General: sitting up in hospital bed, NAD LUE: surgical dressing in place. 75 cc's of serosanguineous drainge in canister of wound vac. Able to flex, extend, and abduct all fingers of left hand. Distal sensation intact.   Vitals:   10/14/22 0457 10/14/22 0834  BP: 100/65 (!) 149/104  Pulse: 60 71  Resp: 15 17  Temp: 98 F (36.7 C) 98.7 F (37.1 C)  SpO2: 100%      ASSESSMENT: Ambrosia Wisnewski is a 32 y.o. female   PLAN: - MRSA and staph positive on pre-surgical nasal swab. Surgical cultures from 6/19 grew staph aureus and enterococcus faecalis, waiting on susceptibilities - WBC trending down, 10.7 yesterday - ID on board for antibiotic recommendations, patient will not be a candidate for home IV antibiotics due to history of IV drug use  Weightbearing: WBAT Insicional and dressing care: Wound vac should remain in place Orthopedic device(s): Wound Vac:75 cc of serosanguineous drainage in canister Showering: hold for now VTE prophylaxis: SCDs. Ambulation Pain control: Methadone Follow - up plan: TBD  Contact information:  After hours and holidays please check [email or web address] for group call information for [facility]  Zeola Brys, PA-C 10/14/22

## 2022-10-15 ENCOUNTER — Encounter (HOSPITAL_COMMUNITY): Payer: Self-pay | Admitting: Orthopedic Surgery

## 2022-10-15 LAB — CULTURE, BLOOD (ROUTINE X 2): Culture: NO GROWTH

## 2022-10-15 NOTE — Progress Notes (Signed)
ORTHOPAEDIC PROGRESS NOTE  s/p Procedure(s): DEBRIDEMENT LEFT FOREARM  SUBJECTIVE: Reports moderate pain about operative site. She feels better than she has in a while over all. Has not gotten her morning meds yet.   No chest pain. No SOB. No nausea/vomiting. No other complaints.  OBJECTIVE: PE: General: sitting up in hospital bed, NAD LUE: wound vac in place. 150 cc's of serosanguineous drainge in canister of wound vac. Able to flex, extend, and abduct all fingers of left hand. Distal sensation intact.   Vitals:   10/14/22 2109 10/15/22 0625  BP: 120/66 120/84  Pulse: 72 62  Resp: 16 16  Temp: 99 F (37.2 C) 97.6 F (36.4 C)  SpO2: 100% 92%     ASSESSMENT: Lauren Allen is a 32 y.o. female   PLAN: - MRSA and staph positive on pre-surgical nasal swab.  - Surgical cultures from 6/19 grew staph aureus,enterococcus faecalis, bacteroides pyogenes  - ID on board for antibiotic recommendations, patient will not be a candidate for home IV antibiotics due to history of IV drug use  Per Dr. Audrie Lia note: "Patient may discharge to home on the portable Prevena wound VAC pump when her cultures are finalized and patient has antibiotics for discharge to home. The dressing will need to be overwrapped with Coban or an Ace wrap at discharge."  Will need finalized plan for antibiotic prior to discharge.   Weightbearing: WBAT Insicional and dressing care: Wound vac should remain in place Orthopedic device(s): Wound Vac:150 cc of serosanguineous drainage in canister Showering: hold for now VTE prophylaxis: SCDs. Ambulation Pain control: Methadone Follow - up plan: TBD  Contact information:  After hours and holidays please check [email or web address] for group call information for [facility]  Sherell Christoffel, PA-C 10/15/22

## 2022-10-15 NOTE — Progress Notes (Signed)
Patient informed me that she does not have any health insurance and she has anxiety. I shared with her that she can attempt to get affordable health care insurance so she can get her anxiety medication.

## 2022-10-16 ENCOUNTER — Other Ambulatory Visit (HOSPITAL_BASED_OUTPATIENT_CLINIC_OR_DEPARTMENT_OTHER): Payer: Self-pay

## 2022-10-16 ENCOUNTER — Other Ambulatory Visit (HOSPITAL_COMMUNITY): Payer: Self-pay

## 2022-10-16 MED ORDER — AMOXICILLIN-POT CLAVULANATE 875-125 MG PO TABS
1.0000 | ORAL_TABLET | Freq: Two times a day (BID) | ORAL | 0 refills | Status: DC
  Filled 2022-10-16 (×2): qty 20, 10d supply, fill #0

## 2022-10-16 MED ORDER — DOXYCYCLINE HYCLATE 100 MG PO TABS
100.0000 mg | ORAL_TABLET | Freq: Two times a day (BID) | ORAL | 0 refills | Status: DC
  Filled 2022-10-16 (×2): qty 20, 10d supply, fill #0

## 2022-10-16 MED ORDER — AMOXICILLIN-POT CLAVULANATE 875-125 MG PO TABS
1.0000 | ORAL_TABLET | Freq: Two times a day (BID) | ORAL | 0 refills | Status: DC
  Filled 2022-10-16: qty 20, 10d supply, fill #0

## 2022-10-16 MED ORDER — DOXYCYCLINE HYCLATE 100 MG PO TABS
100.0000 mg | ORAL_TABLET | Freq: Two times a day (BID) | ORAL | 0 refills | Status: DC
  Filled 2022-10-16: qty 20, 10d supply, fill #0

## 2022-10-16 MED ORDER — OXYCODONE HCL 10 MG PO TABS
10.0000 mg | ORAL_TABLET | Freq: Four times a day (QID) | ORAL | 0 refills | Status: DC
  Filled 2022-10-16: qty 30, 5d supply, fill #0

## 2022-10-16 MED ORDER — METHOCARBAMOL 750 MG PO TABS
750.0000 mg | ORAL_TABLET | Freq: Four times a day (QID) | ORAL | 0 refills | Status: DC | PRN
  Filled 2022-10-16: qty 30, 8d supply, fill #0

## 2022-10-16 NOTE — Progress Notes (Addendum)
Patient ID: Lauren Allen, female   DOB: August 14, 1990, 31 y.o.   MRN: [id number] Patient is status post repeat debridement left forearm.  Cultures are showing MRSA and Enterococcus.  The wound VAC dressing was rewrapped and this has a good suction fit.  Patient may discharge to home at this time I have placed orders for Augmentin and doxycycline.Marland Kitchen  Discharge with the Praveena plus portable wound VAC pump.  I will follow-up in the office in 1 week.

## 2022-10-16 NOTE — Discharge Summary (Signed)
Discharge Summary  Patient ID: Lauren Allen MRN: [id number] DOB/AGE: Feb 09, 1991 32 y.o.  Admit date: 10/10/2022 Discharge date: 10/16/2022  Admission Diagnoses:  Infection of forearm  Discharge Diagnoses:  Principal Problem:   Infection of forearm Active Problems:   Arm wound, left, initial encounter   Past Medical History:  Diagnosis Date   Anxiety    Crohn's disease (HCC)    Depression    Headache(784.0)    Ileitis    Opiate addiction (HCC)     Surgeries: Procedure(s): DEBRIDEMENT LEFT FOREARM on 10/13/2022   Consultants (if any): Treatment Team:  Teryl Lucy, MD Nadara Mustard, MD  Discharged Condition: Improved  Hospital Course: Lauren Allen is an 32 y.o. female who was admitted 10/10/2022 with a diagnosis of Infection of forearm and went to the operating room on 10/13/2022 and underwent the above named procedures.    She was given perioperative antibiotics:  Anti-infectives (From admission, onward)    Start     Dose/Rate Route Frequency Ordered Stop   10/16/22 0000  amoxicillin-clavulanate (AUGMENTIN) 875-125 MG tablet  Status:  Discontinued        1 tablet Oral 2 times daily 10/16/22 0757 10/16/22    10/16/22 0000  doxycycline (VIBRA-TABS) 100 MG tablet  Status:  Discontinued        100 mg Oral 2 times daily 10/16/22 0757 10/16/22    10/16/22 0000  amoxicillin-clavulanate (AUGMENTIN) 875-125 MG tablet        1 tablet Oral 2 times daily 10/16/22 0842     10/16/22 0000  doxycycline (VIBRA-TABS) 100 MG tablet        100 mg Oral 2 times daily 10/16/22 0842     10/14/22 0600  ceFAZolin (ANCEF) IVPB 2g/100 mL premix  Status:  Discontinued        2 g 200 mL/hr over 30 Minutes Intravenous On call to O.R. 10/13/22 1206 10/13/22 1208   10/14/22 0600  vancomycin (VANCOREADY) IVPB 1500 mg/300 mL  Status:  Discontinued        1,500 mg 150 mL/hr over 120 Minutes Intravenous On call to O.R. 10/13/22 1206 10/13/22 1210   10/13/22 1100  DAPTOmycin (CUBICIN) 500 mg  in sodium chloride 0.9 % IVPB  Status:  Discontinued        500 mg 120 mL/hr over 30 Minutes Intravenous Daily 10/13/22 1007 10/16/22 1607   10/12/22 0600  ceFAZolin (ANCEF) IVPB 2g/100 mL premix        2 g 200 mL/hr over 30 Minutes Intravenous On call to O.R. 10/11/22 1145 10/11/22 1405   10/11/22 1200  vancomycin (VANCOCIN) IVPB 1000 mg/200 mL premix  Status:  Discontinued        1,000 mg 200 mL/hr over 60 Minutes Intravenous Every 12 hours 10/10/22 2223 10/11/22 0815   10/11/22 1200  vancomycin (VANCOREADY) IVPB 1250 mg/250 mL  Status:  Discontinued        1,250 mg 166.7 mL/hr over 90 Minutes Intravenous Every 12 hours 10/11/22 0815 10/13/22 1007   10/11/22 1147  ceFAZolin (ANCEF) 2-4 GM/100ML-% IVPB       Note to Pharmacy: [facility] [place]: cabinet override      10/11/22 1147 10/11/22 1412   10/11/22 0600  Ampicillin-Sulbactam (UNASYN) 3 g in sodium chloride 0.9 % 100 mL IVPB  Status:  Discontinued        3 g 200 mL/hr over 30 Minutes Intravenous Every 6 hours 10/10/22 2222 10/16/22 1607   10/10/22 2315  vancomycin (VANCOREADY) IVPB 2000 mg/400 mL  2,000 mg 200 mL/hr over 120 Minutes Intravenous  Once 10/10/22 2222 10/11/22 0154   10/10/22 2200  vancomycin (VANCOCIN) IVPB 1000 mg/200 mL premix  Status:  Discontinued       See Hyperspace for full Linked Orders Report.   1,000 mg 200 mL/hr over 60 Minutes Intravenous  Once 10/10/22 2041 10/10/22 2222   10/10/22 2100  vancomycin (VANCOCIN) IVPB 1000 mg/200 mL premix  Status:  Discontinued       See Hyperspace for full Linked Orders Report.   1,000 mg 200 mL/hr over 60 Minutes Intravenous  Once 10/10/22 2041 10/10/22 2222   10/10/22 2045  vancomycin (VANCOREADY) IVPB 2000 mg/400 mL  Status:  Discontinued        2,000 mg 200 mL/hr over 120 Minutes Intravenous  Once 10/10/22 2040 10/10/22 2040   10/10/22 2045  Ampicillin-Sulbactam (UNASYN) 3 g in sodium chloride 0.9 % 100 mL IVPB  Status:  Discontinued        3 g 200 mL/hr over 30  Minutes Intravenous Every 6 hours 10/10/22 2040 10/10/22 2222   10/10/22 1745  vancomycin (VANCOCIN) IVPB 1000 mg/200 mL premix        1,000 mg 200 mL/hr over 60 Minutes Intravenous  Once 10/10/22 1735 10/10/22 2034   10/10/22 1745  ceFEPIme (MAXIPIME) 2 g in sodium chloride 0.9 % 100 mL IVPB        2 g 200 mL/hr over 30 Minutes Intravenous  Once 10/10/22 1735 10/10/22 2034     .  She was given sequential compression devices, early ambulation for DVT prophylaxis.  She benefited maximally from the hospital stay and there were no complications.    Recent vital signs:  Vitals:   10/16/22 0424 10/16/22 0718  BP: (!) 142/39 120/89  Pulse: 72 79  Resp: 17   Temp: 98.1 F (36.7 C) 98.7 F (37.1 C)  SpO2: 99% 98%    Recent laboratory studies:  Lab Results  Component Value Date   HGB 8.2 (L) 10/13/2022   HGB 8.6 (L) 10/12/2022   HGB 8.7 (L) 10/10/2022   Lab Results  Component Value Date   WBC 10.7 (H) 10/13/2022   PLT 399 10/13/2022   No results found for: "INR" Lab Results  Component Value Date   NA 138 10/13/2022   K 3.7 10/13/2022   CL 95 (L) 10/13/2022   CO2 26 10/13/2022   BUN 8 10/13/2022   CREATININE 0.75 10/13/2022   GLUCOSE 98 10/13/2022    Discharge Medications:   Allergies as of 10/16/2022       Reactions   Sulfa Antibiotics Palpitations        Medication List     STOP taking these medications    nitrofurantoin (macrocrystal-monohydrate) 100 MG capsule Commonly known as: MACROBID       TAKE these medications    amoxicillin-clavulanate 875-125 MG tablet Commonly known as: AUGMENTIN Take 1 tablet by mouth 2 (two) times daily.   doxycycline 100 MG tablet Commonly known as: VIBRA-TABS Take 1 tablet (100 mg total) by mouth 2 (two) times daily.   Gentle Laxative 10 MG suppository Generic drug: bisacodyl Place 1 suppository (10 mg total) rectally as needed for moderate constipation.   methadone 10 MG/ML solution Commonly known as:  DOLOPHINE Take 145 mg by mouth daily.   methocarbamol 750 MG tablet Commonly known as: Robaxin-750 Take 1 tablet (750 mg total) by mouth every 6 (six) hours as needed for muscle spasms.   omeprazole 40 MG capsule  Commonly known as: PRILOSEC Take 1 capsule (40 mg total) by mouth 2 (two) times daily.   Oxycodone HCl 10 MG Tabs Take 1-1.5 tablets (10-15 mg total) by mouth every 6 (six) hours.   polyethylene glycol-electrolytes 420 g solution Commonly known as: NuLYTELY Take 100 ml every 1-2 hours until constipation relieved        Diagnostic Studies: DG Forearm Left  Result Date: 10/10/2022 CLINICAL DATA:  Open skin wound in left forearm EXAM: LEFT FOREARM - 2 VIEW COMPARISON:  None Available. FINDINGS: No fracture or dislocation is seen. No focal lytic lesions are seen. Large areas of soft tissue defect are noted in the dorsal aspect of proximal forearm and lateral aspect of mid and distal forearm. There are no opaque foreign bodies. IMPRESSION: No fracture or dislocation is seen. No focal lytic lesions are seen. Large areas of open wound noted in the soft tissues. There are no opaque foreign bodies. If there is clinical suspicion for osteomyelitis, follow-up MRI may be considered. Electronically Signed   By: Ernie Avena M.D.   On: 10/10/2022 14:37    Disposition: Discharge disposition: 01-Home or Self Care       Discharge Instructions     Negative Pressure Wound Therapy - Incisional   Complete by: As directed    Attached the wound VAC dressing to the Praveena plus portable wound VAC pump for discharge.  Show patient how to plug this and to keep it charged.        Follow-up Information     [facility], [place] Follow up.   Why: Drug rehab, resume schedule at discharge Contact information: Address: [address] [place], [place], [place] [address] Phone: [phone]        Nadara Mustard, MD Follow up in 1 week(s).   Specialty: Orthopedic Surgery Contact  information: [address] [place] [address] [phone]                  Signed: Armida Sans PA-C 10/16/2022, 4:59 PM

## 2022-10-16 NOTE — Discharge Instructions (Signed)
Diet: As you were doing prior to hospitalization   Shower/Dressing:  May shower but keep the wounds dry, use an occlusive plastic wrap, NO SOAKING IN TUB.    Activity:  Increase activity slowly as tolerated, but follow the weight bearing instructions below.  The rules on driving is that you can not be taking narcotics while you drive, and you must feel in control of the vehicle.    Weight Bearing:   limit weight bearing to under 3 lbs with left arm  To prevent constipation: you may use a stool softener such as -  Colace (over the counter) 100 mg by mouth twice a day  Drink plenty of fluids (prune juice may be helpful) and high fiber foods Miralax (over the counter) for constipation as needed.    Itching:  If you experience itching with your medications, try taking only a single pain pill, or even half a pain pill at a time.  You may take up to 10 pain pills per day, and you can also use benadryl over the counter for itching or also to help with sleep.   Precautions:  If you experience chest pain or shortness of breath - call 911 immediately for transfer to the hospital emergency department!!  If you develop a fever greater that 101 F, purulent drainage from wound, increased redness or drainage from wound, or calf pain -- Call the office at [phone]                                                Follow- Up Appointment:  Please call for an appointment to be seen in 2 weeks [place] - [phone]

## 2022-10-16 NOTE — Discharge Planning (Signed)
Patient alert and oriented. Discharge teaching given. Prevena wound vac in place. IV access removed. TOC medications delivered to patient at bedside. Patient to be transported to lobby via volunteer services for private transportation home.

## 2022-10-16 NOTE — Progress Notes (Signed)
     Subjective: 3 Days Post-Op s/p Procedure(s): IRRIGATION AND DEBRIDEMENT OF ARM   Patient is alert, oriented, dressed and out of bed. Patient states she feels really good, much better than when she came to hospital. Ready to discharge home. No complaints.    Objective:  PE: VITALS:   Vitals:   10/15/22 1342 10/15/22 2016 10/16/22 0424 10/16/22 0718  BP: (!) 141/90 115/64 (!) 142/39 120/89  Pulse: 75 80 72 79  Resp: 17 17 17    Temp: 98.5 F (36.9 C) 99 F (37.2 C) 98.1 F (36.7 C) 98.7 F (37.1 C)  TempSrc: Oral Oral Oral Oral  SpO2: 100% 100% 99% 98%  Weight:      Height:       General: sitting up in bed, in no acute distress Resp: normal respiratory effort Cardio: normal rate and rhythm MSK: surgical dressing in place. 250 cc's of bloody drainge in canister of wound vac. Able to flex, extend, and abduct all fingers of left hand. Distal sensation intact.   LABS  No results found for this or any previous visit (from the past 24 hour(s)).   No results found.  Assessment/Plan: Necrotic ulcer of left forearm  S/p left forearm excisional debridement with application of graft and wound vac with Dr. Lajoyce Corners 6/19, 6/21 - cultures showing MRSA and enterococcus. Dr. Lajoyce Corners has ordered Augmentin and doxycycline for discharge.  - patient has been using 15 mg oxycodone multiple times a day along with baseline methadone. Will send home with pain med Rx and patient will return to methadone clinic - Patient will need to be transitioned to Prevena wound vac prior to discharge. I did not see one in her room, this will need to grabbed from the store room or OR and attached prior to patient's discharge.     Contact information:   Janine Ores, Cordelia Poche [id number]  After hours and holidays please check [email or web address] for group call information for [facility]  Armida Sans 10/16/2022, 8:49 AM

## 2022-10-17 ENCOUNTER — Ambulatory Visit: Payer: Self-pay

## 2022-10-17 NOTE — Telephone Encounter (Signed)
  Chief Complaint: Wound vac beeping Symptoms: above Frequency: now Pertinent Negatives: Patient denies  Disposition: [] ED /[] Urgent Care (no appt availability in office) / [] Appointment(In office/virtual)/ []  [facility]/ [] Home Care/ [] Refused Recommended Disposition /[] [facility]/ [x]  Follow-up with PCP Additional Notes: Wound vac was beeping after it fell. Wound vac now seems to be working fine and is not beeping. Pt will go back to ed if further troubleshooting is required.    Reason for Disposition  Health Information question, no triage required and triager able to answer question  Answer Assessment - Initial Assessment Questions 1. REASON FOR CALL or QUESTION: "What is your reason for calling today?" or "How can I best help you?" or "What question do you have that I can help answer?"     Wound vac was beeping.  Protocols used: Information Only Call - No Triage-A-AH

## 2022-10-18 ENCOUNTER — Encounter: Payer: Self-pay | Admitting: Family

## 2022-10-18 ENCOUNTER — Ambulatory Visit (INDEPENDENT_AMBULATORY_CARE_PROVIDER_SITE_OTHER): Payer: Self-pay | Admitting: Family

## 2022-10-18 DIAGNOSIS — S41102A Unspecified open wound of left upper arm, initial encounter: Secondary | ICD-10-CM

## 2022-10-18 NOTE — Progress Notes (Signed)
Post-Op Visit Note   Patient: Lauren Allen           Date of Birth: 1990/08/18           MRN: [id number] Visit Date: 10/18/2022 PCP: Pcp, No  Chief Complaint:  Chief Complaint  Patient presents with   Left Forearm - Routine Post Op    10/13/22 left forearm debridement    HPI:  HPI The patient is a 32 year old woman seen status post left forearm debridement with wound VAC placement wound VAC with a blockage today so was removed earlier in the visit. Ortho Exam On examination left forearm wound with good uptake of the graft material there is scant surrounding maceration no purulence no erythema no sign of infection  Visit Diagnoses: No diagnosis found.  Plan: Begin daily Dial soap cleansing dry dressings to the significant drainage.  She will follow-up in 1 week.  Consider Silvadene at that time.  Follow-Up Instructions: Return in about 2 weeks (around 11/01/2022).   Imaging: No results found.  Orders:  No orders of the defined types were placed in this encounter.  No orders of the defined types were placed in this encounter.    PMFS History: Patient Active Problem List   Diagnosis Date Noted   Arm wound, left, initial encounter 10/11/2022   Infection of forearm 10/10/2022   SVD (spontaneous vaginal delivery) 12/04/2016   Postpartum care following vaginal delivery 12/04/2016   Term pregnancy 12/03/2016   Opiate addiction (HCC) 11/06/2016   Abscess of left hand 11/06/2016   Pregnancy and infectious disease, third trimester 11/06/2016   Sepsis (HCC) 11/06/2016   Post-operative state 11/06/2016   Heroin abuse (HCC) 09/13/2016   ANXIETY 06/30/2009   DEPRESSION 06/30/2009   Past Medical History:  Diagnosis Date   Anxiety    Crohn's disease (HCC)    Depression    Headache(784.0)    Ileitis    Opiate addiction (HCC)     Family History  Problem Relation Age of Onset   Colon cancer Maternal Grandfather    Celiac disease Maternal Aunt    Diabetes Maternal  Grandmother    Hypothyroidism Maternal Grandmother    Heart disease Unknown    Diabetes Mother    Hypertension Mother    Hypothyroidism Mother    Hypertension Father    Heart disease Father    Hypothyroidism Paternal Grandmother     Past Surgical History:  Procedure Laterality Date   COLONOSCOPY     HAND SURGERY Left    I & D EXTREMITY Left 11/06/2016   Procedure: IRRIGATION AND DEBRIDEMENT LEFT HAND;  Surgeon: Bradly Bienenstock, MD;  Location: [facility] OR;  Service: Orthopedics;  Laterality: Left;   I & D EXTREMITY Left 11/08/2016   Procedure: IRRIGATION AND DEBRIDEMENT LEFT HAND;  Surgeon: Bradly Bienenstock, MD;  Location: [facility] OR;  Service: Orthopedics;  Laterality: Left;   I & D EXTREMITY Left 10/11/2022   Procedure: IRRIGATION AND DEBRIDEMENT OF ARM;  Surgeon: Nadara Mustard, MD;  Location: [facility] OR;  Service: Orthopedics;  Laterality: Left;   I & D EXTREMITY Left 10/13/2022   Procedure: DEBRIDEMENT LEFT FOREARM;  Surgeon: Nadara Mustard, MD;  Location: [facility] OR;  Service: Orthopedics;  Laterality: Left;   TONSILLECTOMY     Social History   Occupational History   Not on file  Tobacco Use   Smoking status: Some Days    Packs/day: .1    Types: Cigarettes    Last attempt to quit: 12/01/2016  Years since quitting: 5.8   Smokeless tobacco: Never  Vaping Use   Vaping Use: Some days  Substance and Sexual Activity   Alcohol use: Not Currently    Comment: occasional, not during pregnancy   Drug use: Not Currently    Comment: Heroine    Sexual activity: Not Currently    Birth control/protection: None

## 2022-10-19 ENCOUNTER — Ambulatory Visit: Admit: 2022-10-19 | Payer: Self-pay | Admitting: Plastic Surgery

## 2022-10-19 SURGERY — IRRIGATION AND DEBRIDEMENT WOUND
Anesthesia: Choice | Site: Arm Lower | Laterality: Left

## 2022-10-20 ENCOUNTER — Encounter: Payer: Self-pay | Admitting: Family

## 2022-10-25 ENCOUNTER — Encounter: Payer: Self-pay | Admitting: Family

## 2022-10-31 ENCOUNTER — Encounter: Payer: Self-pay | Admitting: Family

## 2022-10-31 ENCOUNTER — Ambulatory Visit (INDEPENDENT_AMBULATORY_CARE_PROVIDER_SITE_OTHER): Payer: Self-pay | Admitting: Family

## 2022-10-31 DIAGNOSIS — L98499 Non-pressure chronic ulcer of skin of other sites with unspecified severity: Secondary | ICD-10-CM

## 2022-10-31 DIAGNOSIS — L089 Local infection of the skin and subcutaneous tissue, unspecified: Secondary | ICD-10-CM

## 2022-10-31 NOTE — Progress Notes (Signed)
Post-Op Visit Note   Patient: Lauren Allen           Date of Birth: [birth year]           MRN: [id number] Visit Date: 10/31/2022 PCP: Pcp, No  Chief Complaint:  Chief Complaint  Patient presents with   Left Forearm - Routine Post Op    10/13/22 left forearm debridement    HPI:  HPI The patient is a 32 year old woman who presents status post left arm debridement with Kerecis placement June 21 she has been doing daily Dial soap cleansing dry dressings with Ace wrap's has completed her course of antibiotics  Ortho Exam On examination of the left arm there is great granulation in the wound bed scant fibrinous tissue no epiboly of the wound edges no erythema no drainage no sign of infection     Visit Diagnoses: No diagnosis found.  Plan: Will begin using the vive compression sleeves with direct skin contact may pad over this to "collect drainage given 3 sleeves today she will follow-up in 2 weeks with Dr. Lajoyce Corners.  Consider Kerecis at that time.  Follow-Up Instructions: Return in about 2 weeks (around 11/14/2022).   Imaging: No results found.  Orders:  No orders of the defined types were placed in this encounter.  No orders of the defined types were placed in this encounter.    PMFS History: Patient Active Problem List   Diagnosis Date Noted   Arm wound, left, initial encounter 10/11/2022   Infection of forearm 10/10/2022   SVD (spontaneous vaginal delivery) 12/04/2016   Postpartum care following vaginal delivery 12/04/2016   Term pregnancy 12/03/2016   Opiate addiction (HCC) 11/06/2016   Abscess of left hand 11/06/2016   Pregnancy and infectious disease, third trimester 11/06/2016   Sepsis (HCC) 11/06/2016   Post-operative state 11/06/2016   Heroin abuse (HCC) 09/13/2016   ANXIETY 06/30/2009   DEPRESSION 06/30/2009   Past Medical History:  Diagnosis Date   Anxiety    Crohn's disease (HCC)    Depression    Headache(784.0)    Ileitis    Opiate addiction  (HCC)     Family History  Problem Relation Age of Onset   Colon cancer Maternal Grandfather    Celiac disease Maternal Aunt    Diabetes Maternal Grandmother    Hypothyroidism Maternal Grandmother    Heart disease Unknown    Diabetes Mother    Hypertension Mother    Hypothyroidism Mother    Hypertension Father    Heart disease Father    Hypothyroidism Paternal Grandmother     Past Surgical History:  Procedure Laterality Date   COLONOSCOPY     HAND SURGERY Left    I & D EXTREMITY Left 11/06/2016   Procedure: IRRIGATION AND DEBRIDEMENT LEFT HAND;  Surgeon: Bradly Bienenstock, MD;  Location: [facility] OR;  Service: Orthopedics;  Laterality: Left;   I & D EXTREMITY Left 11/08/2016   Procedure: IRRIGATION AND DEBRIDEMENT LEFT HAND;  Surgeon: Bradly Bienenstock, MD;  Location: [facility] OR;  Service: Orthopedics;  Laterality: Left;   I & D EXTREMITY Left 10/11/2022   Procedure: IRRIGATION AND DEBRIDEMENT OF ARM;  Surgeon: Nadara Mustard, MD;  Location: [facility] OR;  Service: Orthopedics;  Laterality: Left;   I & D EXTREMITY Left 10/13/2022   Procedure: DEBRIDEMENT LEFT FOREARM;  Surgeon: Nadara Mustard, MD;  Location: [facility] OR;  Service: Orthopedics;  Laterality: Left;   TONSILLECTOMY     Social History   Occupational History   Not  on file  Tobacco Use   Smoking status: Some Days    Packs/day: .1    Types: Cigarettes    Last attempt to quit: 12/01/2016    Years since quitting: 5.9   Smokeless tobacco: Never  Vaping Use   Vaping Use: Some days  Substance and Sexual Activity   Alcohol use: Not Currently    Comment: occasional, not during pregnancy   Drug use: Not Currently    Comment: Heroine    Sexual activity: Not Currently    Birth control/protection: None

## 2022-11-14 ENCOUNTER — Ambulatory Visit (INDEPENDENT_AMBULATORY_CARE_PROVIDER_SITE_OTHER): Payer: Self-pay | Admitting: Orthopedic Surgery

## 2022-11-14 DIAGNOSIS — L089 Local infection of the skin and subcutaneous tissue, unspecified: Secondary | ICD-10-CM

## 2022-11-14 DIAGNOSIS — S41102A Unspecified open wound of left upper arm, initial encounter: Secondary | ICD-10-CM

## 2022-11-15 ENCOUNTER — Encounter: Payer: Self-pay | Admitting: Orthopedic Surgery

## 2022-11-15 NOTE — Progress Notes (Signed)
Office Visit Note   Patient: Lauren Allen           Date of Birth: [birth year]           MRN: [id number] Visit Date: 11/14/2022              Requested by: No referring provider defined for this encounter. PCP: Pcp, No  Chief Complaint  Patient presents with   Left Forearm - Routine Post Op    10/13/22 left forearm debridement      HPI: Patient is a 32 year old woman status post debridement large necrotic wound left forearm she is 4 weeks out from surgery currently wearing a compression sleeve.  Assessment & Plan: Visit Diagnoses:  1. Infection of forearm   2. Arm wound, left, initial encounter     Plan: Kerecis donated sheet was applied continue with compression.  Follow-Up Instructions: Return in about 2 weeks (around 11/28/2022).   Ortho Exam  Patient is alert, oriented, no adenopathy, well-dressed, normal affect, normal respiratory effort. Examination the wound bed is flat with healthy granulation tissue.  The wound measures 15 x 9 cm.  The wound was debrided and a donated Kerecis sheet was applied.  This was covered with a sterile compression dressing.  The wound healing has stalled, the wound bed has healthy granulation tissue, and patient presents for evaluation and application of Kerecis MariGen Micro graft. After informed consent a 10 blade knife was used to debride the skin and soft tissue to healthy viable bleeding granulation tissue.  Silver nitrate was used for hemostasis. The wound measures: 15 cm in length, 9cm  in width, 1 mm in depth, wound location left forearm Kerecis MariGen micro tissue graft 3 x 5 cm2 was applied, and there was no wastage.  Please see the photo below of the Lot number and expiration date. The micro tissue graft was covered with a nonadherent Adaptic dressing, bolstered with 4 x 4 gauze and secured with a compression wrap.     Imaging: No results found.   Labs: Lab Results  Component Value Date   HGBA1C 5.4 10/24/2017    ESRSEDRATE 37 (H) 01/04/2018   ESRSEDRATE 12 05/19/2013   ESRSEDRATE 24 (H) 10/17/2011   CRP 1.2 01/04/2018   CRP 0.8 05/19/2013   CRP 3 10/17/2011   REPTSTATUS 10/14/2022 FINAL 10/11/2022   GRAMSTAIN NO WBC SEEN RARE GRAM POSITIVE COCCI IN PAIRS  10/11/2022   CULT  10/11/2022    MODERATE METHICILLIN RESISTANT STAPHYLOCOCCUS AUREUS MODERATE ENTEROCOCCUS FAECALIS MODERATE BACTEROIDES PYOGENES BETA LACTAMASE NEGATIVE Performed at [facility], [address] [place], [place] [address]    LABORGA ENTEROCOCCUS FAECALIS 10/11/2022   LABORGA METHICILLIN RESISTANT STAPHYLOCOCCUS AUREUS 10/11/2022     Lab Results  Component Value Date   ALBUMIN 3.8 10/10/2022   ALBUMIN 4.3 01/04/2018   ALBUMIN 4.1 08/01/2017    No results found for: "MG" No results found for: "VD25OH"  No results found for: "PREALBUMIN"    Latest Ref Rng & Units 10/13/2022    7:14 AM 10/12/2022    7:50 AM 10/10/2022    4:32 PM  CBC EXTENDED  WBC 4.0 - 10.5 K/uL 10.7  12.4  13.5   RBC 3.87 - 5.11 MIL/uL 4.35  4.45  4.55   Hemoglobin 12.0 - 15.0 g/dL 8.2  8.6  8.7   HCT 19.1 - 46.0 % 28.9  29.8  30.4   Platelets 150 - 400 K/uL 399  429  465   NEUT# 1.7 -  7.7 K/uL   10.8   Lymph# 0.7 - 4.0 K/uL   1.8      There is no height or weight on file to calculate BMI.  Orders:  No orders of the defined types were placed in this encounter.  No orders of the defined types were placed in this encounter.    Procedures: No procedures performed  Clinical Data: No additional findings.  ROS:  All other systems negative, except as noted in the HPI. Review of Systems  Objective: Vital Signs: There were no vitals taken for this visit.  Specialty Comments:  No specialty comments available.  PMFS History: Patient Active Problem List   Diagnosis Date Noted   Arm wound, left, initial encounter 10/11/2022   Infection of forearm 10/10/2022   SVD (spontaneous vaginal delivery) 12/04/2016   Postpartum  care following vaginal delivery 12/04/2016   Term pregnancy 12/03/2016   Opiate addiction (HCC) 11/06/2016   Abscess of left hand 11/06/2016   Pregnancy and infectious disease, third trimester 11/06/2016   Sepsis (HCC) 11/06/2016   Post-operative state 11/06/2016   Heroin abuse (HCC) 09/13/2016   ANXIETY 06/30/2009   DEPRESSION 06/30/2009   Past Medical History:  Diagnosis Date   Anxiety    Crohn's disease (HCC)    Depression    Headache(784.0)    Ileitis    Opiate addiction (HCC)     Family History  Problem Relation Age of Onset   Colon cancer Maternal Grandfather    Celiac disease Maternal Aunt    Diabetes Maternal Grandmother    Hypothyroidism Maternal Grandmother    Heart disease Unknown    Diabetes Mother    Hypertension Mother    Hypothyroidism Mother    Hypertension Father    Heart disease Father    Hypothyroidism Paternal Grandmother     Past Surgical History:  Procedure Laterality Date   COLONOSCOPY     HAND SURGERY Left    I & D EXTREMITY Left 11/06/2016   Procedure: IRRIGATION AND DEBRIDEMENT LEFT HAND;  Surgeon: Bradly Bienenstock, MD;  Location: [facility] OR;  Service: Orthopedics;  Laterality: Left;   I & D EXTREMITY Left 11/08/2016   Procedure: IRRIGATION AND DEBRIDEMENT LEFT HAND;  Surgeon: Bradly Bienenstock, MD;  Location: [facility] OR;  Service: Orthopedics;  Laterality: Left;   I & D EXTREMITY Left 10/11/2022   Procedure: IRRIGATION AND DEBRIDEMENT OF ARM;  Surgeon: Nadara Mustard, MD;  Location: [facility] OR;  Service: Orthopedics;  Laterality: Left;   I & D EXTREMITY Left 10/13/2022   Procedure: DEBRIDEMENT LEFT FOREARM;  Surgeon: Nadara Mustard, MD;  Location: [facility] OR;  Service: Orthopedics;  Laterality: Left;   TONSILLECTOMY     Social History   Occupational History   Not on file  Tobacco Use   Smoking status: Some Days    Current packs/day: 0.00    Types: Cigarettes    Last attempt to quit: 12/01/2016    Years since quitting: 5.9   Smokeless tobacco: Never  Vaping Use    Vaping status: Some Days  Substance and Sexual Activity   Alcohol use: Not Currently    Comment: occasional, not during pregnancy   Drug use: Not Currently    Comment: Heroine    Sexual activity: Not Currently    Birth control/protection: None

## 2022-11-28 ENCOUNTER — Ambulatory Visit (INDEPENDENT_AMBULATORY_CARE_PROVIDER_SITE_OTHER): Payer: Self-pay | Admitting: Orthopedic Surgery

## 2022-11-28 DIAGNOSIS — S41102A Unspecified open wound of left upper arm, initial encounter: Secondary | ICD-10-CM

## 2022-11-30 ENCOUNTER — Encounter: Payer: Self-pay | Admitting: Orthopedic Surgery

## 2022-11-30 NOTE — Progress Notes (Signed)
Office Visit Note   Patient: Lauren Allen           Date of Birth: Mar 03, 1991           MRN: [id number] Visit Date: 11/28/2022              Requested by: No referring provider defined for this encounter. PCP: Pcp, No  Chief Complaint  Patient presents with   Left Forearm - Routine Post Op     10/13/22 left forearm debridement        HPI: Patient is a 32 year old woman status post debridement left forearm wound approximately 6 weeks ago.  Assessment & Plan: Visit Diagnoses:  1. Arm wound, left, initial encounter     Plan: We will apply a compression sleeve wash daily with soap and water follow-up in 4 weeks  Follow-Up Instructions: Return in about 4 weeks (around 12/26/2022).   Ortho Exam  Patient is alert, oriented, no adenopathy, well-dressed, normal affect, normal respiratory effort. Examination of the forearm wound has healthy granulation tissue it measures 5 x 11 cm.  Imaging: No results found. No images are attached to the encounter.  Labs: Lab Results  Component Value Date   HGBA1C 5.4 10/24/2017   ESRSEDRATE 37 (H) 01/04/2018   ESRSEDRATE 12 05/19/2013   ESRSEDRATE 24 (H) 10/17/2011   CRP 1.2 01/04/2018   CRP 0.8 05/19/2013   CRP 3 10/17/2011   REPTSTATUS 10/14/2022 FINAL 10/11/2022   GRAMSTAIN NO WBC SEEN RARE GRAM POSITIVE COCCI IN PAIRS  10/11/2022   CULT  10/11/2022    MODERATE METHICILLIN RESISTANT STAPHYLOCOCCUS AUREUS MODERATE ENTEROCOCCUS FAECALIS MODERATE BACTEROIDES PYOGENES BETA LACTAMASE NEGATIVE Performed at [facility], [address], [place], [place] [address]    LABORGA ENTEROCOCCUS FAECALIS 10/11/2022   LABORGA METHICILLIN RESISTANT STAPHYLOCOCCUS AUREUS 10/11/2022     Lab Results  Component Value Date   ALBUMIN 3.8 10/10/2022   ALBUMIN 4.3 01/04/2018   ALBUMIN 4.1 08/01/2017    No results found for: "MG" No results found for: "VD25OH"  No results found for: "PREALBUMIN"    Latest Ref Rng & Units  10/13/2022    7:14 AM 10/12/2022    7:50 AM 10/10/2022    4:32 PM  CBC EXTENDED  WBC 4.0 - 10.5 K/uL 10.7  12.4  13.5   RBC 3.87 - 5.11 MIL/uL 4.35  4.45  4.55   Hemoglobin 12.0 - 15.0 g/dL 8.2  8.6  8.7   HCT 19.1 - 46.0 % 28.9  29.8  30.4   Platelets 150 - 400 K/uL 399  429  465   NEUT# 1.7 - 7.7 K/uL   10.8   Lymph# 0.7 - 4.0 K/uL   1.8      There is no height or weight on file to calculate BMI.  Orders:  No orders of the defined types were placed in this encounter.  No orders of the defined types were placed in this encounter.    Procedures: No procedures performed  Clinical Data: No additional findings.  ROS:  All other systems negative, except as noted in the HPI. Review of Systems  Objective: Vital Signs: There were no vitals taken for this visit.  Specialty Comments:  No specialty comments available.  PMFS History: Patient Active Problem List   Diagnosis Date Noted   Arm wound, left, initial encounter 10/11/2022   Infection of forearm 10/10/2022   SVD (spontaneous vaginal delivery) 12/04/2016   Postpartum care following vaginal delivery 12/04/2016   Term  pregnancy 12/03/2016   Opiate addiction (HCC) 11/06/2016   Abscess of left hand 11/06/2016   Pregnancy and infectious disease, third trimester 11/06/2016   Sepsis (HCC) 11/06/2016   Post-operative state 11/06/2016   Heroin abuse (HCC) 09/13/2016   ANXIETY 06/30/2009   DEPRESSION 06/30/2009   Past Medical History:  Diagnosis Date   Anxiety    Crohn's disease (HCC)    Depression    Headache(784.0)    Ileitis    Opiate addiction (HCC)     Family History  Problem Relation Age of Onset   Colon cancer Maternal Grandfather    Celiac disease Maternal Aunt    Diabetes Maternal Grandmother    Hypothyroidism Maternal Grandmother    Heart disease Unknown    Diabetes Mother    Hypertension Mother    Hypothyroidism Mother    Hypertension Father    Heart disease Father    Hypothyroidism Paternal  Grandmother     Past Surgical History:  Procedure Laterality Date   COLONOSCOPY     HAND SURGERY Left    I & D EXTREMITY Left 11/06/2016   Procedure: IRRIGATION AND DEBRIDEMENT LEFT HAND;  Surgeon: Bradly Bienenstock, MD;  Location: [facility] OR;  Service: Orthopedics;  Laterality: Left;   I & D EXTREMITY Left 11/08/2016   Procedure: IRRIGATION AND DEBRIDEMENT LEFT HAND;  Surgeon: Bradly Bienenstock, MD;  Location[facility] OR;  Service: Orthopedics;  Laterality: Left;   I & D EXTREMITY Left 10/11/2022   Procedure: IRRIGATION AND DEBRIDEMENT OF ARM;  Surgeon: Nadara Mustard, MD;  Location: [facility] OR;  Service: Orthopedics;  Laterality: Left;   I & D EXTREMITY Left 10/13/2022   Procedure: DEBRIDEMENT LEFT FOREARM;  Surgeon: Nadara Mustard, MD;  Location: [facility] OR;  Service: Orthopedics;  Laterality: Left;   TONSILLECTOMY     Social History   Occupational History   Not on file  Tobacco Use   Smoking status: Some Days    Current packs/day: 0.00    Types: Cigarettes    Last attempt to quit: 12/01/2016    Years since quitting: 6.0   Smokeless tobacco: Never  Vaping Use   Vaping status: Some Days  Substance and Sexual Activity   Alcohol use: Not Currently    Comment: occasional, not during pregnancy   Drug use: Not Currently    Comment: Heroine    Sexual activity: Not Currently    Birth control/protection: None

## 2022-12-26 ENCOUNTER — Encounter: Payer: Self-pay | Admitting: Orthopedic Surgery

## 2022-12-29 ENCOUNTER — Ambulatory Visit (INDEPENDENT_AMBULATORY_CARE_PROVIDER_SITE_OTHER): Payer: Self-pay | Admitting: Family

## 2022-12-29 ENCOUNTER — Encounter: Payer: Self-pay | Admitting: Family

## 2022-12-29 DIAGNOSIS — S41102A Unspecified open wound of left upper arm, initial encounter: Secondary | ICD-10-CM

## 2022-12-29 NOTE — Progress Notes (Signed)
Post-Op Visit Note   Patient: Lauren Allen           Date of Birth: [birth year]           MRN: [id number] Visit Date: 12/29/2022 PCP: Pcp, No  Chief Complaint:  Chief Complaint  Patient presents with   Left Forearm - Routine Post Op    10/13/2022 left forearm debridement and graft     HPI:  HPI The patient is a 32 year old woman who is seen status post debridement left forearm has been wearing her compression sleeve with direct skin contact.  No concerns today. Ortho Exam On examination of the forearm the wound has healthy granulation tissue 100% there is no surrounding erythema or maceration no sign of infection.  Today the wound measures 11 cm x 4 cm at its widest.  There is no depth.  Visit Diagnoses: No diagnosis found.  Plan: She will continue with current wound care follow-up in the office in 4 weeks  Follow-Up Instructions: Return in about 27 days (around 01/25/2023).   Imaging: No results found.  Orders:  No orders of the defined types were placed in this encounter.  No orders of the defined types were placed in this encounter.    PMFS History: Patient Active Problem List   Diagnosis Date Noted   Arm wound, left, initial encounter 10/11/2022   Infection of forearm 10/10/2022   SVD (spontaneous vaginal delivery) 12/04/2016   Postpartum care following vaginal delivery 12/04/2016   Term pregnancy 12/03/2016   Opiate addiction (HCC) 11/06/2016   Abscess of left hand 11/06/2016   Pregnancy and infectious disease, third trimester 11/06/2016   Sepsis (HCC) 11/06/2016   Post-operative state 11/06/2016   Heroin abuse (HCC) 09/13/2016   ANXIETY 06/30/2009   DEPRESSION 06/30/2009   Past Medical History:  Diagnosis Date   Anxiety    Crohn's disease (HCC)    Depression    Headache(784.0)    Ileitis    Opiate addiction (HCC)     Family History  Problem Relation Age of Onset   Colon cancer Maternal Grandfather    Celiac disease Maternal Aunt    Diabetes  Maternal Grandmother    Hypothyroidism Maternal Grandmother    Heart disease Unknown    Diabetes Mother    Hypertension Mother    Hypothyroidism Mother    Hypertension Father    Heart disease Father    Hypothyroidism Paternal Grandmother     Past Surgical History:  Procedure Laterality Date   COLONOSCOPY     HAND SURGERY Left    I & D EXTREMITY Left 11/06/2016   Procedure: IRRIGATION AND DEBRIDEMENT LEFT HAND;  Surgeon: Bradly Bienenstock, MD;  Location: [facility] OR;  Service: Orthopedics;  Laterality: Left;   I & D EXTREMITY Left 11/08/2016   Procedure: IRRIGATION AND DEBRIDEMENT LEFT HAND;  Surgeon: Bradly Bienenstock, MD;  Location: [facility] OR;  Service: Orthopedics;  Laterality: Left;   I & D EXTREMITY Left 10/11/2022   Procedure: IRRIGATION AND DEBRIDEMENT OF ARM;  Surgeon: Nadara Mustard, MD;  Location: [facility] OR;  Service: Orthopedics;  Laterality: Left;   I & D EXTREMITY Left 10/13/2022   Procedure: DEBRIDEMENT LEFT FOREARM;  Surgeon: Nadara Mustard, MD;  Location: [facility] OR;  Service: Orthopedics;  Laterality: Left;   TONSILLECTOMY     Social History   Occupational History   Not on file  Tobacco Use   Smoking status: Some Days    Current packs/day: 0.00    Types: Cigarettes  Last attempt to quit: 12/01/2016    Years since quitting: 6.0   Smokeless tobacco: Never  Vaping Use   Vaping status: Some Days  Substance and Sexual Activity   Alcohol use: Not Currently    Comment: occasional, not during pregnancy   Drug use: Not Currently    Comment: Heroine    Sexual activity: Not Currently    Birth control/protection: None

## 2023-01-26 ENCOUNTER — Ambulatory Visit: Payer: Self-pay | Admitting: Family

## 2023-02-06 ENCOUNTER — Emergency Department (HOSPITAL_BASED_OUTPATIENT_CLINIC_OR_DEPARTMENT_OTHER)
Admission: EM | Admit: 2023-02-06 | Discharge: 2023-02-06 | Disposition: A | Discharge status: Home / Self Care | Payer: Medicaid Other | Attending: Emergency Medicine | Admitting: Emergency Medicine

## 2023-02-06 ENCOUNTER — Encounter (HOSPITAL_BASED_OUTPATIENT_CLINIC_OR_DEPARTMENT_OTHER): Payer: Self-pay

## 2023-02-06 ENCOUNTER — Emergency Department (HOSPITAL_BASED_OUTPATIENT_CLINIC_OR_DEPARTMENT_OTHER): Payer: Medicaid Other

## 2023-02-06 DIAGNOSIS — M542 Cervicalgia: Secondary | ICD-10-CM | POA: Diagnosis present

## 2023-02-06 LAB — CBC WITH DIFFERENTIAL/PLATELET
Abs Immature Granulocytes: 0.15 10*3/uL — ABNORMAL HIGH (ref 0.00–0.07)
Basophils Absolute: 0 10*3/uL (ref 0.0–0.1)
Basophils Relative: 0 %
Eosinophils Absolute: 0.2 10*3/uL (ref 0.0–0.5)
Eosinophils Relative: 1 %
HCT: 28.3 % — ABNORMAL LOW (ref 36.0–46.0)
Hemoglobin: 7.8 g/dL — ABNORMAL LOW (ref 12.0–15.0)
Immature Granulocytes: 1 %
Lymphocytes Relative: 12 %
Lymphs Abs: 1.6 10*3/uL (ref 0.7–4.0)
MCH: 17.3 pg — ABNORMAL LOW (ref 26.0–34.0)
MCHC: 27.6 g/dL — ABNORMAL LOW (ref 30.0–36.0)
MCV: 62.6 fL — ABNORMAL LOW (ref 80.0–100.0)
Monocytes Absolute: 0.5 10*3/uL (ref 0.1–1.0)
Monocytes Relative: 4 %
Neutro Abs: 11.1 10*3/uL — ABNORMAL HIGH (ref 1.7–7.7)
Neutrophils Relative %: 82 %
Platelets: 448 10*3/uL — ABNORMAL HIGH (ref 150–400)
RBC: 4.52 MIL/uL (ref 3.87–5.11)
RDW: 20.1 % — ABNORMAL HIGH (ref 11.5–15.5)
WBC: 13.6 10*3/uL — ABNORMAL HIGH (ref 4.0–10.5)
nRBC: 0 % (ref 0.0–0.2)

## 2023-02-06 LAB — BASIC METABOLIC PANEL
Anion gap: 9 (ref 5–15)
BUN: 12 mg/dL (ref 6–20)
CO2: 29 mmol/L (ref 22–32)
Calcium: 9.3 mg/dL (ref 8.9–10.3)
Chloride: 99 mmol/L (ref 98–111)
Creatinine, Ser: 0.67 mg/dL (ref 0.44–1.00)
GFR, Estimated: >60 mL/min (ref >60)
Glucose, Bld: 98 mg/dL (ref 70–99)
Potassium: 3.9 mmol/L (ref 3.5–5.1)
Sodium: 137 mmol/L (ref 135–145)

## 2023-02-06 MED ORDER — KETOROLAC TROMETHAMINE 30 MG/ML IJ SOLN
30.0000 mg | Freq: Once | INTRAMUSCULAR | Status: AC
  Administered 2023-02-06: 30 mg via INTRAMUSCULAR

## 2023-02-06 MED ORDER — KETOROLAC TROMETHAMINE 30 MG/ML IJ SOLN
30.0000 mg | Freq: Once | INTRAMUSCULAR | Status: DC
  Filled 2023-02-06: qty 1

## 2023-02-06 MED ORDER — NAPROXEN 500 MG PO TABS: 500.0000 mg | ORAL_TABLET | Freq: Two times a day (BID) | ORAL | 0 refills | Status: DC

## 2023-02-06 MED ORDER — KETOROLAC TROMETHAMINE 30 MG/ML IJ SOLN: 30.0000 mg | Freq: Once | INTRAMUSCULAR | Status: DC

## 2023-02-06 MED ORDER — CYCLOBENZAPRINE HCL 10 MG PO TABS: 10.0000 mg | ORAL_TABLET | Freq: Two times a day (BID) | ORAL | 0 refills | Status: DC | PRN

## 2023-02-06 NOTE — ED Triage Notes (Addendum)
Pt states neck and upper back pain started Sunday and has become worse. Has taken lidocaine patches, aleve and robaxin and nothing has relieved the pain Denies any injury Very stiff in triage has to turn whole body to turn neck

## 2023-02-06 NOTE — ED Notes (Signed)
Introduced myself to patient. Patient is ambulatory to the room. Patient reports neck pain at the base of her neck and top of her shoulders. States the pain has been persistent for 36 hours.

## 2023-02-06 NOTE — ED Provider Notes (Signed)
[place] EMERGENCY DEPARTMENT AT [facility]  Provider Note  CSN: [id number] Arrival date & time: 02/06/23 4098  History Chief Complaint  Patient presents with   Neck Pain    Lauren Allen is a 32 y.o. 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 No associated fevers, numbness/tingling in arms. She tried Aleve, lidoderm, robaxin without improvement. She is being followed by Ortho for chronic LUE wound which has been healing well since debridement over the summer.    Home Medications Prior to Admission medications   Medication Sig Start Date End Date Taking? Authorizing Provider  cyclobenzaprine (FLEXERIL) 10 MG tablet Take 1 tablet (10 mg total) by mouth 2 (two) times daily as needed for muscle spasms. 02/06/23  Yes Pollyann Savoy, MD  naproxen (NAPROSYN) 500 MG tablet Take 1 tablet (500 mg total) by mouth 2 (two) times daily. 02/06/23  Yes Pollyann Savoy, MD  methadone (DOLOPHINE) 10 MG/ML solution Take 145 mg by mouth daily.     [provider]     Allergies    Sulfa antibiotics   Review of Systems   Review of Systems Please see HPI for pertinent positives and negatives  Physical Exam BP (!) 144/81   Pulse 71   Temp 98 F (36.7 C)   Resp 15   Ht 5\' 3"  (1.6 m)   Wt 104.3 kg   LMP 01/09/2023 (Exact Date)   SpO2 98%   BMI 40.74 kg/m   Physical Exam Vitals and nursing note reviewed.  Constitutional:      Appearance: Normal appearance.  HENT:     Head: Normocephalic and atraumatic.     Nose: Nose normal.     Mouth/Throat:     Mouth: Mucous membranes are moist.  Eyes:     Extraocular Movements: Extraocular movements intact.     Conjunctiva/sclera: Conjunctivae normal.  Cardiovascular:     Rate and Rhythm: Normal rate.  Pulmonary:     Effort: Pulmonary effort is normal.     Breath sounds: Normal breath sounds.  Abdominal:     General: Abdomen is flat.     Palpations:  Abdomen is soft.     Tenderness: There is no abdominal tenderness.  Musculoskeletal:        General: No swelling. Normal range of motion.     Cervical back: Normal range of motion and neck supple. Tenderness (cervical paraspinal muscles, no midline tenderness) present. No rigidity.  Skin:    General: Skin is warm and dry.  Neurological:     General: No focal deficit present.     Mental Status: She is alert.     Cranial Nerves: No cranial nerve deficit.     Sensory: No sensory deficit.     Motor: No weakness.  Psychiatric:        Mood and Affect: Mood normal.     ED Results / Procedures / Treatments   EKG None  Procedures Procedures  Medications Ordered in the ED Medications  ketorolac (TORADOL) 30 MG/ML injection 30 mg (30 mg Intramuscular Given 02/06/23 0213)    Initial Impression and Plan  Patient here with neck pain, no radicular symptoms. 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 No fever today. Consider infectious cause, but felt to be less likely. Will check basic labs, CT, give Toradol for pain and reassess.   ED Course   Clinical Course as of 02/06/23 0434  Tue Feb 06, 2023  0312 CBC with mild leukocytosis and anemia, similar to previous. BMP is normal.  [CS]  0431 I personally viewed the images from radiology studies and agree with radiologist interpretation:  CT is neg for acute process. I discussed with the patient that this test is not as sensitive for abscess/discitis as MRI but that we do not have that modality available here. We discussed outpatient treatment vs transferring for MRI and she would prefer to give oral medications a day or two to work. If she is not getting better or if she begins to have fever, numbness, weakness or any other concerns, she will return immediately for re-eval, preferably [facility] where there is MRI available. [CS]    Clinical Course User Index [CS] Pollyann Savoy, MD     MDM Rules/Calculators/A&P Medical Decision  Making Given presenting complaint, I considered that admission might be necessary. After review of results from ED lab and/or imaging studies, admission to the hospital is not indicated at this time.     Problems Addressed: Neck pain: acute illness or injury  Amount and/or Complexity of Data Reviewed Labs: ordered. Decision-making details documented in ED Course. Radiology: ordered and independent interpretation performed. Decision-making details documented in ED Course.  Risk Prescription drug management. Decision regarding hospitalization.     Final Clinical Impression(s) / ED Diagnoses Final diagnoses:  Neck pain    Rx / DC Orders ED Discharge Orders          Ordered    naproxen (NAPROSYN) 500 MG tablet  2 times daily        02/06/23 0434    cyclobenzaprine (FLEXERIL) 10 MG tablet  2 times daily PRN        02/06/23 0434             Pollyann Savoy, MD 02/06/23 [phone]

## 2023-02-06 NOTE — ED Notes (Signed)
To CT

## 2023-02-07 ENCOUNTER — Inpatient Hospital Stay (HOSPITAL_COMMUNITY)
Admission: EM | Admit: 2023-02-07 | Discharge: 2023-02-15 | DRG: 095 | Disposition: A | Discharge status: Home / Self Care | Payer: Medicaid Other | Attending: Infectious Diseases | Admitting: Infectious Diseases

## 2023-02-07 ENCOUNTER — Emergency Department (HOSPITAL_COMMUNITY): Payer: Medicaid Other

## 2023-02-07 ENCOUNTER — Other Ambulatory Visit: Payer: Self-pay

## 2023-02-07 ENCOUNTER — Encounter (HOSPITAL_COMMUNITY): Payer: Self-pay | Admitting: Internal Medicine

## 2023-02-07 DIAGNOSIS — Z6841 Body Mass Index (BMI) 40.0 and over, adult: Secondary | ICD-10-CM

## 2023-02-07 DIAGNOSIS — A599 Trichomoniasis, unspecified: Secondary | ICD-10-CM | POA: Diagnosis present

## 2023-02-07 DIAGNOSIS — D75839 Thrombocytosis, unspecified: Secondary | ICD-10-CM | POA: Diagnosis present

## 2023-02-07 DIAGNOSIS — R000 Tachycardia, unspecified: Secondary | ICD-10-CM | POA: Diagnosis present

## 2023-02-07 DIAGNOSIS — R7881 Bacteremia: Secondary | ICD-10-CM | POA: Diagnosis present

## 2023-02-07 DIAGNOSIS — G061 Intraspinal abscess and granuloma: Secondary | ICD-10-CM | POA: Diagnosis present

## 2023-02-07 DIAGNOSIS — F17210 Nicotine dependence, cigarettes, uncomplicated: Secondary | ICD-10-CM | POA: Diagnosis present

## 2023-02-07 DIAGNOSIS — F1190 Opioid use, unspecified, uncomplicated: Secondary | ICD-10-CM | POA: Diagnosis present

## 2023-02-07 DIAGNOSIS — F419 Anxiety disorder, unspecified: Secondary | ICD-10-CM | POA: Diagnosis present

## 2023-02-07 DIAGNOSIS — L98499 Non-pressure chronic ulcer of skin of other sites with unspecified severity: Secondary | ICD-10-CM | POA: Diagnosis present

## 2023-02-07 DIAGNOSIS — B9562 Methicillin resistant Staphylococcus aureus infection as the cause of diseases classified elsewhere: Secondary | ICD-10-CM | POA: Diagnosis present

## 2023-02-07 DIAGNOSIS — Z8379 Family history of other diseases of the digestive system: Secondary | ICD-10-CM | POA: Diagnosis not present

## 2023-02-07 DIAGNOSIS — Z833 Family history of diabetes mellitus: Secondary | ICD-10-CM | POA: Diagnosis not present

## 2023-02-07 DIAGNOSIS — D509 Iron deficiency anemia, unspecified: Secondary | ICD-10-CM | POA: Diagnosis present

## 2023-02-07 DIAGNOSIS — F1990 Other psychoactive substance use, unspecified, uncomplicated: Secondary | ICD-10-CM | POA: Diagnosis present

## 2023-02-07 DIAGNOSIS — Z882 Allergy status to sulfonamides status: Secondary | ICD-10-CM | POA: Diagnosis not present

## 2023-02-07 DIAGNOSIS — R531 Weakness: Secondary | ICD-10-CM | POA: Diagnosis not present

## 2023-02-07 DIAGNOSIS — R252 Cramp and spasm: Secondary | ICD-10-CM | POA: Diagnosis present

## 2023-02-07 DIAGNOSIS — Z800 Family history of malignant neoplasm of digestive organs: Secondary | ICD-10-CM

## 2023-02-07 DIAGNOSIS — Z8249 Family history of ischemic heart disease and other diseases of the circulatory system: Secondary | ICD-10-CM

## 2023-02-07 DIAGNOSIS — R5383 Other fatigue: Secondary | ICD-10-CM | POA: Diagnosis present

## 2023-02-07 DIAGNOSIS — F1110 Opioid abuse, uncomplicated: Secondary | ICD-10-CM | POA: Diagnosis present

## 2023-02-07 DIAGNOSIS — R0902 Hypoxemia: Secondary | ICD-10-CM | POA: Diagnosis present

## 2023-02-07 DIAGNOSIS — M4642 Discitis, unspecified, cervical region: Secondary | ICD-10-CM | POA: Diagnosis present

## 2023-02-07 DIAGNOSIS — R0683 Snoring: Secondary | ICD-10-CM | POA: Diagnosis present

## 2023-02-07 DIAGNOSIS — I10 Essential (primary) hypertension: Secondary | ICD-10-CM | POA: Diagnosis present

## 2023-02-07 DIAGNOSIS — R200 Anesthesia of skin: Secondary | ICD-10-CM | POA: Diagnosis present

## 2023-02-07 DIAGNOSIS — G062 Extradural and subdural abscess, unspecified: Principal | ICD-10-CM | POA: Diagnosis present

## 2023-02-07 DIAGNOSIS — F32A Depression, unspecified: Secondary | ICD-10-CM | POA: Diagnosis present

## 2023-02-07 DIAGNOSIS — E669 Obesity, unspecified: Secondary | ICD-10-CM | POA: Diagnosis present

## 2023-02-07 DIAGNOSIS — Z79899 Other long term (current) drug therapy: Secondary | ICD-10-CM

## 2023-02-07 DIAGNOSIS — S41109A Unspecified open wound of unspecified upper arm, initial encounter: Secondary | ICD-10-CM | POA: Diagnosis present

## 2023-02-07 LAB — URINALYSIS, ROUTINE W REFLEX MICROSCOPIC
Bilirubin Urine: NEGATIVE
Glucose, UA: NEGATIVE mg/dL
Hgb urine dipstick: NEGATIVE
Ketones, ur: NEGATIVE mg/dL
Nitrite: NEGATIVE
Protein, ur: NEGATIVE mg/dL
Specific Gravity, Urine: 1.026 (ref 1.005–1.030)
pH: 6 (ref 5.0–8.0)

## 2023-02-07 LAB — BASIC METABOLIC PANEL
Anion gap: 14 (ref 5–15)
BUN: 7 mg/dL (ref 6–20)
CO2: 27 mmol/L (ref 22–32)
Calcium: 9.6 mg/dL (ref 8.9–10.3)
Chloride: 95 mmol/L — ABNORMAL LOW (ref 98–111)
Creatinine, Ser: 0.58 mg/dL (ref 0.44–1.00)
GFR, Estimated: >60 mL/min (ref >60)
Glucose, Bld: 101 mg/dL — ABNORMAL HIGH (ref 70–99)
Potassium: 4 mmol/L (ref 3.5–5.1)
Sodium: 136 mmol/L (ref 135–145)

## 2023-02-07 LAB — CBC WITH DIFFERENTIAL/PLATELET
Abs Immature Granulocytes: 0.22 10*3/uL — ABNORMAL HIGH (ref 0.00–0.07)
Basophils Absolute: 0.1 10*3/uL (ref 0.0–0.1)
Basophils Relative: 0 %
Eosinophils Absolute: 0.1 10*3/uL (ref 0.0–0.5)
Eosinophils Relative: 1 %
HCT: 28.9 % — ABNORMAL LOW (ref 36.0–46.0)
Hemoglobin: 7.9 g/dL — ABNORMAL LOW (ref 12.0–15.0)
Immature Granulocytes: 1 %
Lymphocytes Relative: 12 %
Lymphs Abs: 2 10*3/uL (ref 0.7–4.0)
MCH: 16.8 pg — ABNORMAL LOW (ref 26.0–34.0)
MCHC: 27.3 g/dL — ABNORMAL LOW (ref 30.0–36.0)
MCV: 61.6 fL — ABNORMAL LOW (ref 80.0–100.0)
Monocytes Absolute: 0.9 10*3/uL (ref 0.1–1.0)
Monocytes Relative: 5 %
Neutro Abs: 13.4 10*3/uL — ABNORMAL HIGH (ref 1.7–7.7)
Neutrophils Relative %: 81 %
Platelets: 483 10*3/uL — ABNORMAL HIGH (ref 150–400)
RBC: 4.69 MIL/uL (ref 3.87–5.11)
RDW: 20.4 % — ABNORMAL HIGH (ref 11.5–15.5)
WBC: 16.6 10*3/uL — ABNORMAL HIGH (ref 4.0–10.5)
nRBC: 0 % (ref 0.0–0.2)

## 2023-02-07 LAB — CBC
HCT: 28.9 % — ABNORMAL LOW (ref 36.0–46.0)
Hemoglobin: 7.8 g/dL — ABNORMAL LOW (ref 12.0–15.0)
MCH: 16.8 pg — ABNORMAL LOW (ref 26.0–34.0)
MCHC: 27 g/dL — ABNORMAL LOW (ref 30.0–36.0)
MCV: 62.3 fL — ABNORMAL LOW (ref 80.0–100.0)
Platelets: 489 10*3/uL — ABNORMAL HIGH (ref 150–400)
RBC: 4.64 MIL/uL (ref 3.87–5.11)
RDW: 20.4 % — ABNORMAL HIGH (ref 11.5–15.5)
WBC: 17.8 10*3/uL — ABNORMAL HIGH (ref 4.0–10.5)
nRBC: 0.1 % (ref 0.0–0.2)

## 2023-02-07 LAB — TECHNOLOGIST SMEAR REVIEW: Plt Morphology: NORMAL

## 2023-02-07 LAB — I-STAT CG4 LACTIC ACID, ED
Lactic Acid, Venous: 0.9 mmol/L (ref 0.5–1.9)
Lactic Acid, Venous: 0.4 mmol/L — ABNORMAL LOW (ref 0.5–1.9)

## 2023-02-07 MED ORDER — LINEZOLID 600 MG PO TABS
600.0000 mg | ORAL_TABLET | Freq: Two times a day (BID) | ORAL | Status: DC
  Administered 2023-02-07 – 2023-02-08 (×2): 600 mg via ORAL
  Filled 2023-02-07 (×3): qty 1

## 2023-02-07 MED ORDER — HEPARIN SODIUM (PORCINE) 5000 UNIT/ML IJ SOLN
5000.0000 [IU] | Freq: Three times a day (TID) | INTRAMUSCULAR | Status: DC
  Administered 2023-02-07 – 2023-02-14 (×22): 5000 [IU] via SUBCUTANEOUS
  Filled 2023-02-07 (×24): qty 1

## 2023-02-07 MED ORDER — SODIUM CHLORIDE 0.9 % IV SOLN: 2.0000 g | INTRAVENOUS | Status: DC

## 2023-02-07 MED ORDER — OXYCODONE-ACETAMINOPHEN 5-325 MG PO TABS
1.0000 | ORAL_TABLET | ORAL | Status: DC | PRN
  Administered 2023-02-07 – 2023-02-08 (×6): 2 via ORAL
  Filled 2023-02-07 (×7): qty 2

## 2023-02-07 MED ORDER — KETAMINE HCL 50 MG/5ML IJ SOSY
30.0000 mg | PREFILLED_SYRINGE | Freq: Once | INTRAMUSCULAR | Status: AC
  Administered 2023-02-07: 30 mg via INTRAVENOUS
  Filled 2023-02-07: qty 5

## 2023-02-07 MED ORDER — HYDROMORPHONE HCL 2 MG PO TABS
2.0000 mg | ORAL_TABLET | Freq: Four times a day (QID) | ORAL | Status: DC | PRN
  Administered 2023-02-07 – 2023-02-08 (×4): 2 mg via ORAL
  Filled 2023-02-07 (×5): qty 1

## 2023-02-07 MED ORDER — HYDROMORPHONE HCL 1 MG/ML IJ SOLN
2.0000 mg | Freq: Once | INTRAMUSCULAR | Status: AC
  Administered 2023-02-07: 2 mg via INTRAVENOUS
  Filled 2023-02-07: qty 2

## 2023-02-07 MED ORDER — NAPROXEN 250 MG PO TABS
500.0000 mg | ORAL_TABLET | Freq: Two times a day (BID) | ORAL | Status: DC
  Administered 2023-02-07 – 2023-02-09 (×5): 500 mg via ORAL
  Filled 2023-02-07 (×5): qty 2

## 2023-02-07 MED ORDER — NAPROXEN 250 MG PO TABS: 500.0000 mg | ORAL_TABLET | Freq: Two times a day (BID) | ORAL | Status: DC | PRN

## 2023-02-07 MED ORDER — LORAZEPAM 2 MG/ML IJ SOLN
1.0000 mg | Freq: Once | INTRAMUSCULAR | Status: AC
  Administered 2023-02-07: 1 mg via INTRAVENOUS
  Filled 2023-02-07: qty 1

## 2023-02-07 MED ORDER — ACETAMINOPHEN 500 MG PO TABS: 1000.0000 mg | ORAL_TABLET | Freq: Four times a day (QID) | ORAL | Status: DC | PRN

## 2023-02-07 MED ORDER — GADOBUTROL 1 MMOL/ML IV SOLN
10.0000 mL | Freq: Once | INTRAVENOUS | Status: AC | PRN
  Administered 2023-02-07: 10 mL via INTRAVENOUS

## 2023-02-07 MED ORDER — VANCOMYCIN HCL 1250 MG/250ML IV SOLN
1250.0000 mg | Freq: Two times a day (BID) | INTRAVENOUS | Status: DC
  Filled 2023-02-07 (×2): qty 250

## 2023-02-07 MED ORDER — SODIUM CHLORIDE 0.9 % IV SOLN
1.0000 g | Freq: Once | INTRAVENOUS | Status: AC
  Administered 2023-02-07: 1 g via INTRAVENOUS
  Filled 2023-02-07: qty 10

## 2023-02-07 MED ORDER — METRONIDAZOLE 500 MG/100ML IV SOLN
500.0000 mg | Freq: Four times a day (QID) | INTRAVENOUS | Status: DC
  Administered 2023-02-07: 500 mg via INTRAVENOUS
  Filled 2023-02-07 (×2): qty 100

## 2023-02-07 MED ORDER — SODIUM CHLORIDE 0.9 % IV SOLN: 2.0000 g | Freq: Two times a day (BID) | INTRAVENOUS | Status: DC

## 2023-02-07 MED ORDER — RIVAROXABAN 10 MG PO TABS: 10.0000 mg | ORAL_TABLET | Freq: Every day | ORAL | Status: DC

## 2023-02-07 MED ORDER — CIPROFLOXACIN HCL 500 MG PO TABS
750.0000 mg | ORAL_TABLET | Freq: Two times a day (BID) | ORAL | Status: DC
  Administered 2023-02-07 – 2023-02-08 (×2): 750 mg via ORAL
  Filled 2023-02-07 (×2): qty 2

## 2023-02-07 MED ORDER — METRONIDAZOLE 500 MG PO TABS
500.0000 mg | ORAL_TABLET | Freq: Two times a day (BID) | ORAL | Status: DC
  Administered 2023-02-07 – 2023-02-08 (×2): 500 mg via ORAL
  Filled 2023-02-07 (×2): qty 1

## 2023-02-07 MED ORDER — SODIUM CHLORIDE 0.9 % IV SOLN
1.0000 g | Freq: Once | INTRAVENOUS | Status: DC
  Filled 2023-02-07: qty 10

## 2023-02-07 MED ORDER — VANCOMYCIN HCL 2000 MG/400ML IV SOLN
2000.0000 mg | Freq: Once | INTRAVENOUS | Status: DC
  Filled 2023-02-07 (×2): qty 400

## 2023-02-07 NOTE — Consult Note (Signed)
WOC Nurse Consult Note: Reason for Consult: Consult requested for bilat arm wounds. Pt is followed by Dr Lajoyce Corners of the ortho service prior to admission and I reviewed photos and notes in the EMR and discussed plan of care with the patient; I did not remove current dressings since Pt was preparing to be transferred out of the ED to a room.  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  Topical treatment orders provided for bedside nurses to perform as  previously ordered: Apply kerlex and ace wraps to bilat arms Q evening.  Pt will have family bring in her compression sleeves from home and wear them during the day. Pt should follow-up with Dr Lajoyce Corners after discharge.  Please re-consult if further assistance is needed.  Thank-you,  Cammie Mcgee MSN, RN, CWOCN, [place], CNS [phone]

## 2023-02-07 NOTE — ED Provider Notes (Signed)
[place] EMERGENCY DEPARTMENT AT [facility] Provider Note   CSN: [id number] Arrival date & time: 02/07/23  0541     History  Chief Complaint  Patient presents with   Neck Pain     Lauren Allen is a 32 y.o. female past medical history significant for anxiety, depression, heroin abuse, chronic infection of left arm being followed by wound care who presents with concern for posterior neck pain, headache this evening.  Seen at [facility] yesterday morning for same.  She was told to follow-up at [facility] if pain persisted for possible MRI.  HPI     Home Medications Prior to Admission medications   Medication Sig Start Date End Date Taking? Authorizing Provider  cyclobenzaprine (FLEXERIL) 10 MG tablet Take 1 tablet (10 mg total) by mouth 2 (two) times daily as needed for muscle spasms. 02/06/23   Pollyann Savoy, MD  methadone (DOLOPHINE) 10 MG/ML solution Take 145 mg by mouth daily.     [provider]  naproxen (NAPROSYN) 500 MG tablet Take 1 tablet (500 mg total) by mouth 2 (two) times daily. 02/06/23   Pollyann Savoy, MD      Allergies    Sulfa antibiotics    Review of Systems   Review of Systems  All other systems reviewed and are negative.   Physical Exam Updated Vital Signs BP 129/87 (BP Location: Right Arm)   Pulse 98   Temp 98.3 F (36.8 C) (Oral)   Resp 18   LMP 01/09/2023 (Exact Date)   SpO2 98%  Physical Exam Vitals and nursing note reviewed.  Constitutional:      General: She is not in acute distress.    Appearance: Normal appearance.  HENT:     Head: Normocephalic and atraumatic.  Eyes:     General:        Right eye: No discharge.        Left eye: No discharge.  Cardiovascular:     Rate and Rhythm: Normal rate and regular rhythm.     Pulses: Normal pulses.     Heart sounds: No murmur heard.    No friction rub. No gallop.  Pulmonary:     Effort: Pulmonary effort is normal.     Breath sounds:  Normal breath sounds.  Abdominal:     General: Bowel sounds are normal.     Palpations: Abdomen is soft.  Musculoskeletal:     Comments: Significant point midline tenderness palpation around C7-T10, with intact strength 5/5 of bilateral upper and lower extremities.  No numbness or paresthesia endorsed by patient.  Skin:    General: Skin is warm and dry.     Capillary Refill: Capillary refill takes less than 2 seconds.  Neurological:     Mental Status: She is alert and oriented to person, place, and time.  Psychiatric:        Mood and Affect: Mood normal.        Behavior: Behavior normal.     ED Results / Procedures / Treatments   Labs (all labs ordered are listed, but only abnormal results are displayed) Labs Reviewed  CBC - Abnormal; Notable for the following components:      Result Value   WBC 17.8 (*)    Hemoglobin 7.8 (*)    HCT 28.9 (*)    MCV 62.3 (*)    MCH 16.8 (*)    MCHC 27.0 (*)    RDW 20.4 (*)    Platelets 489 (*)  All other components within normal limits  BASIC METABOLIC PANEL - Abnormal; Notable for the following components:   Chloride 95 (*)    Glucose, Bld 101 (*)    All other components within normal limits  I-STAT CG4 LACTIC ACID, ED - Abnormal; Notable for the following components:   Lactic Acid, Venous 0.4 (*)    All other components within normal limits  CULTURE, BLOOD (ROUTINE X 2)  CULTURE, BLOOD (ROUTINE X 2)  URINE CULTURE  URINALYSIS, ROUTINE W REFLEX MICROSCOPIC  I-STAT CG4 LACTIC ACID, ED    EKG None  Radiology MR THORACIC SPINE W WO CONTRAST  Result Date: 02/07/2023 CLINICAL DATA:  Thoracic osteomyelitis EXAM: MRI THORACIC WITHOUT AND WITH CONTRAST TECHNIQUE: Multiplanar and multiecho pulse sequences of the thoracic spine were obtained without and with intravenous contrast. CONTRAST:  10mL GADAVIST GADOBUTROL 1 MMOL/ML IV SOLN COMPARISON:  None Available. FINDINGS: Alignment:  Slight dextrocurvature. Vertebrae: No fracture, evidence  of discitis, or bone lesion. Cord: Normal signal and morphology. No collection in the thoracic spine. Known cervical ventral epidural collection as described on dedicated imaging. Paraspinal and other soft tissues: No perispinal inflammation or hematoma is seen. Disc levels: No significant degenerative change, diffuse disc height and hydration is preserved. IMPRESSION: No evidence of thoracic spine infection or collection. Electronically Signed   By: Tiburcio Pea M.D.   On: 02/07/2023 11:39   MR Cervical Spine W and Wo Contrast  Result Date: 02/07/2023 CLINICAL DATA:  Cervical osteomyelitis.  Severe neck pain. EXAM: MRI CERVICAL SPINE WITHOUT AND WITH CONTRAST TECHNIQUE: Multiplanar and multiecho pulse sequences of the cervical spine, to include the craniocervical junction and cervicothoracic junction, were obtained without and with intravenous contrast. CONTRAST:  10mL GADAVIST GADOBUTROL 1 MMOL/ML IV SOLN COMPARISON:  Cervical CT done yesterday. FINDINGS: Alignment: Straightening of the normal cervical lordosis. Vertebrae: No evidence of fracture. No abnormal bone marrow signal to suggest osteomyelitis. See below regarding epidural infection from C4-C7. Cord: No primary cord lesion. See below regarding stenosis C4 through C7. Posterior Fossa, vertebral arteries, paraspinal tissues: Posterior fossa appears unremarkable. Extra-spinal soft tissues appear normal. Disc levels: There is phlegmonous inflammation within the epidural space from upper C4 to upper C7 consistent with epidural phlegmonous inflammation. The discs at C4-5, C5-6 and C6-7 bulge. There may be enhancement of the posterior periphery of the C5-6 disc in the possibility of posterior discitis at this level which decompressed into the epidural space does exist. There are not advanced disc space infection findings. Subarachnoid space surrounding the cord is effaced and the cord appears somewhat flattened. Neuro surgical consultation recommended  for consideration of decompression. IMPRESSION: Epidural phlegmonous inflammation from upper C4 to upper C7. Effacement of the subarachnoid space surrounding the cord with flattening of the cord. No evidence of osteomyelitis. Possible posterior discitis at C5-6 which has decompressed into the epidural space, not definitely the origin. Neuro surgical consultation recommended for consideration of decompression. Critical Value/emergent results were called by telephone at the time of interpretation on 02/07/2023 at 10:29 am to provider [facility] , who verbally acknowledged these results. Electronically Signed   By: Paulina Fusi M.D.   On: 02/07/2023 10:30   CT Cervical Spine Wo Contrast  Result Date: 02/06/2023 CLINICAL DATA:  Neck pain, history of IV drug use, infection suspected EXAM: CT CERVICAL SPINE WITHOUT CONTRAST TECHNIQUE: Multidetector CT imaging of the cervical spine was performed without intravenous contrast. Multiplanar CT image reconstructions were also generated. RADIATION DOSE REDUCTION: This exam was performed according to  the departmental dose-optimization program which includes automated exposure control, adjustment of the mA and/or kV according to patient size and/or use of iterative reconstruction technique. COMPARISON:  03/27/2015 FINDINGS: Alignment: No traumatic listhesis. Skull base and vertebrae: No acute fracture. No primary bone lesion or focal pathologic process. Soft tissues and spinal canal: No prevertebral fluid or swelling. No visible canal hematoma. Disc levels: No significant spinal canal stenosis or neural foraminal narrowing. Upper chest: No focal pulmonary opacity or pleural effusion. Prominent right supraclavicular lymph node measures up to 1.2 cm in short axis. IMPRESSION: 1. No acute abnormality in the cervical spine. 2. Prominent right supraclavicular lymph node measuring up to 1.2 cm in short axis, nonspecific but possibly reactive. Electronically Signed   By:  Wiliam Ke M.D.   On: 02/06/2023 03:56    Procedures Procedures    Medications Ordered in ED Medications  metroNIDAZOLE (FLAGYL) IVPB 500 mg (has no administration in time range)  LORazepam (ATIVAN) injection 1 mg (1 mg Intravenous Given 02/07/23 0823)  HYDROmorphone (DILAUDID) injection 2 mg (2 mg Intravenous Given 02/07/23 0822)  ketamine 50 mg in normal saline 5 mL (10 mg/mL) syringe (30 mg Intravenous Given 02/07/23 0915)  LORazepam (ATIVAN) injection 1 mg (1 mg Intravenous Given 02/07/23 0915)  gadobutrol (GADAVIST) 1 MMOL/ML injection 10 mL (10 mLs Intravenous Contrast Given 02/07/23 0955)  cefTRIAXone (ROCEPHIN) 1 g in sodium chloride 0.9 % 100 mL IVPB (1 g Intravenous New Bag/Given 02/07/23 1103)    ED Course/ Medical Decision Making/ A&P Clinical Course as of 02/07/23 1155  Wed Feb 07, 2023  0949 WBC(!): 17.8 Uptrending WBC could be secondary to pain but also suspicious for worsening infection [CP]  1046 Dr. Franky Macho will consult, recommends medicine admission [CP]    Clinical Course User Index [CP] Olene Floss, PA-C                                 Medical Decision Making  This patient is a 32 y.o. female  who presents to the ED for concern of worsening neck pain.   Differential diagnoses prior to evaluation: The emergent differential diagnosis includes, but is not limited to,  epidural abscess, discitis, osteomyelitis vs other non infectious cervical spinal spondylosis, disc rupture, paraspinous muscle strain, sprain, vs other. Given hx of IVDU high suspicion for infectious etiology . This is not an exhaustive differential.   Past Medical History / Co-morbidities / Social History: anxiety, depression, heroin abuse, chronic infection of left arm being followed by wound care  Additional history: Chart reviewed. Pertinent results include: Extensively reviewed lab work, imaging from her emergency department visits, notably was seen in the ED yesterday and  told to come back for an MRI if pain persisted.  Physical Exam: Physical exam performed. The pertinent findings include: Significant point midline tenderness palpation around C7-T10, with intact strength 5/5 of bilateral upper and lower extremities.  No numbness or paresthesia endorsed by patient.   Lab Tests/Imaging studies: I personally interpreted labs/imaging and the pertinent results include: CBC notable for worsening leukocytosis, white blood cell 17.8 from around 14 yesterday, platelets elevated, suspicious for infection.  BMP overall unremarkable.  Blood cultures and urine cultures pending at this time.  Epidural phlegmonous inflammation from upper C4 to upper C7.  Effacement of the subarachnoid space surrounding the cord with  flattening of the cord. No evidence of osteomyelitis. Possible  posterior discitis at C5-6 which has decompressed into  the epidural  space, not definitely the origin. Neuro surgical consultation  recommended for consideration of decompression.  I agree with the radiologist interpretation.   Medications: I ordered medication including Ativan, Dilaudid, ketamine for pain, Rocephin, Flagyl, vancomycin for antibiotic coverage for epidural abscess/discitis.  I have reviewed the patients home medicines and have made adjustments as needed.   Consults: I spoke with neurosurgeon, Dr. Franky Macho who does not recommend immediate surgery on this patient given no focal neurologic deficits, he will consult on her hospital progress but recommends IV antibiotics at this time.  I spoke with internal medicine teaching service who agrees to admission for this patient  Disposition: After consideration of the diagnostic results and the patients response to treatment, I feel that patient would benefit from admission for cervical epidural infection as discussed above .   Final Clinical Impression(s) / ED Diagnoses Final diagnoses:  Epidural abscess    Rx / DC Orders ED Discharge  Orders     None         Olene Floss, PA-C 02/07/23 1156    Rexford Maus, DO 02/07/23 1540

## 2023-02-07 NOTE — ED Notes (Signed)
Patient transported to MRI 

## 2023-02-07 NOTE — H&P (Signed)
Date: 02/07/2023               Patient Name:  Lauren Allen MRN: [id number]  DOB: 1990-09-08 Age / Sex: 32 y.o., female   PCP: Pcp, No         Medical Service: Internal Medicine Teaching Service         Attending Physician: Dr. Dickie La, MD    First Contact: Scherrie November, MS-4 Pager: [phone]  Second Contact: Dr. Gwenevere Abbot Pager: [phone]       After Hours (After 5p/  First Contact Pager: [phone]  weekends / holidays): Second Contact Pager: [phone]   Chief Complaint: neck pain  History of Present Illness:  Lauren Allen is a 32 y.o. F with PMH anxiety, depression, Crohn's disease, opioid use disorder who presented to [facility] ED with neck pain admitted for epidural abscess. She was evaluated 10/15 where she reported this neck pain worse with movement over the prior 2 days. She had a negative CT.C spine and because she was at [facility], was not able to have MRI done. She was advised to present to [facility] ED for MRI if she had no improvement of symptoms or developed fever, numbness, or weakness.  She explains that the pain in her neck started abruptly on Sunday afternoon. She noticed gradual onset on Sunday afternoon of a "weird" feeling in her back. She has not had weakness but is having a lot of pain. She has had sensation of "hot coals" on her skin. No fever, chills, new rashes, difficulty breathing, heart palpitations.  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 These have required I&D in the past, most recently June 2024 where she also had skin graft placed on LUE wound, and she performs daily wound care as OP.  Lauren Allen states that she previously had 4 years clean until several distressing life events this year (specifically, she explains that her son had an accident (was shot, is okay now though) caused her to "spiral" and relapse. She now  is on methadone through [facility] where she picks up her methadone daily Wednesday-Friday and has take home doses for Saturday-Tuesday. She also sees a therapist and is planning to start going to Narcotics Anonymous meetings so that she can get back on track. Her drug of choice is heroin which she states that she sniffs and has not used IV drugs since June.  Meds:  Methadone 145 mg daily   Allergies: Allergies as of 02/07/2023 - Reviewed 02/07/2023  Allergen Reaction Noted   Sulfa antibiotics Palpitations 09/14/2016   Past Medical History: Past Medical History:  Diagnosis Date   Anxiety    Crohn's disease (HCC)    Depression    Headache(784.0)    Ileitis    Opiate addiction (HCC)    Past Surgical History: Past Surgical History:  Procedure Laterality Date   COLONOSCOPY     HAND SURGERY Left    I & D EXTREMITY Left 11/06/2016   Procedure: IRRIGATION AND DEBRIDEMENT LEFT HAND;  Surgeon: Bradly Bienenstock, MD;  Location: [facility] OR;  Service: Orthopedics;  Laterality: Left;   I & D EXTREMITY Left 11/08/2016   Procedure: IRRIGATION AND DEBRIDEMENT LEFT HAND;  Surgeon: Bradly Bienenstock, MD;  Location: [facility] OR;  Service: Orthopedics;  Laterality: Left;   I & D EXTREMITY Left 10/11/2022   Procedure: IRRIGATION AND DEBRIDEMENT OF ARM;  Surgeon: Nadara Mustard, MD;  Location: [facility] OR;  Service: Orthopedics;  Laterality: Left;   I & D EXTREMITY Left 10/13/2022   Procedure: DEBRIDEMENT LEFT FOREARM;  Surgeon: Nadara Mustard, MD;  Location: [facility] OR;  Service: Orthopedics;  Laterality: Left;   TONSILLECTOMY     Family History:  Family History  Problem Relation Age of Onset   Colon cancer Maternal Grandfather    Celiac disease Maternal Aunt    Diabetes Maternal Grandmother    Hypothyroidism Maternal Grandmother    Heart disease Unknown    Diabetes Mother    Hypertension Mother    Hypothyroidism Mother    Hypertension Father    Heart disease Father    Hypothyroidism Paternal Grandmother     Social History: Lives locally in [place]. No PCP. Independent in ADLs and IADLs. Denies alcohol use but has smoked 1/4 ppd since age [age] and has history of drug use, specifically heroin which she normally sniffs but did use IV drugs in June; last use of heroin ~1 week ago.  Review of Systems: A complete ROS was negative except as per HPI.   Physical Exam: Blood pressure 129/87, pulse 98, temperature 98.3 F (36.8 C), temperature source Oral, resp. rate 18, last menstrual period 01/09/2023, SpO2 98%. Constitutional:Sleeping comfortably in ED bed, in no acute distress. Cardio:Regular rate and rhythm. Upper R sternal border clicking murmur noted. Pulm:Clear to auscultation bilaterally. Normal work of breathing on room air. Abdomen:Soft, nontender, nondistended. ZOX:WRUEAVWU for extremity edema. Tender to palpation over midline vertebrae from base of skull to  ~T3. Skin:Warm and dry. No rashes or lesions appreciated. Finger and toe nails are painted so unable to evaluate nail beds. Neuro:Alert and oriented x3. No focal deficit noted. Psych:Pleasant mood and affect.  EKG: Pending.  MR Cervical spine: Epidural phlegmonous inflammation from upper C4 to upper C7. Effacement of the subarachnoid space surrounding the cord with flattening of the cord. No evidence of osteomyelitis. Possible posterior discitis at C5-6 which has decompressed into the epidural space, not definitely the origin. Neuro surgical consultation recommended for consideration of decompression.  MR Thoracic spine: No evidence of thoracic spine infection or collection.  Assessment & Plan by Problem: Principal Problem:   Epidural abscess  Epidural abscess C4-C7 Has bene afebrile, hemodynamically stable. Leukocytosis to 17.8, which is increased from when she was evaluated yesterday (13.6). Neurosurgery has evaluated and determined that there are no emergent operative interventions indicated. Neurological exam is normal.  Last IVDU was reportedly 4-5 months ago. No urinary symptoms, fevers, chills. Unfortunately ceftriaxone was given prior to blood and urine cultures being able to be collected. Plan: -Neurosurgery consulted, appreciate their assistance -F/u blood, urine cultures; initiate antibiotics after these are collected -ID consult -Echocardiogram ordered given murmur (no prior diagnosis per patient) on exam -Trend CBC, fever curve -Pain control with stepwise therapy: tylenol 1000 mg q6h, naproxen 500 mg BID, oxycodone 5 mg 1-2 tablets q4h PRN severe pain; this may need adjustment given methadone therapy  Opioid use disorder On methadone, gets this through [facility]. Last dose picked up this morning prior to ED presentation. Center is closed at this time so I am unable to verify her dosing. She does not have methadone with her at the hospital. Plan: -Confirm dosing with [facility] tomorrow morning and resume OP dose of methadone  Hx anxiety Hx depression Skin wounds, bilateral UE Has bilateral UE skin wounds that have previously required debridement and skin grafting (LUE) that she states started because of  skin picking manifesting from anxiety. She cleans the wounds daily with warm water and dish soap before drying them, applying  vaseline, and covering them. No evidence of acute soft tissue infection on my exam today. Plan: -Wound care consult  Microcytic anemia Hgb with gradual downtrend over last several months. Today is 7.8, MCV 62.3. No recent CMP to assess bilirubin (hemolysis?). Plan: -Will check iron studies, reticulocyte count -Trend CBC, transfuse for Hgb <7 -CMP tomorrow AM -F/u peripheral smear  Thrombocytosis Likely reactive in setting of acute infection. Plan: -Trend CBC -Will order peripheral smear  Intermittent hypoxia while sleeping Patient had mild, intermittent hypoxia while sleeping when I initially walked into her exam room. She does not carry a  diagnosis of OSA but does endorse snoring, daytime fatigue/somnolence, headaches. She thinks that she needs to be using a CPAP. STOP BANG score is 3. Plan: -Supplemental O2 daily at bedtime PRN -Recommend OP f/u  Dispo: Admit patient to Inpatient with expected length of stay greater than 2 midnights.  SignedChamp Mungo, DO 02/07/2023, 12:03 PM  After 5pm on weekdays and 1pm on weekends: On Call pager: [phone]

## 2023-02-07 NOTE — Progress Notes (Signed)
ED Pharmacy Antibiotic Sign Off An antibiotic consult was received from an ED provider for Vancomycin per pharmacy dosing for epidural, cervical spine infection/discitis. A chart review was completed to assess appropriateness.   The following one time order(s) were placed:  Vancomycin 2g IV x1  Further antibiotic and/or antibiotic pharmacy consults should be ordered by the admitting provider if indicated.   Thank you for allowing pharmacy to be a part of this patient's care.   Wilburn Cornelia, PharmD, BCPS Clinical Pharmacist 02/07/2023 12:12 PM   Please refer to [facility] for pharmacy phone number
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
## 2023-02-07 NOTE — ED Notes (Signed)
ED TO INPATIENT HANDOFF REPORT  ED Nurse Name and Phone #: [phone]   S Name/Age/Gender Lauren Allen 32 y.o. female Room/Bed: H014C/H014C  Code Status   Code Status: Full Code  Home/SNF/Other Home Patient oriented to: self, place, time, and situation Is this baseline? Yes   Triage Complete: Triage complete  Chief Complaint Epidural abscess [G06.2]  Triage Note Patient reports persistent posterior neck pain with headache this evening , seen at [facility] ER a few hours ago for the same complaints .    Allergies Allergies  Allergen Reactions   Sulfa Antibiotics Palpitations    Level of Care/Admitting Diagnosis ED Disposition     ED Disposition  Admit   Condition  --   Comment  Hospital Area: [facility] [[id number]]  Level of Care: Med-Surg [16]  May admit patient to [facility] or [facility] if equivalent level of care is available:: No  Covid Evaluation: Asymptomatic - no recent exposure (last 10 days) testing not required  Diagnosis: Epidural abscess [960454]  Admitting Physician: Dickie La [[id number]]  Attending Physician: Dickie La [[id number]]  Certification:: I certify this patient will need inpatient services for at least 2 midnights  Expected Medical Readiness: 02/10/2023          B Medical/Surgery History Past Medical History:  Diagnosis Date   Anxiety    Crohn's disease (HCC)    Depression    Headache(784.0)    Ileitis    Opiate addiction (HCC)    Past Surgical History:  Procedure Laterality Date   COLONOSCOPY     HAND SURGERY Left    I & D EXTREMITY Left 11/06/2016   Procedure: IRRIGATION AND DEBRIDEMENT LEFT HAND;  Surgeon: Bradly Bienenstock, MD;  Location: [facility] OR;  Service: Orthopedics;  Laterality: Left;   I & D EXTREMITY Left 11/08/2016   Procedure: IRRIGATION AND DEBRIDEMENT LEFT HAND;  Surgeon: Bradly Bienenstock, MD;  Location: [facility] OR;  Service: Orthopedics;  Laterality: Left;   I & D EXTREMITY Left 10/11/2022   Procedure:  IRRIGATION AND DEBRIDEMENT OF ARM;  Surgeon: Nadara Mustard, MD;  Location: [facility] OR;  Service: Orthopedics;  Laterality: Left;   I & D EXTREMITY Left 10/13/2022   Procedure: DEBRIDEMENT LEFT FOREARM;  Surgeon: Nadara Mustard, MD;  Location: [facility] OR;  Service: Orthopedics;  Laterality: Left;   TONSILLECTOMY       A IV Location/Drains/Wounds Patient Lines/Drains/Airways Status     Active Line/Drains/Airways     Name Placement date Placement time Site Days   Peripheral IV 02/07/23 20 G 1.88" Anterior;Left;Upper Arm 02/07/23  0817  Arm  less than 1            Intake/Output Last 24 hours No intake or output data in the 24 hours ending 02/07/23 1232  Labs/Imaging Results for orders placed or performed during the hospital encounter of 02/07/23 (from the past 48 hour(s))  CBC     Status: Abnormal   Collection Time: 02/07/23  8:28 AM  Result Value Ref Range   WBC 17.8 (H) 4.0 - 10.5 K/uL   RBC 4.64 3.87 - 5.11 MIL/uL   Hemoglobin 7.8 (L) 12.0 - 15.0 g/dL    Comment: Reticulocyte Hemoglobin testing may be clinically indicated, consider ordering this additional test [id number]    HCT 28.9 (L) 36.0 - 46.0 %   MCV 62.3 (L) 80.0 - 100.0 fL   MCH 16.8 (L) 26.0 - 34.0 pg   MCHC 27.0 (L) 30.0 - 36.0 g/dL   RDW  20.4 (H) 11.5 - 15.5 %   Platelets 489 (H) 150 - 400 K/uL    Comment: REPEATED TO VERIFY   nRBC 0.1 0.0 - 0.2 %    Comment: Performed at [facility], [address] [place], [place] [address]  Basic metabolic panel     Status: Abnormal   Collection Time: 02/07/23  8:28 AM  Result Value Ref Range   Sodium 136 135 - 145 mmol/L   Potassium 4.0 3.5 - 5.1 mmol/L   Chloride 95 (L) 98 - 111 mmol/L   CO2 27 22 - 32 mmol/L   Glucose, Bld 101 (H) 70 - 99 mg/dL    Comment: Glucose reference range applies only to samples taken after fasting for at least 8 hours.   BUN 7 6 - 20 mg/dL   Creatinine, Ser 6.04 0.44 - 1.00 mg/dL   Calcium 9.6 8.9 - 54.0 mg/dL   GFR, Estimated >98 >11  mL/min    Comment: (NOTE) Calculated using the CKD-EPI Creatinine Equation (2021)    Anion gap 14 5 - 15    Comment: Performed at [facility], [address] [place], [place] [address]  I-Stat CG4 Lactic Acid     Status: None   Collection Time: 02/07/23  8:34 AM  Result Value Ref Range   Lactic Acid, Venous 0.9 0.5 - 1.9 mmol/L  I-Stat CG4 Lactic Acid     Status: Abnormal   Collection Time: 02/07/23 10:41 AM  Result Value Ref Range   Lactic Acid, Venous 0.4 (L) 0.5 - 1.9 mmol/L   MR THORACIC SPINE W WO CONTRAST  Result Date: 02/07/2023 CLINICAL DATA:  Thoracic osteomyelitis EXAM: MRI THORACIC WITHOUT AND WITH CONTRAST TECHNIQUE: Multiplanar and multiecho pulse sequences of the thoracic spine were obtained without and with intravenous contrast. CONTRAST:  10mL GADAVIST GADOBUTROL 1 MMOL/ML IV SOLN COMPARISON:  None Available. FINDINGS: Alignment:  Slight dextrocurvature. Vertebrae: No fracture, evidence of discitis, or bone lesion. Cord: Normal signal and morphology. No collection in the thoracic spine. Known cervical ventral epidural collection as described on dedicated imaging. Paraspinal and other soft tissues: No perispinal inflammation or hematoma is seen. Disc levels: No significant degenerative change, diffuse disc height and hydration is preserved. IMPRESSION: No evidence of thoracic spine infection or collection. Electronically Signed   By: Tiburcio Pea M.D.   On: 02/07/2023 11:39   MR Cervical Spine W and Wo Contrast  Result Date: 02/07/2023 CLINICAL DATA:  Cervical osteomyelitis.  Severe neck pain. EXAM: MRI CERVICAL SPINE WITHOUT AND WITH CONTRAST TECHNIQUE: Multiplanar and multiecho pulse sequences of the cervical spine, to include the craniocervical junction and cervicothoracic junction, were obtained without and with intravenous contrast. CONTRAST:  10mL GADAVIST GADOBUTROL 1 MMOL/ML IV SOLN COMPARISON:  Cervical CT done yesterday. FINDINGS: Alignment: Straightening of  the normal cervical lordosis. Vertebrae: No evidence of fracture. No abnormal bone marrow signal to suggest osteomyelitis. See below regarding epidural infection from C4-C7. Cord: No primary cord lesion. See below regarding stenosis C4 through C7. Posterior Fossa, vertebral arteries, paraspinal tissues: Posterior fossa appears unremarkable. Extra-spinal soft tissues appear normal. Disc levels: There is phlegmonous inflammation within the epidural space from upper C4 to upper C7 consistent with epidural phlegmonous inflammation. The discs at C4-5, C5-6 and C6-7 bulge. There may be enhancement of the posterior periphery of the C5-6 disc in the possibility of posterior discitis at this level which decompressed into the epidural space does exist. There are not advanced disc space infection findings.  Subarachnoid space surrounding the cord is effaced and the cord appears somewhat flattened. Neuro surgical consultation recommended for consideration of decompression. IMPRESSION: Epidural phlegmonous inflammation from upper C4 to upper C7. Effacement of the subarachnoid space surrounding the cord with flattening of the cord. No evidence of osteomyelitis. Possible posterior discitis at C5-6 which has decompressed into the epidural space, not definitely the origin. Neuro surgical consultation recommended for consideration of decompression. Critical Value/emergent results were called by telephone at the time of interpretation on 02/07/2023 at 10:29 am to provider [facility] , who verbally acknowledged these results. Electronically Signed   By: Paulina Fusi M.D.   On: 02/07/2023 10:30   CT Cervical Spine Wo Contrast  Result Date: 02/06/2023 CLINICAL DATA:  Neck pain, history of IV drug use, infection suspected EXAM: CT CERVICAL SPINE WITHOUT CONTRAST TECHNIQUE: Multidetector CT imaging of the cervical spine was performed without intravenous contrast. Multiplanar CT image reconstructions were also generated.  RADIATION DOSE REDUCTION: This exam was performed according to the departmental dose-optimization program which includes automated exposure control, adjustment of the mA and/or kV according to patient size and/or use of iterative reconstruction technique. COMPARISON:  03/27/2015 FINDINGS: Alignment: No traumatic listhesis. Skull base and vertebrae: No acute fracture. No primary bone lesion or focal pathologic process. Soft tissues and spinal canal: No prevertebral fluid or swelling. No visible canal hematoma. Disc levels: No significant spinal canal stenosis or neural foraminal narrowing. Upper chest: No focal pulmonary opacity or pleural effusion. Prominent right supraclavicular lymph node measures up to 1.2 cm in short axis. IMPRESSION: 1. No acute abnormality in the cervical spine. 2. Prominent right supraclavicular lymph node measuring up to 1.2 cm in short axis, nonspecific but possibly reactive. Electronically Signed   By: Wiliam Ke M.D.   On: 02/06/2023 03:56    Pending Labs Unresulted Labs (From admission, onward)     Start     Ordered   02/08/23 0500  CBC  Tomorrow morning,   R        02/07/23 1202   02/08/23 0500  Comprehensive metabolic panel  Tomorrow morning,   R        02/07/23 1202   02/07/23 1047  Urinalysis, Routine w reflex microscopic -Urine, Clean Catch  Once,   URGENT       Question:  Specimen Source  Answer:  Urine, Clean Catch   02/07/23 1046   02/07/23 1043  Blood culture (routine x 2)  BLOOD CULTURE X 2,   R (with STAT occurrences)      02/07/23 1046   02/07/23 1043  Urine Culture  Once,   URGENT       Question:  Indication  Answer:  Dysuria   02/07/23 1046            Vitals/Pain Today's Vitals   02/07/23 1018 02/07/23 1018 02/07/23 1022 02/07/23 1145  BP: 129/82 129/87  123/76  Pulse: 98 98  (!) 104  Resp: 18 18  18   Temp:  98.3 F (36.8 C) 98.3 F (36.8 C)   TempSrc:  Oral Oral   SpO2: 97% 98%  100%  PainSc:  7       Isolation Precautions No  active isolations  Medications Medications  metroNIDAZOLE (FLAGYL) IVPB 500 mg (has no administration in time range)  vancomycin (VANCOREADY) IVPB 2000 mg/400 mL (has no administration in time range)  heparin injection 5,000 Units (has no administration in time range)  LORazepam (ATIVAN) injection 1 mg (1 mg Intravenous Given 02/07/23  7846)  HYDROmorphone (DILAUDID) injection 2 mg (2 mg Intravenous Given 02/07/23 0822)  ketamine 50 mg in normal saline 5 mL (10 mg/mL) syringe (30 mg Intravenous Given 02/07/23 0915)  LORazepam (ATIVAN) injection 1 mg (1 mg Intravenous Given 02/07/23 0915)  gadobutrol (GADAVIST) 1 MMOL/ML injection 10 mL (10 mLs Intravenous Contrast Given 02/07/23 0955)  cefTRIAXone (ROCEPHIN) 1 g in sodium chloride 0.9 % 100 mL IVPB (0 g Intravenous Stopped 02/07/23 1157)    Mobility walks     Focused Assessments Wounds that she picks at.  Opiate use and epidural abscess   R Recommendations: See Admitting Provider Note  Report given to:   Additional Notes: [id number]

## 2023-02-07 NOTE — ED Notes (Signed)
Pt restless, ambulating on room. Complaints of neck pain 10 out 10.

## 2023-02-07 NOTE — Progress Notes (Signed)
PICC recommended d/t bilateral lower arm wounds/wrapped limiting vasculature. Tomasita Morrow, RN VAST

## 2023-02-07 NOTE — Hospital Course (Addendum)
Admitted 02/07/2023  Allergies: Sulfa antibiotics Pertinent Hx: OUD, GAD, MDD, Crohn's  HPI per Dr. August Saucer: Lauren Allen is a 32 y.o. F with PMH anxiety, depression, Crohn's disease, opioid use disorder who presented to [facility] ED with neck pain admitted for epidural abscess. She was evaluated 10/15 where she reported this neck pain worse with movement over the prior 2 days. She had a negative CT.C spine and because she was at [facility], was not able to have MRI done. She was advised to present to [facility] ED for MRI if she had no improvement of symptoms or developed fever, numbness, or weakness.   She explains that the pain in her neck started abruptly on Sunday afternoon. She noticed gradual onset on Sunday afternoon of a "weird" feeling in her back. She has not had weakness but is having a lot of pain. She has had sensation of "hot coals" on her skin. No fever, chills, new rashes, difficulty breathing, heart palpitations.   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 These have required I&D in the past, most recently June 2024 where she also had skin graft placed on LUE wound, and she performs daily wound care as OP.   Lauren Allen states that she previously had 4 years clean until several distressing life events this year (specifically, she explains that her son had an accident (was shot, is okay now though) caused her to "spiral" and relapse. She now is on methadone through [facility] where she picks up her methadone daily Wednesday-Friday and has take home doses for Saturday-Tuesday. She also sees a therapist and is planning to start going to Narcotics Anonymous meetings so that she can get back on track. Her drug of choice is heroin which she states that she sniffs and has not used IV drugs since June.   MR Cervical spine: Epidural phlegmonous inflammation  from upper C4 to upper C7. Effacement of the subarachnoid space surrounding the cord with flattening of the cord. No evidence of osteomyelitis. Possible posterior discitis at C5-6 which has decompressed into the epidural space, not definitely the origin. Neuro surgical consultation recommended for consideration of decompression.  Epidural Abscess C4-C7 Secondary to MRSA Bacteremia Patient has been afebrile with complaints of neck pain along with muscle tightness. Leukocytosis on arrival of 17.8 that is continuing to improve. Lactic acid is 0.4. Cervical MRI showed epidural phlegmonous inflammation from upper C4 to upper C7. Effacement of the subarachnoid space surrounding the cord with flattening of the cord. No evidence of osteomyelitis. Possible posterior discitis at C5-6 which has decompressed into the epidural space, not definitely the origin  -Patient had difficulty gaining IV access due to bilateral wounds in the forearm. Patient bridged with oral Flagyl, Zyvox, Cipro until IV access was established. -IR was consulted for tunneled catheter placement. -Neurosurgery does not recommend any surgical intervention at this time.  -Blood and urine cultures drawn 10/16. Blood culture x3 grew MRSA. ID recommends continuing with IV Vancomycin for at least one week before attempting line holiday -TTE ordered to r/o infective endocarditis. TTE is normal. ID does not recommend TEE due to risk of paralysis given epidural abscess. -Continue to trend CBC -Pain control with stepwise therapy: Tylenol 1000 mg Q4 PRN for mild pain, Oxycodone 15 mg Q4 PRN for severe pain 1-2 tablets Q4 PRN, Dilaudid 2 mg Q3 PRN for breakthrough pain. Toradol added at 15  mg Q8 PRN which pt states helps.  -Also on methadone 55 mg tid (home dose 165 mg daily) -Lidocaine patch for neck pain -Robaxin 1000 QID for muscle tightness -ID reassessed patient and recommends removing the central line soon with a 72 hour holiday.They recommend  continuing Vancomycin- -Blood cultures drawn 10/18 do not show any growth -IR consulted on 10/22 for central line removal per ID recommendations. ID recommends line holiday for 72 hours -We are discussing bridging patient with oral Linezolid while the line is removed as patient does not have any other IV access. Appreciate pharmacy assistance with this -Oral Linezolid 600 BID started for 5 weeks -Pain medications also transitioned to po since patient has no line access   Left Hand Numbness/Weakness Bilateral Lower Extremity Muscle Cramps Patient reports intermitted lower extremity cramps and some decreased sensation of the left lateral hand. Strength is 5/5 in the upper and lower extremities. Patient B12 and Mg WNL -Will continue to monitor symptoms. No interventions indicated at this time. -Will consult neurosurgery. Neurosurgery saw the patient and noted triceps weakness. Repeat Cervical MRI ordered did not show progression of spinal cord compression and possible C5-C6 ACD. -10/22 Patient reports that her weakness is improving and neurosurgery also observed improvement -10/23 Awaiting neurosurgery clearance for discharge. Neurosurgery cleared patient for discharge and stated they will follow her OP in 2 weeks. Plan discussed with patient.  Opioid Use Disorder Patient has a history of heroin use. Confirmed Methadone dosage of 165 mg from [facility]. Last IV drug use was 11/2022. -Continue Methadone 55 mg TID   Chronic Skin Wounds, Bilateral UE Hx of Anxiety Hx of Depression Has bilateral skin wounds on the forearms that has required debridement and skin grafting in the past. Patient reports skin picking due to anxiety. -Wound care consulted and applying topical treatments and bandages. WC recommends patient continue her OP follow-up with Dr. Lajoyce Corners   Microcytic Anemia Hgb has been downtrending for the past four months. Her baseline has been around 7.5. MCV is 61.6. Suspect  this is secondary to IDA and her bacteremia. No signs of bleeding reported.  Iron 13, TIBC 3, and Ferritin 17. -Patient given 1 unit of PRBC after hemoglobin of 6.6 on 10/19. Hgb the next day improved to 7.4. -Given acute infection involving the spine, recommend iron supplementation outpatient -Will transfuse as needed for Hgb <7   Trichomoniasis Positive urine analysis but patient denies any recent sexual activity or prior infection. -Patient is already covered with Flagyl treatment. Will continue Metronidazole 500 po BID treatment for 7 days ending 10/22 -10/23 Patient would like gonorrhea,chlamydia, and syphilis testing. Ordered   Thrombocytosis Platelet 489 on admission and continuing to decrease. 394 today. Likely elevated in the setting of infection. Platelet smear shows normal morphology. -Will continue to monitor  Tobacco Use Disorder Patient smokes 1/4 ppd for roughly 16 years -Nicotine patch 14 mg   Intermittent Hypoxia while Sleeping Patient does not have a formal diagnosis of OSA. STOP BANG score is 3. She endorses some daytime fatigue, headache, and heavy snoring during sleep. -Recommend sleep studies outpatient -Supplemental O2 at bedtime PRN   Patient will [facility] continue follow up there.  She will also be seeing Dr. Lajoyce Corners for her bilateral wounds and [facility] for her epidural abscess.

## 2023-02-07 NOTE — Progress Notes (Signed)
Pharmacy Antibiotic Note  Lauren Allen is a 32 y.o. female admitted on 02/07/2023 with complaints of neck pain and found to have epidural abscess of the cervical space.  Pharmacy has been consulted for Vancomycin dosing.  Will target more CNS concentrations for now with goal trough 15-20 mcg/ml given infection in the cervical space. Will also adjust Rocephin dosing to be higher for now.  Plan: - Start Vancomycin 1250 mg IV every 12 hours (goal trough 15-20 mcg/ml) - Adjust Rocephin to 2g IV every 12 hours - Change from IV to oral Metronidazole 500 mg po bid for now - Will continue to follow renal function, culture results, LOT, and antibiotic de-escalation plans      Temp (24hrs), Avg:98.2 F (36.8 C), Min:98.1 F (36.7 C), Max:98.3 F (36.8 C)  Recent Labs  Lab 02/06/23 0213 02/07/23 0828 02/07/23 0834 02/07/23 1041  WBC 13.6* 17.8*  --   --   CREATININE 0.67 0.58  --   --   LATICACIDVEN  --   --  0.9 0.4*    Estimated Creatinine Clearance: 116.7 mL/min (by C-G formula based on SCr of 0.58 mg/dL).    Allergies  Allergen Reactions   Sulfa Antibiotics Palpitations    Antimicrobials this admission: Vancomycin 10/16 >> Rocephin 10/16 >> Flagyl 10/16 >>  Dose adjustments this admission: N/a  Microbiology results: 10/16 BCx >> 10/16 UCx >>  Thank you for allowing pharmacy to be a part of this patient's care.  Georgina Pillion, PharmD, BCPS, BCIDP Infectious Diseases Clinical Pharmacist 02/07/2023 2:19 PM   **Pharmacist phone directory can now be found on [email or web address] ([email or web address]).  Listed under [facility].

## 2023-02-07 NOTE — Consult Note (Signed)
Reason for Consult:epidural abscess, cervical, neck pain Referring Physician: Sora, Lauren Allen is an 32 y.o. female.  HPI: whom was seen yesterday at the [facility] facility for neck pain. CT cervical spine was unrevealing yesterday. She was instructed to come back to the ED if the pain did not improve. Today an MRI showed probable epidural abscess in the cervical spine with mild cord distortion. She also admits to IV heroin use two weeks ago due to a family trauma. She states it was  relapse, though yesterday she stated the drug use was in June.  Past Medical History:  Diagnosis Date   Anxiety    Crohn's disease (HCC)    Depression    Headache(784.0)    Ileitis    Opiate addiction (HCC)     Past Surgical History:  Procedure Laterality Date   COLONOSCOPY     HAND SURGERY Left    I & D EXTREMITY Left 11/06/2016   Procedure: IRRIGATION AND DEBRIDEMENT LEFT HAND;  Surgeon: Bradly Bienenstock, MD;  Location: [facility] OR;  Service: Orthopedics;  Laterality: Left;   I & D EXTREMITY Left 11/08/2016   Procedure: IRRIGATION AND DEBRIDEMENT LEFT HAND;  Surgeon: Bradly Bienenstock, MD;  Location: [facility] OR;  Service: Orthopedics;  Laterality: Left;   I & D EXTREMITY Left 10/11/2022   Procedure: IRRIGATION AND DEBRIDEMENT OF ARM;  Surgeon: Nadara Mustard, MD;  Location: [facility] OR;  Service: Orthopedics;  Laterality: Left;   I & D EXTREMITY Left 10/13/2022   Procedure: DEBRIDEMENT LEFT FOREARM;  Surgeon: Nadara Mustard, MD;  Location: [facility] OR;  Service: Orthopedics;  Laterality: Left;   TONSILLECTOMY      Family History  Problem Relation Age of Onset   Colon cancer Maternal Grandfather    Celiac disease Maternal Aunt    Diabetes Maternal Grandmother    Hypothyroidism Maternal Grandmother    Heart disease Unknown    Diabetes Mother    Hypertension Mother    Hypothyroidism Mother    Hypertension Father    Heart disease Father    Hypothyroidism Paternal Grandmother     Social History:  reports  that she has been smoking cigarettes. She has never used smokeless tobacco. She reports that she does not currently use alcohol. She reports that she does not currently use drugs.  Allergies:  Allergies  Allergen Reactions   Sulfa Antibiotics Palpitations    Medications: I have reviewed the patient's current medications.  Results for orders placed or performed during the hospital encounter of 02/07/23 (from the past 48 hour(s))  CBC     Status: Abnormal   Collection Time: 02/07/23  8:28 AM  Result Value Ref Range   WBC 17.8 (H) 4.0 - 10.5 K/uL   RBC 4.64 3.87 - 5.11 MIL/uL   Hemoglobin 7.8 (L) 12.0 - 15.0 g/dL    Comment: Reticulocyte Hemoglobin testing may be clinically indicated, consider ordering this additional test [id number]    HCT 28.9 (L) 36.0 - 46.0 %   MCV 62.3 (L) 80.0 - 100.0 fL   MCH 16.8 (L) 26.0 - 34.0 pg   MCHC 27.0 (L) 30.0 - 36.0 g/dL   RDW 54.0 (H) 98.1 - 19.1 %   Platelets 489 (H) 150 - 400 K/uL    Comment: REPEATED TO VERIFY   nRBC 0.1 0.0 - 0.2 %    Comment: Performed at [facility], [address], [place], [place] [address]  Basic metabolic panel     Status: Abnormal  Collection Time: 02/07/23  8:28 AM  Result Value Ref Range   Sodium 136 135 - 145 mmol/L   Potassium 4.0 3.5 - 5.1 mmol/L   Chloride 95 (L) 98 - 111 mmol/L   CO2 27 22 - 32 mmol/L   Glucose, Bld 101 (H) 70 - 99 mg/dL    Comment: Glucose reference range applies only to samples taken after fasting for at least 8 hours.   BUN 7 6 - 20 mg/dL   Creatinine, Ser 1.91 0.44 - 1.00 mg/dL   Calcium 9.6 8.9 - 47.8 mg/dL   GFR, Estimated >29 >56 mL/min    Comment: (NOTE) Calculated using the CKD-EPI Creatinine Equation (2021)    Anion gap 14 5 - 15    Comment: Performed at [facility], [address], [place], [place] [address]  I-Stat CG4 Lactic Acid     Status: None   Collection Time: 02/07/23  8:34 AM  Result Value Ref Range   Lactic Acid, Venous 0.9 0.5 - 1.9 mmol/L   I-Stat CG4 Lactic Acid     Status: Abnormal   Collection Time: 02/07/23 10:41 AM  Result Value Ref Range   Lactic Acid, Venous 0.4 (L) 0.5 - 1.9 mmol/L    MR Cervical Spine W and Wo Contrast  Result Date: 02/07/2023 CLINICAL DATA:  Cervical osteomyelitis.  Severe neck pain. EXAM: MRI CERVICAL SPINE WITHOUT AND WITH CONTRAST TECHNIQUE: Multiplanar and multiecho pulse sequences of the cervical spine, to include the craniocervical junction and cervicothoracic junction, were obtained without and with intravenous contrast. CONTRAST:  10mL GADAVIST GADOBUTROL 1 MMOL/ML IV SOLN COMPARISON:  Cervical CT done yesterday. FINDINGS: Alignment: Straightening of the normal cervical lordosis. Vertebrae: No evidence of fracture. No abnormal bone marrow signal to suggest osteomyelitis. See below regarding epidural infection from C4-C7. Cord: No primary cord lesion. See below regarding stenosis C4 through C7. Posterior Fossa, vertebral arteries, paraspinal tissues: Posterior fossa appears unremarkable. Extra-spinal soft tissues appear normal. Disc levels: There is phlegmonous inflammation within the epidural space from upper C4 to upper C7 consistent with epidural phlegmonous inflammation. The discs at C4-5, C5-6 and C6-7 bulge. There may be enhancement of the posterior periphery of the C5-6 disc in the possibility of posterior discitis at this level which decompressed into the epidural space does exist. There are not advanced disc space infection findings. Subarachnoid space surrounding the cord is effaced and the cord appears somewhat flattened. Neuro surgical consultation recommended for consideration of decompression. IMPRESSION: Epidural phlegmonous inflammation from upper C4 to upper C7. Effacement of the subarachnoid space surrounding the cord with flattening of the cord. No evidence of osteomyelitis. Possible posterior discitis at C5-6 which has decompressed into the epidural space, not definitely the origin. Neuro  surgical consultation recommended for consideration of decompression. Critical Value/emergent results were called by telephone at the time of interpretation on 02/07/2023 at 10:29 am to provider [facility] , who verbally acknowledged these results. Electronically Signed   By: Paulina Fusi M.D.   On: 02/07/2023 10:30   CT Cervical Spine Wo Contrast  Result Date: 02/06/2023 CLINICAL DATA:  Neck pain, history of IV drug use, infection suspected EXAM: CT CERVICAL SPINE WITHOUT CONTRAST TECHNIQUE: Multidetector CT imaging of the cervical spine was performed without intravenous contrast. Multiplanar CT image reconstructions were also generated. RADIATION DOSE REDUCTION: This exam was performed according to the departmental dose-optimization program which includes automated exposure control, adjustment of the mA and/or kV according to patient size and/or use of iterative reconstruction  technique. COMPARISON:  03/27/2015 FINDINGS: Alignment: No traumatic listhesis. Skull base and vertebrae: No acute fracture. No primary bone lesion or focal pathologic process. Soft tissues and spinal canal: No prevertebral fluid or swelling. No visible canal hematoma. Disc levels: No significant spinal canal stenosis or neural foraminal narrowing. Upper chest: No focal pulmonary opacity or pleural effusion. Prominent right supraclavicular lymph node measures up to 1.2 cm in short axis. IMPRESSION: 1. No acute abnormality in the cervical spine. 2. Prominent right supraclavicular lymph node measuring up to 1.2 cm in short axis, nonspecific but possibly reactive. Electronically Signed   By: Wiliam Ke M.D.   On: 02/06/2023 03:56    Review of Systems  Constitutional:  Positive for activity change.  HENT: Negative.    Eyes: Negative.   Respiratory: Negative.    Cardiovascular: Negative.   Gastrointestinal: Negative.   Endocrine: Negative.   Genitourinary: Negative.   Musculoskeletal:  Positive for neck pain and neck  stiffness.  Skin: Negative.   Allergic/Immunologic: Negative.   Neurological:  Positive for headaches.  Hematological: Negative.   Psychiatric/Behavioral: Negative.     Blood pressure 129/87, pulse 98, temperature 98.3 F (36.8 C), temperature source Oral, resp. rate 18, last menstrual period 01/09/2023, SpO2 98%. Physical Exam Constitutional:      Appearance: She is ill-appearing.  HENT:     Head: Normocephalic and atraumatic.     Right Ear: External ear normal.     Left Ear: External ear normal.     Nose: Nose normal.     Mouth/Throat:     Mouth: Mucous membranes are moist.     Pharynx: Oropharynx is clear.  Eyes:     Extraocular Movements: Extraocular movements intact.     Conjunctiva/sclera: Conjunctivae normal.     Pupils: Pupils are equal, round, and reactive to light.  Cardiovascular:     Rate and Rhythm: Normal rate and regular rhythm.  Pulmonary:     Effort: Pulmonary effort is normal.     Breath sounds: Normal breath sounds.  Abdominal:     General: Abdomen is flat.  Musculoskeletal:        General: Normal range of motion.     Cervical back: Rigidity and tenderness present.  Skin:    General: Skin is warm and dry.  Neurological:     Mental Status: She is alert and oriented to person, place, and time.     Cranial Nerves: Cranial nerves 2-12 are intact.     Sensory: Sensation is intact.     Motor: Motor function is intact.     Coordination: Coordination is intact.     Gait: Gait is intact.     Deep Tendon Reflexes:     Reflex Scores:      Tricep reflexes are 3+ on the right side and 3+ on the left side.      Bicep reflexes are 3+ on the right side and 3+ on the left side.      Brachioradialis reflexes are 3+ on the right side and 3+ on the left side.      Patellar reflexes are 3+ on the right side and 3+ on the left side.      Achilles reflexes are 2+ on the right side and 2+ on the left side.    Comments: +hoffman's sign bilaterally +clonus, not sustained  at ankles 5/5 strength in upper and lower extremities Intact proprioception upper and lower extremities Gait not assessed. Coordination in upper and lower extremities normal  Assessment/Plan: Lauren Allen is a 32 y.o. female Recent history of IV heroin use presents with neck pain, and mri evidence of epidural abscess. No abnormal cord signal, no significant compression of the spinal cord. Prevertebral swelling noted. No cervical instability noted.  Recommend blood cultures, urine cultures, ID consult for abx recommendations. Please obtain cultures prior to abx initiation. Will follow, no operative indications given normal neurological exam.  Coletta Memos 02/07/2023, 10:57 AM

## 2023-02-07 NOTE — Progress Notes (Signed)
      INFECTIOUS DISEASE ATTENDING ADDENDUM:   Date: 02/07/2023  Patient name: Lauren Allen  Medical record number: [id number]  Date of birth: 16-May-1990   Patient lost IV access and not candidate for PICC with her B UE wounds per discussion with PICC RN  I will bridge her with zyvox, cipro and flagyl unntil she gets acess again    [employer] 02/07/2023, 5:21 PM

## 2023-02-07 NOTE — ED Triage Notes (Signed)
Patient reports persistent posterior neck pain with headache this evening , seen at [facility] ER a few hours ago for the same complaints .

## 2023-02-07 NOTE — Progress Notes (Signed)
PICC order received. Placing PICC in either UE is very hight risk of infection due to wounds. Spoke with ID, Paulette Blanch Dam, recommend IR to place tunneled CVC. Patient has no current IV access. Per ID will order oral antibiotics until IV access establish. RN made aware.

## 2023-02-08 ENCOUNTER — Inpatient Hospital Stay (HOSPITAL_COMMUNITY): Payer: Medicaid Other

## 2023-02-08 DIAGNOSIS — F1190 Opioid use, unspecified, uncomplicated: Secondary | ICD-10-CM

## 2023-02-08 DIAGNOSIS — R011 Cardiac murmur, unspecified: Secondary | ICD-10-CM

## 2023-02-08 HISTORY — PX: IR FLUORO GUIDE CV LINE RIGHT: IMG2283

## 2023-02-08 HISTORY — PX: IR US GUIDE VASC ACCESS RIGHT: IMG2390

## 2023-02-08 LAB — CBC
HCT: 27.3 % — ABNORMAL LOW (ref 36.0–46.0)
Hemoglobin: 7.6 g/dL — ABNORMAL LOW (ref 12.0–15.0)
MCH: 17.4 pg — ABNORMAL LOW (ref 26.0–34.0)
MCHC: 27.8 g/dL — ABNORMAL LOW (ref 30.0–36.0)
MCV: 62.5 fL — ABNORMAL LOW (ref 80.0–100.0)
Platelets: 434 10*3/uL — ABNORMAL HIGH (ref 150–400)
RBC: 4.37 MIL/uL (ref 3.87–5.11)
RDW: 20 % — ABNORMAL HIGH (ref 11.5–15.5)
WBC: 14 10*3/uL — ABNORMAL HIGH (ref 4.0–10.5)
nRBC: 0 % (ref 0.0–0.2)

## 2023-02-08 LAB — COMPREHENSIVE METABOLIC PANEL
ALT: 21 U/L (ref 0–44)
AST: 13 U/L — ABNORMAL LOW (ref 15–41)
Albumin: 2.7 g/dL — ABNORMAL LOW (ref 3.5–5.0)
Alkaline Phosphatase: 177 U/L — ABNORMAL HIGH (ref 38–126)
Anion gap: 13 (ref 5–15)
BUN: 10 mg/dL (ref 6–20)
CO2: 27 mmol/L (ref 22–32)
Calcium: 9.5 mg/dL (ref 8.9–10.3)
Chloride: 97 mmol/L — ABNORMAL LOW (ref 98–111)
Creatinine, Ser: 0.61 mg/dL (ref 0.44–1.00)
GFR, Estimated: >60 mL/min (ref >60)
Glucose, Bld: 86 mg/dL (ref 70–99)
Potassium: 3.8 mmol/L (ref 3.5–5.1)
Sodium: 137 mmol/L (ref 135–145)
Total Bilirubin: 0.6 mg/dL (ref 0.3–1.2)
Total Protein: 7.4 g/dL (ref 6.5–8.1)

## 2023-02-08 LAB — BLOOD CULTURE ID PANEL (REFLEXED) - BCID2

## 2023-02-08 LAB — RETICULOCYTES
Immature Retic Fract: 22.4 % — ABNORMAL HIGH (ref 2.3–15.9)
RBC.: 4.36 MIL/uL (ref 3.87–5.11)
Retic Count, Absolute: 70.6 10*3/uL (ref 19.0–186.0)
Retic Ct Pct: 1.6 % (ref 0.4–3.1)

## 2023-02-08 LAB — IRON AND TIBC
Iron: 13 ug/dL — ABNORMAL LOW (ref 28–170)
Saturation Ratios: 3 % — ABNORMAL LOW (ref 10.4–31.8)
TIBC: 396 ug/dL (ref 250–450)
UIBC: 383 ug/dL

## 2023-02-08 LAB — ECHOCARDIOGRAM COMPLETE
Area-P 1/2: 3.15 cm2
MV VTI: 3.59 cm2
S' Lateral: 3 cm
Single Plane A4C EF: 61.4 %

## 2023-02-08 LAB — FERRITIN: Ferritin: 17 ng/mL (ref 11–307)

## 2023-02-08 MED ORDER — METHADONE HCL 5 MG PO TABS: 165.0000 mg | ORAL_TABLET | Freq: Every day | ORAL | Status: DC

## 2023-02-08 MED ORDER — LIDOCAINE 5 % EX PTCH
1.0000 | MEDICATED_PATCH | CUTANEOUS | Status: DC
  Administered 2023-02-08 – 2023-02-14 (×7): 1 via TRANSDERMAL
  Filled 2023-02-08 (×7): qty 1

## 2023-02-08 MED ORDER — MUPIROCIN 2 % EX OINT
1.0000 | TOPICAL_OINTMENT | Freq: Two times a day (BID) | CUTANEOUS | Status: AC
  Administered 2023-02-08 – 2023-02-12 (×10): 1 via NASAL
  Filled 2023-02-08 (×2): qty 22

## 2023-02-08 MED ORDER — VANCOMYCIN HCL 2000 MG/400ML IV SOLN
2000.0000 mg | Freq: Once | INTRAVENOUS | Status: AC
  Administered 2023-02-08: 2000 mg via INTRAVENOUS
  Filled 2023-02-08: qty 400

## 2023-02-08 MED ORDER — NICOTINE 14 MG/24HR TD PT24
14.0000 mg | MEDICATED_PATCH | Freq: Every day | TRANSDERMAL | Status: DC
  Administered 2023-02-08 – 2023-02-15 (×8): 14 mg via TRANSDERMAL
  Filled 2023-02-08 (×8): qty 1

## 2023-02-08 MED ORDER — VANCOMYCIN HCL 1250 MG/250ML IV SOLN
1250.0000 mg | Freq: Two times a day (BID) | INTRAVENOUS | Status: DC
  Administered 2023-02-09 – 2023-02-11 (×5): 1250 mg via INTRAVENOUS
  Filled 2023-02-08 (×7): qty 250

## 2023-02-08 MED ORDER — MUPIROCIN 2 % EX OINT: 1.0000 | TOPICAL_OINTMENT | Freq: Two times a day (BID) | CUTANEOUS | Status: DC

## 2023-02-08 MED ORDER — ACETAMINOPHEN 500 MG PO TABS
1000.0000 mg | ORAL_TABLET | Freq: Four times a day (QID) | ORAL | Status: DC
  Administered 2023-02-08 – 2023-02-15 (×23): 1000 mg via ORAL
  Filled 2023-02-08 (×27): qty 2

## 2023-02-08 MED ORDER — OXYCODONE HCL 5 MG PO TABS
15.0000 mg | ORAL_TABLET | ORAL | Status: DC
  Administered 2023-02-08 – 2023-02-15 (×38): 15 mg via ORAL
  Filled 2023-02-08 (×38): qty 3

## 2023-02-08 MED ORDER — HYDROMORPHONE HCL 1 MG/ML IJ SOLN
1.0000 mg | Freq: Once | INTRAMUSCULAR | Status: AC
  Administered 2023-02-09: 1 mg via INTRAVENOUS
  Filled 2023-02-08: qty 1

## 2023-02-08 MED ORDER — METHADONE HCL 5 MG PO TABS
55.0000 mg | ORAL_TABLET | Freq: Three times a day (TID) | ORAL | Status: DC
  Administered 2023-02-08 – 2023-02-14 (×21): 55 mg via ORAL
  Filled 2023-02-08 (×22): qty 1

## 2023-02-08 MED ORDER — MELATONIN 5 MG PO TABS
5.0000 mg | ORAL_TABLET | Freq: Every day | ORAL | Status: DC
  Administered 2023-02-08 – 2023-02-14 (×8): 5 mg via ORAL
  Filled 2023-02-08 (×8): qty 1

## 2023-02-08 MED ORDER — HYDROMORPHONE HCL 2 MG PO TABS
1.0000 mg | ORAL_TABLET | ORAL | Status: DC | PRN
  Administered 2023-02-09: 1 mg via ORAL
  Filled 2023-02-08: qty 1

## 2023-02-08 MED ORDER — CHLORHEXIDINE GLUCONATE CLOTH 2 % EX PADS
6.0000 | MEDICATED_PAD | Freq: Every day | CUTANEOUS | Status: AC
  Administered 2023-02-09 – 2023-02-13 (×5): 6 via TOPICAL

## 2023-02-08 MED ORDER — LIDOCAINE-EPINEPHRINE 1 %-1:100000 IJ SOLN
20.0000 mL | Freq: Once | INTRAMUSCULAR | Status: AC
  Administered 2023-02-08: 7 mL via INTRADERMAL

## 2023-02-08 MED ORDER — POLYETHYLENE GLYCOL 3350 17 G PO PACK
17.0000 g | PACK | Freq: Every day | ORAL | Status: DC
  Administered 2023-02-08 – 2023-02-14 (×6): 17 g via ORAL
  Filled 2023-02-08 (×7): qty 1

## 2023-02-08 MED ORDER — DOCUSATE SODIUM 100 MG PO CAPS
100.0000 mg | ORAL_CAPSULE | Freq: Every day | ORAL | Status: DC
  Administered 2023-02-08 – 2023-02-15 (×8): 100 mg via ORAL
  Filled 2023-02-08 (×8): qty 1

## 2023-02-08 MED ORDER — METHOCARBAMOL 500 MG PO TABS
1000.0000 mg | ORAL_TABLET | Freq: Four times a day (QID) | ORAL | Status: DC
  Administered 2023-02-08 – 2023-02-15 (×29): 1000 mg via ORAL
  Filled 2023-02-08 (×30): qty 2

## 2023-02-08 MED ORDER — LIDOCAINE-EPINEPHRINE 1 %-1:100000 IJ SOLN
INTRAMUSCULAR | Status: AC
  Filled 2023-02-08: qty 1

## 2023-02-08 NOTE — Progress Notes (Signed)
  Echocardiogram 2D Echocardiogram has been performed.  Lauren Allen 02/08/2023, 12:04 PM

## 2023-02-08 NOTE — Progress Notes (Addendum)
Called to bedside for concerns about pain. Patient reports near constant pain, only a few minutes of relief despite oxycodone and dilaudid for breakthrough pain. Pain is in neck, same pain she came to hospital with. It is making her anxious and nauseous. She's well appearing on physical exam, walking in room, avoiding turning head. Impression is for inadequate basal pain control regimen. Will schedule tylenol and increase oxycodone to 15 mg and schedule every 4 hours. Increase dilaudid frequency from q6 h to q3 h but decrease dose from 2 mg to 1 mg. Overall, daily oxycodone dose increased by 50%. Daily dilaudid dose for breakthrough has not changed. Use breakthrough dilaudid use to titrate basal dose accordingly. Continue methadone and naproxen.  Update 02/09/23 6:39 AM Called around 0600 because patient is still in pain. Will increase dilaudid to 2 mg q3 h for breakthrough for time being. Day team to titrate regimen accordingly.  Marrianne Mood MD 02/08/2023, 11:47 PM

## 2023-02-08 NOTE — Plan of Care (Signed)
  Problem: Activity: Goal: Ability to perform//tolerate increased activity and mobilize with assistive devices will improve Outcome: Progressing   Problem: Clinical Measurements: Goal: Postoperative complications will be avoided or minimized Outcome: Progressing   Problem: Self-Care: Goal: Ability to meet self-care needs will improve Outcome: Progressing   Problem: Pain Management: Goal: Pain level will decrease with appropriate interventions Outcome: Progressing   Problem: Education: Goal: Knowledge of General Education information will improve Description: Including pain rating scale, medication(s)/side effects and non-pharmacologic comfort measures Outcome: Progressing   Problem: Health Behavior/Discharge Planning: Goal: Ability to manage health-related needs will improve Outcome: Progressing

## 2023-02-08 NOTE — Progress Notes (Signed)
Per ID note (Dr. Daiva Eves), pt not a candidate for PICC placement at this time. CL placed per IR on 02/08/23.

## 2023-02-08 NOTE — Progress Notes (Addendum)
Patient ID: Lauren Allen, female   DOB: 09/29/1990, 32 y.o.   MRN: [id number] There is no benefit to proceeding with operative treatment without neurological deficits, and no clear evidence that she has failed abx treatment. A three level fusion is not minor, affects her for the rest of her life, and most importantly does not provide enough benefit to the patient in the face of the risk involved. As there are no deficits, the patient only faces risks since there is no predictive model that can state the abscess would worsen on appropriate antibiotics.  Also, the threat of leaving against medical advice is a bigger reason to not place hardware since her unreliability is not limited to leaving the hospital. Her availability for follow up which is a basic requirement of surgical treatment would not coincide with her risk of never following up.

## 2023-02-08 NOTE — Plan of Care (Signed)
  Problem: Nutrition: Goal: Adequate nutrition will be maintained Outcome: Not Progressing   Problem: Activity: Goal: Risk for activity intolerance will decrease Outcome: Progressing   Problem: Clinical Measurements: Goal: Respiratory complications will improve Outcome: Progressing   Problem: Activity: Goal: Ability to perform//tolerate increased activity and mobilize with assistive devices will improve Outcome: Progressing

## 2023-02-08 NOTE — Progress Notes (Addendum)
Subjective: Patient was seen and examined at bedside. She still has complaints of diffuse neck pain and radiation to the back that has not changed since admission. Patient is unable to sit comfortably due to pain. Discussed IV access issue with patient and plan to consult IR. Denies any fever, chills, chest pain, upper extremity weakness, loss of sensation, abdominal pain, nausea, vomiting, dysuria, or vaginal discharge.  Objective:  Vital signs in last 24 hours: Vitals:   02/07/23 1306 02/07/23 1526 02/07/23 2055 02/08/23 0759  BP: (!) 141/92 139/89 95/60 111/71  Pulse: (!) 103 95 82 80  Resp: 16 16 20 19   Temp: 98.2 F (36.8 C) 98.6 F (37 C) 99 F (37.2 C) (!) 97.5 F (36.4 C)  TempSrc:  Oral  Oral  SpO2: (!) 83% 96% 96% 98%   Weight change:   Intake/Output Summary (Last 24 hours) at 02/08/2023 1125 Last data filed at 02/08/2023 0400 Gross per 24 hour  Intake 480 ml  Output --  Net 480 ml   Physical Exam Constitutional:      Appearance: She is obese.  HENT:     Head: Normocephalic and atraumatic.     Mouth/Throat:     Mouth: Mucous membranes are moist.  Cardiovascular:     Rate and Rhythm: Regular rhythm. Tachycardia present.     Pulses: Normal pulses.     Heart sounds: Normal heart sounds.  Pulmonary:     Effort: Pulmonary effort is normal.     Breath sounds: Normal breath sounds.  Abdominal:     General: Abdomen is flat. There is no distension.     Palpations: There is no mass.     Tenderness: There is no abdominal tenderness. There is no guarding or rebound.  Musculoskeletal:        General: No swelling.     Cervical back: Neck supple. Tenderness present. No edema or erythema. Pain with movement and muscular tenderness present. No spinous process tenderness.  Skin:    General: Skin is warm.  Neurological:     Mental Status: She is alert and oriented to person, place, and time.     Sensory: No sensory deficit.     Motor: No weakness.  Psychiatric:         Mood and Affect: Mood is anxious.      Assessment/Plan:  Principal Problem:   Epidural abscess Patient is a 32 year old female with PMH of GAD, MDD, tobacco use, opioid use disorder, chronic bilateral UE wound, and Crohn's disease who presented with neck pain radiating to the back. Patient admitted for an epidural abscess from C4-C7.   Epidural Abscess C4-C7 Secondary to MRSA Bacteremia Patient has been afebrile with complaints of neck pain along with muscle tightness. Leukocytosis on arrival of 17.8 that is continuing to improve. Lactic acid is 0.4. Cervical MRI showed epidural phlegmonous inflammation from upper C4 to upper C7. Effacement of the subarachnoid space surrounding the cord with flattening of the cord. No evidence of osteomyelitis. Possible posterior discitis at C5-6 which has decompressed into the epidural space, not definitely the origin  -Neurosurgery was consulted and does not recommend surgical intervention at this time. -ID consulted and recommend IV Vancomycin and Ceftriaxone and PO Metronidazole. IV access not obtainable due to bilateral UE wounds. Patient bridged with Zyvok, Cipro, and Flagyl until IV access is achieved.  -Pt screened for HIV and negative -Blood and urine cultures drawn 10/16. Blood culture x3 grew MRSA. ID recommends continuing with IV Vancomycin -  TTE ordered to r/o infective endocarditis. TTE is normal. -Continue to trend CBC -Pain control with stepwise therapy: Tylenol 1000 mg  Q4 PRN for mild pain, Percocet/Roxicet 5-325 mg for severe pain 1-2 tablets Q4 PRN, Naproxen 200 mg BID, Dilaudid 2 mg Q6 PRN for breakthrough pain -Lidocaine patch for neck pain -Robaxin 1000 QID for muscle tightness -IR consulted for tunneled catheter placement  Opioid Use Disorder Patient has a history of heroin use. Confirmed Methadone dosage of 165 mg from [facility]. Last IV drug use was months ago. -Continue Methadone 55 mg TID  Chronic Skin  Wounds, Bilateral UE Hx of Anxiety Hx of Depression Has bilateral skin wounds on the forearms that has required debridement and skin grafting in the past. Patient reports skin picking due to anxiety. -Wound care consulted and applying topical treatments and bandages. WC recommends patient continue her OP follow-up with Lauren Allen  Microcytic Anemia Hgb has been downtrending for the past four months. Most recent Hgb was 7.6. MCV is 61.6. Iron 13, TIBC 3, and Ferritin 17. -Patient likely has iron deficiency anemia. Recommend iron supplementation outpatient -Will transfuse if Hgb <7  Trichomoniasis Positive urine analysis but patient denies any recent sexual activity or prior infection. -Patient is already covered with Flagyl treatment. Will continue treatment.  Thrombocytosis Platelet 489 on admission and continuing to decrease. Likely elevated in the setting of infection. Platelet smear shows normal morphology. -Will continue to monitor  Tobacco Use Disorder Patient smokes 1/4 ppd for roughly 16 years -Nicotine patch 14 mg  Intermittent Hypoxia while Sleeping Patient does not have a formal diagnosis of OSA. STOP BANG score is 3. She endorses some daytime fatigue, headache, and heavy snoring during sleep. -Recommend sleep studies outpatient -Supplemental O2 at bedtime PRN  Diet: Regular  VTE: Heparin Code: Full   Prior to Admission Living Arrangement: Home Barriers to Discharge: Medical work-up  Signature: Lauren Allen, MS4  LOS: 1 day   Lauren Allen, Medical Student 02/08/2023, 11:25 AM

## 2023-02-08 NOTE — Progress Notes (Addendum)
Patient had Vancomycin 1250 mg IV medication schedule this morning unable to give due to no IV assess. Provider Dr. Carlynn Purl internal medicine doctor notified and made aware. No new orders. Per report yesterday patient to have a PICC line placed today.

## 2023-02-08 NOTE — Procedures (Signed)
Interventional Radiology Procedure Note  Procedure: Placement of a right IJ approach double lumen cuffed tunneled CVC.   Tip is positioned at the superior cavoatrial junction and catheter is ready for immediate use.  Complications: None Recommendations:  - Ok to use - Do not submerge - Routine line care   Signed,  Yvone Neu. Loreta Ave, DO, ABVM, RPVI

## 2023-02-08 NOTE — Progress Notes (Addendum)
PHARMACY - PHYSICIAN COMMUNICATION CRITICAL VALUE ALERT - BLOOD CULTURE IDENTIFICATION (BCID)  Lauren Allen is an 32 y.o. female who presented to [facility] on 02/07/2023 with a chief complaint of neck pain  Assessment:  31 YOF with cervical epidural abscess, possible discitis now with 2 of 4 bottles growing GPC in clusters with BCID detecting MRSA.  Name of physician (or Provider) Contacted: Daiva Eves (ID consulted)  Current antibiotics: Linezolid + Cipro (loss IV access)  Changes to prescribed antibiotics recommended:  Continue Linezolid until IV access established (currently in IR for placement). Once established transition to Vancomycin dosing per pharmacy protocol - resume at prior estimated dosing of 2g LD x 1 followed by 1250 mg IV every 12 hours (goal trough 15-20 mcg/ml)  Results for orders placed or performed during the hospital encounter of 02/07/23  Blood Culture ID Panel (Reflexed) (Collected: 02/07/2023 12:20 PM)  Result Value Ref Range   Enterococcus faecalis NOT DETECTED NOT DETECTED   Enterococcus Faecium NOT DETECTED NOT DETECTED   Listeria monocytogenes NOT DETECTED NOT DETECTED   Staphylococcus species DETECTED (A) NOT DETECTED   Staphylococcus aureus (BCID) DETECTED (A) NOT DETECTED   Staphylococcus epidermidis NOT DETECTED NOT DETECTED   Staphylococcus lugdunensis NOT DETECTED NOT DETECTED   Streptococcus species NOT DETECTED NOT DETECTED   Streptococcus agalactiae NOT DETECTED NOT DETECTED   Streptococcus pneumoniae NOT DETECTED NOT DETECTED   Streptococcus pyogenes NOT DETECTED NOT DETECTED   A.calcoaceticus-baumannii NOT DETECTED NOT DETECTED   Bacteroides fragilis NOT DETECTED NOT DETECTED   Enterobacterales NOT DETECTED NOT DETECTED   Enterobacter cloacae complex NOT DETECTED NOT DETECTED   Escherichia coli NOT DETECTED NOT DETECTED   Klebsiella aerogenes NOT DETECTED NOT DETECTED   Klebsiella oxytoca NOT DETECTED NOT DETECTED   Klebsiella pneumoniae  NOT DETECTED NOT DETECTED   Proteus species NOT DETECTED NOT DETECTED   Salmonella species NOT DETECTED NOT DETECTED   Serratia marcescens NOT DETECTED NOT DETECTED   Haemophilus influenzae NOT DETECTED NOT DETECTED   Neisseria meningitidis NOT DETECTED NOT DETECTED   Pseudomonas aeruginosa NOT DETECTED NOT DETECTED   Stenotrophomonas maltophilia NOT DETECTED NOT DETECTED   Candida albicans NOT DETECTED NOT DETECTED   Candida auris NOT DETECTED NOT DETECTED   Candida glabrata NOT DETECTED NOT DETECTED   Candida krusei NOT DETECTED NOT DETECTED   Candida parapsilosis NOT DETECTED NOT DETECTED   Candida tropicalis NOT DETECTED NOT DETECTED   Cryptococcus neoformans/gattii NOT DETECTED NOT DETECTED   Meth resistant mecA/C and MREJ DETECTED (A) NOT DETECTED    Thank you for allowing pharmacy to be a part of this patient's care.  Georgina Pillion, PharmD, BCPS, BCIDP Infectious Diseases Clinical Pharmacist 02/08/2023 4:00 PM   **Pharmacist phone directory can now be found on [email or web address] ([email or web address]).  Listed under [facility].

## 2023-02-09 DIAGNOSIS — F1990 Other psychoactive substance use, unspecified, uncomplicated: Secondary | ICD-10-CM

## 2023-02-09 DIAGNOSIS — S41101D Unspecified open wound of right upper arm, subsequent encounter: Secondary | ICD-10-CM

## 2023-02-09 DIAGNOSIS — B9562 Methicillin resistant Staphylococcus aureus infection as the cause of diseases classified elsewhere: Secondary | ICD-10-CM

## 2023-02-09 DIAGNOSIS — R7881 Bacteremia: Secondary | ICD-10-CM

## 2023-02-09 LAB — CBC WITH DIFFERENTIAL/PLATELET
Abs Immature Granulocytes: 0.16 10*3/uL — ABNORMAL HIGH (ref 0.00–0.07)
Basophils Absolute: 0 10*3/uL (ref 0.0–0.1)
Basophils Relative: 0 %
Eosinophils Absolute: 0.1 10*3/uL (ref 0.0–0.5)
Eosinophils Relative: 1 %
HCT: 27.1 % — ABNORMAL LOW (ref 36.0–46.0)
Hemoglobin: 7.3 g/dL — ABNORMAL LOW (ref 12.0–15.0)
Immature Granulocytes: 1 %
Lymphocytes Relative: 17 %
Lymphs Abs: 2.3 10*3/uL (ref 0.7–4.0)
MCH: 16.6 pg — ABNORMAL LOW (ref 26.0–34.0)
MCHC: 26.9 g/dL — ABNORMAL LOW (ref 30.0–36.0)
MCV: 61.6 fL — ABNORMAL LOW (ref 80.0–100.0)
Monocytes Absolute: 0.6 10*3/uL (ref 0.1–1.0)
Monocytes Relative: 4 %
Neutro Abs: 10.6 10*3/uL — ABNORMAL HIGH (ref 1.7–7.7)
Neutrophils Relative %: 77 %
Platelets: 502 10*3/uL — ABNORMAL HIGH (ref 150–400)
RBC: 4.4 MIL/uL (ref 3.87–5.11)
RDW: 20.2 % — ABNORMAL HIGH (ref 11.5–15.5)
WBC: 13.8 10*3/uL — ABNORMAL HIGH (ref 4.0–10.5)
nRBC: 0 % (ref 0.0–0.2)

## 2023-02-09 LAB — HEPATITIS C ANTIBODY: HCV Ab: NONREACTIVE

## 2023-02-09 LAB — HEPATITIS A ANTIBODY, TOTAL: hep A Total Ab: NONREACTIVE

## 2023-02-09 LAB — HIV ANTIBODY (ROUTINE TESTING W REFLEX): HIV Screen 4th Generation wRfx: NONREACTIVE

## 2023-02-09 MED ORDER — HYDROMORPHONE HCL 2 MG PO TABS
2.0000 mg | ORAL_TABLET | ORAL | Status: DC | PRN
  Administered 2023-02-09 – 2023-02-15 (×18): 2 mg via ORAL
  Filled 2023-02-09 (×18): qty 1

## 2023-02-09 MED ORDER — METRONIDAZOLE 500 MG PO TABS
500.0000 mg | ORAL_TABLET | Freq: Two times a day (BID) | ORAL | Status: DC
  Administered 2023-02-09 – 2023-02-13 (×9): 500 mg via ORAL
  Filled 2023-02-09 (×9): qty 1

## 2023-02-09 MED ORDER — TRAZODONE HCL 50 MG PO TABS
50.0000 mg | ORAL_TABLET | Freq: Every day | ORAL | Status: DC
  Administered 2023-02-09 – 2023-02-14 (×6): 50 mg via ORAL
  Filled 2023-02-09 (×6): qty 1

## 2023-02-09 MED ORDER — KETOROLAC TROMETHAMINE 30 MG/ML IJ SOLN
30.0000 mg | Freq: Four times a day (QID) | INTRAMUSCULAR | Status: DC
  Administered 2023-02-09 – 2023-02-10 (×2): 30 mg via INTRAVENOUS
  Filled 2023-02-09 (×2): qty 1

## 2023-02-09 MED ORDER — MIRTAZAPINE 30 MG PO TBDP
30.0000 mg | ORAL_TABLET | Freq: Every evening | ORAL | Status: DC | PRN
  Administered 2023-02-09 – 2023-02-10 (×2): 30 mg via ORAL
  Filled 2023-02-09 (×3): qty 1

## 2023-02-09 MED ORDER — NALOXONE HCL 0.4 MG/ML IJ SOLN: 0.4000 mg | INTRAMUSCULAR | Status: DC | PRN

## 2023-02-09 NOTE — Progress Notes (Addendum)
Subjective:  Patient was seen and examined at bedside. Last night she had severe neck pain and was unable to get sleep. Patient expressed that her pain was making her more anxious and also not helping her with sleep. The night team increased her Oxycodone to 15 mg and her Dilaudid to 2 mg Q3. We discussed the risk of respiratory suppression with further increase of these medications and patient is understanding. We updated the patient on her positive MRSA blood cultures and how this is likely the source of her epidural abscess. Patient states that her pain this morning is improved from last night.  Objective:  Vital signs in last 24 hours: Vitals:   02/08/23 0759 02/08/23 1645 02/08/23 2026 02/09/23 0509  BP: 111/71 135/82 131/86 123/78  Pulse: 80 98 93 93  Resp: 19 16 16 20   Temp: (!) 97.5 F (36.4 C)   98 F (36.7 C)  TempSrc: Oral   Oral  SpO2: 98% 100% 100% 97%   Weight change:   Intake/Output Summary (Last 24 hours) at 02/09/2023 0725 Last data filed at 02/09/2023 0300 Gross per 24 hour  Intake 480 ml  Output --  Net 480 ml   Physical Exam Constitutional:      Appearance: She is obese.  HENT:     Head: Normocephalic.  Neck:     Comments: Patient restricted in neck motion due to pain Cardiovascular:     Rate and Rhythm: Regular rhythm. Tachycardia present.     Pulses: Normal pulses.     Heart sounds: Normal heart sounds.  Pulmonary:     Effort: Pulmonary effort is normal.  Abdominal:     General: Abdomen is flat.  Musculoskeletal:        General: No swelling.     Cervical back: Decreased range of motion.  Skin:    General: Skin is warm.     Comments: Patient has bilateral UE wounds wrapped.  Neurological:     General: No focal deficit present.     Mental Status: She is alert.  Psychiatric:        Mood and Affect: Mood is anxious.       Assessment/Plan: Principal Problem:  Epidural abscess Patient is a 32 year old female with PMH of GAD, MDD, tobacco  use, opioid use disorder, chronic bilateral UE wound, and Crohn's disease who presented with neck pain radiating to the back. Patient admitted for an epidural abscess from C4-C7 and blood cultures from 10/16 grew MRSA.   Epidural Abscess C4-C7 Secondary to MRSA Bacteremia Patient has been afebrile with complaints of neck pain along with muscle tightness. Leukocytosis on arrival of 17.8 that is continuing to improve. Lactic acid is 0.4. Cervical MRI showed epidural phlegmonous inflammation from upper C4 to upper C7. Effacement of the subarachnoid space surrounding the cord with flattening of the cord. No evidence of osteomyelitis. Possible posterior discitis at C5-6 which has decompressed into the epidural space, not definitely the origin  -Neurosurgery was consulted and does not recommend surgical intervention at this time. -Pt screened for HIV and negative -Blood and urine cultures drawn 10/16. Blood culture x3 grew MRSA. ID recommends continuing with IV Vancomycin for at least one week before attempting line holiday -TTE ordered to r/o infective endocarditis. TTE is normal. ID does not recommend TEE due to risk of paralysis given epidural abscess. -Continue to trend CBC -Pain control with stepwise therapy: Tylenol 1000 mg  Q4 PRN for mild pain, Oxycodone 15 mg Q4 PRN for  severe pain 1-2 tablets Q4 PRN, Naproxen 200 mg BID, Dilaudid 2 mg Q3 PRN for breakthrough pain -Also on methadone 55 mg tid (home dose 165 mg daily) -Lidocaine patch for neck pain -Robaxin 1000 QID for muscle tightness -IR consulted for tunneled catheter placement and completed 10/17. Patient on IV Vancomycin.   Opioid Use Disorder Patient has a history of heroin use. Confirmed Methadone dosage of 165 mg from [facility]. Last IV drug use was 11/2022. -Continue Methadone 55 mg TID   Chronic Skin Wounds, Bilateral UE Hx of Anxiety Hx of Depression Has bilateral skin wounds on the forearms that has required  debridement and skin grafting in the past. Patient reports skin picking due to anxiety. -Wound care consulted and applying topical treatments and bandages. WC recommends patient continue her OP follow-up with Dr. Lajoyce Corners   Microcytic Anemia Hgb has been downtrending for the past four months. Most recent Hgb was 7.6. MCV is 61.6. Iron 13, TIBC 3, and Ferritin 17. -Patient likely has iron deficiency anemia. Recommend iron supplementation outpatient -Will transfuse if Hgb <7 -Contacting phlebotomy for labs   Trichomoniasis Positive urine analysis but patient denies any recent sexual activity or prior infection. -Patient is already covered with Flagyl treatment. Will continue Metronidazole 500 po BID treatment.   Thrombocytosis Platelet 489 on admission and continuing to decrease. Likely elevated in the setting of infection. Platelet smear shows normal morphology. -Will continue to monitor   Tobacco Use Disorder Patient smokes 1/4 ppd for roughly 16 years -Nicotine patch 14 mg   Intermittent Hypoxia while Sleeping Patient does not have a formal diagnosis of OSA. STOP BANG score is 3. She endorses some daytime fatigue, headache, and heavy snoring during sleep. -Recommend sleep studies outpatient -Supplemental O2 at bedtime PRN   Diet: Regular  VTE: Heparin Code: Full   Prior to Admission Living Arrangement: Home Barriers to Discharge: Medical work-up   Signature: Scherrie November, MS4   LOS: 2 days   Scherrie November, Medical Student 02/09/2023, 7:25 AM

## 2023-02-09 NOTE — Plan of Care (Signed)
  Problem: Pain Management: Goal: Pain level will decrease with appropriate interventions Outcome: Progressing   Problem: Activity: Goal: Risk for activity intolerance will decrease Outcome: Progressing   Problem: Nutrition: Goal: Adequate nutrition will be maintained Outcome: Progressing   

## 2023-02-09 NOTE — Progress Notes (Signed)
Pt has her own personal heating pad from home at bedside.

## 2023-02-09 NOTE — Progress Notes (Signed)
Subjective: Much more comfortable than she was last night  Antibiotics:  Anti-infectives (From admission, onward)    Start     Dose/Rate Route Frequency Ordered Stop   02/09/23 1000  metroNIDAZOLE (FLAGYL) tablet 500 mg        500 mg Oral Every 12 hours 02/09/23 0754 02/15/23 0959   02/09/23 0600  vancomycin (VANCOREADY) IVPB 1250 mg/250 mL        1,250 mg 166.7 mL/hr over 90 Minutes Intravenous Every 12 hours 02/08/23 1644     02/08/23 1730  vancomycin (VANCOREADY) IVPB 2000 mg/400 mL        2,000 mg 200 mL/hr over 120 Minutes Intravenous  Once 02/08/23 1644 02/08/23 1916   02/08/23 1000  cefTRIAXone (ROCEPHIN) 2 g in sodium chloride 0.9 % 100 mL IVPB  Status:  Discontinued        2 g 200 mL/hr over 30 Minutes Intravenous Every 24 hours 02/07/23 1353 02/07/23 1408   02/08/23 1000  cefTRIAXone (ROCEPHIN) 2 g in sodium chloride 0.9 % 100 mL IVPB  Status:  Discontinued        2 g 200 mL/hr over 30 Minutes Intravenous Every 12 hours 02/07/23 1408 02/08/23 1559   02/08/23 0400  vancomycin (VANCOREADY) IVPB 1250 mg/250 mL  Status:  Discontinued        1,250 mg 166.7 mL/hr over 90 Minutes Intravenous Every 12 hours 02/07/23 1410 02/08/23 1559   02/07/23 2200  linezolid (ZYVOX) tablet 600 mg  Status:  Discontinued        600 mg Oral Every 12 hours 02/07/23 1723 02/08/23 1644   02/07/23 2000  ciprofloxacin (CIPRO) tablet 750 mg  Status:  Discontinued        750 mg Oral 2 times daily 02/07/23 1723 02/08/23 1559   02/07/23 1800  metroNIDAZOLE (FLAGYL) tablet 500 mg  Status:  Discontinued        500 mg Oral Every 12 hours 02/07/23 1353 02/08/23 1559   02/07/23 1445  cefTRIAXone (ROCEPHIN) 1 g in sodium chloride 0.9 % 100 mL IVPB  Status:  Discontinued        1 g 200 mL/hr over 30 Minutes Intravenous  Once 02/07/23 1353 02/08/23 1559   02/07/23 1215  vancomycin (VANCOREADY) IVPB 2000 mg/400 mL  Status:  Discontinued        2,000 mg 200 mL/hr over 120 Minutes Intravenous  Once  02/07/23 1210 02/08/23 0859   02/07/23 1100  metroNIDAZOLE (FLAGYL) IVPB 500 mg  Status:  Discontinued        500 mg 100 mL/hr over 60 Minutes Intravenous Every 6 hours 02/07/23 1046 02/07/23 1353   02/07/23 1100  cefTRIAXone (ROCEPHIN) 1 g in sodium chloride 0.9 % 100 mL IVPB        1 g 200 mL/hr over 30 Minutes Intravenous  Once 02/07/23 1046 02/07/23 1157       Medications: Scheduled Meds:  acetaminophen  1,000 mg Oral Q6H   Chlorhexidine Gluconate Cloth  6 each Topical Q0600   docusate sodium  100 mg Oral Daily   heparin  5,000 Units Subcutaneous Q8H   lidocaine  1 patch Transdermal Q24H   melatonin  5 mg Oral QHS   methadone  55 mg Oral TID   methocarbamol  1,000 mg Oral QID   metroNIDAZOLE  500 mg Oral Q12H   mupirocin ointment  1 Application Nasal BID   naproxen  500 mg Oral BID WC   nicotine  14 mg Transdermal Daily   oxyCODONE  15 mg Oral Q4H   polyethylene glycol  17 g Oral Daily   Continuous Infusions:  vancomycin 1,250 mg (02/09/23 0529)   PRN Meds:.HYDROmorphone, mirtazapine, naLOXone (NARCAN)  injection    Objective: Weight change:   Intake/Output Summary (Last 24 hours) at 02/09/2023 1313 Last data filed at 02/09/2023 0300 Gross per 24 hour  Intake 480 ml  Output --  Net 480 ml   Blood pressure (!) 101/45, pulse 83, temperature 97.8 F (36.6 C), temperature source Oral, resp. rate 16, last menstrual period 01/09/2023, SpO2 94%. Temp:  [97.8 F (36.6 C)-98 F (36.7 C)] 97.8 F (36.6 C) (10/18 0809) Pulse Rate:  [83-98] 83 (10/18 0809) Resp:  [16-20] 16 (10/18 0809) BP: (101-135)/(45-86) 101/45 (10/18 0809) SpO2:  [94 %-100 %] 94 % (10/18 0809)  Physical Exam: Physical Exam Constitutional:      General: She is not in acute distress.    Appearance: She is well-developed. She is not diaphoretic.  HENT:     Head: Normocephalic and atraumatic.     Right Ear: External ear normal.     Left Ear: External ear normal.     Mouth/Throat:      Pharynx: No oropharyngeal exudate.  Eyes:     General: No scleral icterus.    Conjunctiva/sclera: Conjunctivae normal.     Pupils: Pupils are equal, round, and reactive to light.  Cardiovascular:     Rate and Rhythm: Normal rate and regular rhythm.     Heart sounds: No murmur heard.    No gallop.  Pulmonary:     Effort: Pulmonary effort is normal. No respiratory distress.     Breath sounds: No wheezing or rales.  Abdominal:     General: There is no distension.     Palpations: Abdomen is soft.  Musculoskeletal:        General: No tenderness. Normal range of motion.  Lymphadenopathy:     Cervical: No cervical adenopathy.  Skin:    General: Skin is warm and dry.     Coloration: Skin is not pale.  Neurological:     General: No focal deficit present.     Mental Status: She is alert and oriented to person, place, and time.     Motor: No abnormal muscle tone.  Psychiatric:        Mood and Affect: Mood normal.        Behavior: Behavior normal.        Thought Content: Thought content normal.        Judgment: Judgment normal.     Wounds wrapped  CBC:    BMET Recent Labs    02/07/23 0828 02/08/23 0658  NA 136 137  K 4.0 3.8  CL 95* 97*  CO2 27 27  GLUCOSE 101* 86  BUN 7 10  CREATININE 0.58 0.61  CALCIUM 9.6 9.5     Liver Panel  Recent Labs    02/08/23 0658  PROT 7.4  ALBUMIN 2.7*  AST 13*  ALT 21  ALKPHOS 177*  BILITOT 0.6       Sedimentation Rate No results for input(s): "ESRSEDRATE" in the last 72 hours. C-Reactive Protein No results for input(s): "CRP" in the last 72 hours.  Micro Results: Recent Results (from the past 720 hour(s))  Blood culture (routine x 2)     Status: Abnormal (Preliminary result)   Collection Time: 02/07/23 12:20 PM   Specimen: BLOOD LEFT ARM  Result Value Ref Range  Status   Specimen Description BLOOD LEFT ARM  Final   Special Requests   Final    BOTTLES DRAWN AEROBIC AND ANAEROBIC Blood Culture adequate volume    Culture  Setup Time   Final    GRAM POSITIVE COCCI IN CLUSTERS IN BOTH AEROBIC AND ANAEROBIC BOTTLES CRITICAL RESULT CALLED TO, READ BACK BY AND VERIFIED WITH: PHARMD C. AHMEND [id number] @ 1542 FH    Culture (A)  Final    STAPHYLOCOCCUS AUREUS SUSCEPTIBILITIES TO FOLLOW Performed at [facility], [address], [place], [place] [address]    Report Status PENDING  Incomplete  Blood Culture ID Panel (Reflexed)     Status: Abnormal   Collection Time: 02/07/23 12:20 PM  Result Value Ref Range Status   Enterococcus faecalis NOT DETECTED NOT DETECTED Final   Enterococcus Faecium NOT DETECTED NOT DETECTED Final   Listeria monocytogenes NOT DETECTED NOT DETECTED Final   Staphylococcus species DETECTED (A) NOT DETECTED Final    Comment: CRITICAL RESULT CALLED TO, READ BACK BY AND VERIFIED WITH: PHARMD C. AHMEND [id number] @ 1542 FH    Staphylococcus aureus (BCID) DETECTED (A) NOT DETECTED Final    Comment: Methicillin (oxacillin)-resistant Staphylococcus aureus (MRSA). MRSA is predictably resistant to beta-lactam antibiotics (except ceftaroline). Preferred therapy is vancomycin unless clinically contraindicated. Patient requires contact precautions if  hospitalized. CRITICAL RESULT CALLED TO, READ BACK BY AND VERIFIED WITH: PHARMD C. AHMEND [id number] @ 1542 FH    Staphylococcus epidermidis NOT DETECTED NOT DETECTED Final   Staphylococcus lugdunensis NOT DETECTED NOT DETECTED Final   Streptococcus species NOT DETECTED NOT DETECTED Final   Streptococcus agalactiae NOT DETECTED NOT DETECTED Final   Streptococcus pneumoniae NOT DETECTED NOT DETECTED Final   Streptococcus pyogenes NOT DETECTED NOT DETECTED Final   A.calcoaceticus-baumannii NOT DETECTED NOT DETECTED Final   Bacteroides fragilis NOT DETECTED NOT DETECTED Final   Enterobacterales NOT DETECTED NOT DETECTED Final   Enterobacter cloacae complex NOT DETECTED NOT DETECTED Final   Escherichia coli NOT DETECTED NOT DETECTED Final    Klebsiella aerogenes NOT DETECTED NOT DETECTED Final   Klebsiella oxytoca NOT DETECTED NOT DETECTED Final   Klebsiella pneumoniae NOT DETECTED NOT DETECTED Final   Proteus species NOT DETECTED NOT DETECTED Final   Salmonella species NOT DETECTED NOT DETECTED Final   Serratia marcescens NOT DETECTED NOT DETECTED Final   Haemophilus influenzae NOT DETECTED NOT DETECTED Final   Neisseria meningitidis NOT DETECTED NOT DETECTED Final   Pseudomonas aeruginosa NOT DETECTED NOT DETECTED Final   Stenotrophomonas maltophilia NOT DETECTED NOT DETECTED Final   Candida albicans NOT DETECTED NOT DETECTED Final   Candida auris NOT DETECTED NOT DETECTED Final   Candida glabrata NOT DETECTED NOT DETECTED Final   Candida krusei NOT DETECTED NOT DETECTED Final   Candida parapsilosis NOT DETECTED NOT DETECTED Final   Candida tropicalis NOT DETECTED NOT DETECTED Final   Cryptococcus neoformans/gattii NOT DETECTED NOT DETECTED Final   Meth resistant mecA/C and MREJ DETECTED (A) NOT DETECTED Final    Comment: CRITICAL RESULT CALLED TO, READ BACK BY AND VERIFIED WITH: PHARMD C. AHMEND [id number] @ [phone] FH Performed at [facility], [address] [place], [place] [address]   Blood culture (routine x 2)     Status: Abnormal (Preliminary result)   Collection Time: 02/07/23  2:17 PM   Specimen: BLOOD RIGHT HAND  Result Value Ref Range Status   Specimen Description BLOOD RIGHT HAND  Final   Special Requests   Final  BOTTLES DRAWN AEROBIC AND ANAEROBIC Blood Culture adequate volume   Culture  Setup Time   Final    GRAM POSITIVE COCCI IN BOTH AEROBIC AND ANAEROBIC BOTTLES CRITICAL RESULT CALLED TO, READ BACK BY AND VERIFIED WITH: PHARMD C. AHMEND [id number] @ [phone] FH Performed at [facility], [address], [place], [place] [address]    Culture STAPHYLOCOCCUS AUREUS (A)  Final   Report Status PENDING  Incomplete    Studies/Results: IR Fluoro Guide CV Line Right  Result Date:  02/09/2023 INDICATION: 32 year old female referred for tunneled central venous catheter EXAM: IMAGE GUIDED TUNNELED CENTRAL VENOUS CATHETER MEDICATIONS: None ANESTHESIA/SEDATION: None FLUOROSCOPY: Radiation Exposure Index (as provided by the fluoroscopic device): 2 mGy Kerma COMPLICATIONS: None PROCEDURE: Informed written consent was obtained from the patient after a thorough discussion of the procedural risks, benefits and alternatives. All questions were addressed. Maximal Sterile Barrier Technique was utilized including caps, mask, sterile gowns, sterile gloves, sterile drape, hand hygiene and skin antiseptic. A timeout was performed prior to the initiation of the procedure. After written informed consent was obtained, patient was placed in the supine position on angiographic table. Patency of the right internal jugular vein was confirmed with ultrasound with image documentation. Patient was prepped and draped in the usual sterile fashion including the right neck and right superior chest. Using ultrasound guidance, the skin and subcutaneous tissues overlying the right internal jugular vein were generously infiltrated with 1% lidocaine without epinephrine. Using ultrasound guidance, the right internal jugular vein was punctured with a micropuncture needle, and an 018 wire was advanced into the right heart confirming venous access. A small stab incision was made with an 11 blade scalpel. Peel-away sheath was placed over the wire, and then the wire was removed, marking the wire for estimation of internal catheter length. The chest wall was then generously infiltrated with 1% lidocaine for local anesthesia along the tissue tract. Small stab incision was made with 11 blade scalpel, and then the catheter was back tunneled to the puncture site at the right internal jugular vein. Catheter was pulled through the tract, with the catheter amputated at 22 cm. Catheter was advanced through the peel-away sheath, and the  peel-away sheath was removed. Final image was stored. The catheter was anchored to the chest wall with 2 retention sutures, and Derma bond was used to seal the right internal jugular vein incision site and at the right chest wall. Patient tolerated the procedure well and remained hemodynamically stable throughout. No complications were encountered and no significant blood loss was encountered. IMPRESSION: Status post image guided right IJ tunneled central venous catheter. Signed, Yvone Neu. Miachel Roux, RPVI [facility] Electronically Signed   By: Gilmer Mor D.O.   On: 02/09/2023 08:14   IR US Guide Vasc Access Right  Result Date: 02/09/2023 INDICATION: 32 year old female referred for tunneled central venous catheter EXAM: IMAGE GUIDED TUNNELED CENTRAL VENOUS CATHETER MEDICATIONS: None ANESTHESIA/SEDATION: None FLUOROSCOPY: Radiation Exposure Index (as provided by the fluoroscopic device): 2 mGy Kerma COMPLICATIONS: None PROCEDURE: Informed written consent was obtained from the patient after a thorough discussion of the procedural risks, benefits and alternatives. All questions were addressed. Maximal Sterile Barrier Technique was utilized including caps, mask, sterile gowns, sterile gloves, sterile drape, hand hygiene and skin antiseptic. A timeout was performed prior to the initiation of the procedure. After written informed consent was obtained, patient was placed in the supine position on angiographic table. Patency of the right internal  jugular vein was confirmed with ultrasound with image documentation. Patient was prepped and draped in the usual sterile fashion including the right neck and right superior chest. Using ultrasound guidance, the skin and subcutaneous tissues overlying the right internal jugular vein were generously infiltrated with 1% lidocaine without epinephrine. Using ultrasound guidance, the right internal jugular vein was  punctured with a micropuncture needle, and an 018 wire was advanced into the right heart confirming venous access. A small stab incision was made with an 11 blade scalpel. Peel-away sheath was placed over the wire, and then the wire was removed, marking the wire for estimation of internal catheter length. The chest wall was then generously infiltrated with 1% lidocaine for local anesthesia along the tissue tract. Small stab incision was made with 11 blade scalpel, and then the catheter was back tunneled to the puncture site at the right internal jugular vein. Catheter was pulled through the tract, with the catheter amputated at 22 cm. Catheter was advanced through the peel-away sheath, and the peel-away sheath was removed. Final image was stored. The catheter was anchored to the chest wall with 2 retention sutures, and Derma bond was used to seal the right internal jugular vein incision site and at the right chest wall. Patient tolerated the procedure well and remained hemodynamically stable throughout. No complications were encountered and no significant blood loss was encountered. IMPRESSION: Status post image guided right IJ tunneled central venous catheter. Signed, Yvone Neu. Miachel Roux, RPVI [facility] Electronically Signed   By: Gilmer Mor D.O.   On: 02/09/2023 08:14   ECHOCARDIOGRAM COMPLETE  Result Date: 02/08/2023    ECHOCARDIOGRAM REPORT   Patient Name:   Lauren Allen Date of Exam: 02/08/2023 Medical Rec #:  [id number]        Height:       63.0 in Accession #:    [id number]       Weight:       230.0 lb Date of Birth:  [birth year]       BSA:          2.052 m Patient Age:    31 years         BP:           111/71 mmHg Patient Gender: F                HR:           85 bpm. Exam Location:  Inpatient Procedure: 2D Echo, Cardiac Doppler and Color Doppler Indications:    Murmur  History:        Patient has no prior history of Echocardiogram  examinations.                 Opiate addiction.  Sonographer:    Milda Smart Referring Phys: [id number] GRACE LAU  Sonographer Comments: Image acquisition challenging due to respiratory motion and Image acquisition challenging due to patient body habitus. IMPRESSIONS  1. Left ventricular ejection fraction, by estimation, is 60 to 65%. The left ventricle has normal function. The left ventricle has no regional wall motion abnormalities. Left ventricular diastolic parameters were normal.  2. Right ventricular systolic function is normal. The right ventricular size is normal.  3. The mitral valve is normal in structure. No evidence of mitral valve regurgitation. No evidence of mitral stenosis.  4. The aortic valve is normal in structure. Aortic valve regurgitation is not visualized. No aortic stenosis is present.  5. The inferior  vena cava is normal in size with greater than 50% respiratory variability, suggesting right atrial pressure of 3 mmHg. FINDINGS  Left Ventricle: Left ventricular ejection fraction, by estimation, is 60 to 65%. The left ventricle has normal function. The left ventricle has no regional wall motion abnormalities. The left ventricular internal cavity size was normal in size. There is  no left ventricular hypertrophy. Left ventricular diastolic parameters were normal. Right Ventricle: The right ventricular size is normal. No increase in right ventricular wall thickness. Right ventricular systolic function is normal. Left Atrium: Left atrial size was normal in size. Right Atrium: Right atrial size was normal in size. Pericardium: There is no evidence of pericardial effusion. Mitral Valve: The mitral valve is normal in structure. No evidence of mitral valve regurgitation. No evidence of mitral valve stenosis. MV peak gradient, 5.1 mmHg. Tricuspid Valve: The tricuspid valve is normal in structure. Tricuspid valve regurgitation is not demonstrated. No evidence of tricuspid stenosis. Aortic Valve: The  aortic valve is normal in structure. Aortic valve regurgitation is not visualized. No aortic stenosis is present. Pulmonic Valve: The pulmonic valve was normal in structure. Pulmonic valve regurgitation is not visualized. No evidence of pulmonic stenosis. Aorta: The aortic root is normal in size and structure. Venous: The inferior vena cava is normal in size with greater than 50% respiratory variability, suggesting right atrial pressure of 3 mmHg. IAS/Shunts: No atrial level shunt detected by color flow Doppler.  LEFT VENTRICLE PLAX 2D LVIDd:         4.70 cm     Diastology LVIDs:         3.00 cm     LV e' medial:    10.20 cm/s LV PW:         0.80 cm     LV E/e' medial:  11.7 LV IVS:        0.90 cm     LV e' lateral:   13.70 cm/s LVOT diam:     2.00 cm     LV E/e' lateral: 8.7 LV SV:         80 LV SV Index:   39 LVOT Area:     3.14 cm  LV Volumes (MOD) LV vol d, MOD A4C: 90.7 ml LV vol s, MOD A4C: 35.0 ml LV SV MOD A4C:     90.7 ml RIGHT VENTRICLE            IVC RV S prime:     9.90 cm/s  IVC diam: 2.00 cm TAPSE (M-mode): 2.5 cm LEFT ATRIUM             Index        RIGHT ATRIUM           Index LA diam:        3.20 cm 1.56 cm/m   RA Area:     10.30 cm LA Vol (A2C):   26.0 ml 12.67 ml/m  RA Volume:   19.80 ml  9.65 ml/m LA Vol (A4C):   21.8 ml 10.62 ml/m LA Biplane Vol: 25.0 ml 12.18 ml/m  AORTIC VALVE LVOT Vmax:   147.00 cm/s LVOT Vmean:  97.400 cm/s LVOT VTI:    0.256 m  AORTA Ao Root diam: 2.90 cm Ao Asc diam:  3.30 cm MITRAL VALVE MV Area (PHT): 3.15 cm     SHUNTS MV Area VTI:   3.59 cm     Systemic VTI:  0.26 m MV Peak grad:  5.1 mmHg  Systemic Diam: 2.00 cm MV Vmax:       1.13 m/s MV Vmean:      94.1 cm/s MV Decel Time: 241 msec MV E velocity: 119.00 cm/s MV A velocity: 81.70 cm/s MV E/A ratio:  1.46 Clearnce Hasten Electronically signed by Clearnce Hasten Signature Date/Time: 02/08/2023/12:33:37 PM    Final    [place] EKG SITE RITE  Result Date: 02/07/2023 If Site Rite image not attached, placement  could not be confirmed due to current cardiac rhythm.     Assessment/Plan:  INTERVAL HISTORY:  central line placed for IV access    Principal Problem:   Epidural abscess Active Problems:   Opioid use disorder   Arm wound    Lauren Allen is a 32 y.o. female with active IV drug use use of heroin prior forearm abscess that required I&D now with wound in right upper extremity and unfortunately epidural abscess which is undoubtedly due to methicillin-resistant staph coccus aureus that is growing from her blood cultures.   #1 Epidural abscess from touching the spread with MRSA being the culprit organism now growing in blood cultures  Now that her blood cultures are growing MRSA we know what organism we need to target.  This is a good thing from a diagnostic standpoint.  It is too bad that we were not able to have her clear her blood cultures prior to placement of a central line for now would continue with her central line and continue with vancomycin.  I will repeat her blood cultures hopefully we can clear her blood cultures while she has a central line and get her through a week or so of IV antibiotics before we consider a catheter holiday that she will need.  If she really is to be successfully medically managed she should stay in the hospital for the duration of her treatment though I am skeptical she will stay here for a full 6 weeks.  I think it is critical though that she gets a repeat MRI and I have told her and the mom that this should be done in the next 2 weeks to ensure the epidural abscess is improving on parenteral therapy.  #2 IVDU: I think she will need to make multiple major changes to get and stay clean. Her Mom does not btw know of the recent IVDU  I will rescreen for HIV and viral hepatides  I have personally spent 54 minutes involved in face-to-face and non-face-to-face activities for this patient on the day of the visit. Professional time spent includes the  following activities: Preparing to see the patient (review of tests), Obtaining and/or reviewing separately obtained history (admission/discharge record), Performing a medically appropriate examination and/or evaluation , Ordering medications/tests/procedures, referring and communicating with other health care professionals, Documenting clinical information in the EMR, Independently interpreting results (not separately reported), Communicating results to the patient/family/caregiver, Counseling and educating the patient/family/caregiver and Care coordination (not separately reported).    My partner Dr. Elinor Parkinson is available for questions this weekend follow-up the culture data.  Dr. Thedore Mins and the new infectious ease team will take over on Monday.    LOS: 2 days   Acey Lav 02/09/2023, 1:13 PM

## 2023-02-10 LAB — CULTURE, BLOOD (ROUTINE X 2)
Special Requests: ADEQUATE
Special Requests: ADEQUATE

## 2023-02-10 LAB — BASIC METABOLIC PANEL
Anion gap: 7 (ref 5–15)
BUN: 10 mg/dL (ref 6–20)
CO2: 27 mmol/L (ref 22–32)
Calcium: 8.6 mg/dL — ABNORMAL LOW (ref 8.9–10.3)
Chloride: 103 mmol/L (ref 98–111)
Creatinine, Ser: 0.61 mg/dL (ref 0.44–1.00)
GFR, Estimated: >60 mL/min (ref >60)
Glucose, Bld: 101 mg/dL — ABNORMAL HIGH (ref 70–99)
Potassium: 3.8 mmol/L (ref 3.5–5.1)
Sodium: 137 mmol/L (ref 135–145)

## 2023-02-10 LAB — CBC WITH DIFFERENTIAL/PLATELET
Abs Immature Granulocytes: 0.16 10*3/uL — ABNORMAL HIGH (ref 0.00–0.07)
Basophils Absolute: 0 10*3/uL (ref 0.0–0.1)
Basophils Relative: 0 %
Eosinophils Absolute: 0.2 10*3/uL (ref 0.0–0.5)
Eosinophils Relative: 1 %
HCT: 24.8 % — ABNORMAL LOW (ref 36.0–46.0)
Hemoglobin: 6.6 g/dL — CL (ref 12.0–15.0)
Immature Granulocytes: 1 %
Lymphocytes Relative: 20 %
Lymphs Abs: 2.3 10*3/uL (ref 0.7–4.0)
MCH: 16.6 pg — ABNORMAL LOW (ref 26.0–34.0)
MCHC: 26.6 g/dL — ABNORMAL LOW (ref 30.0–36.0)
MCV: 62.5 fL — ABNORMAL LOW (ref 80.0–100.0)
Monocytes Absolute: 0.6 10*3/uL (ref 0.1–1.0)
Monocytes Relative: 5 %
Neutro Abs: 8.1 10*3/uL — ABNORMAL HIGH (ref 1.7–7.7)
Neutrophils Relative %: 73 %
Platelets: 441 10*3/uL — ABNORMAL HIGH (ref 150–400)
RBC: 3.97 MIL/uL (ref 3.87–5.11)
RDW: 20.2 % — ABNORMAL HIGH (ref 11.5–15.5)
WBC: 11.4 10*3/uL — ABNORMAL HIGH (ref 4.0–10.5)
nRBC: 0 % (ref 0.0–0.2)

## 2023-02-10 LAB — PREPARE RBC (CROSSMATCH)

## 2023-02-10 LAB — HEPATITIS B SURFACE ANTIGEN: Hepatitis B Surface Ag: NONREACTIVE

## 2023-02-10 LAB — HEPATITIS B SURFACE ANTIBODY, QUANTITATIVE: Hep B S AB Quant (Post): 3086 m[IU]/mL

## 2023-02-10 MED ORDER — KETOROLAC TROMETHAMINE 15 MG/ML IJ SOLN
15.0000 mg | Freq: Three times a day (TID) | INTRAMUSCULAR | Status: AC
  Administered 2023-02-10 – 2023-02-12 (×6): 15 mg via INTRAVENOUS
  Filled 2023-02-10 (×6): qty 1

## 2023-02-10 MED ORDER — SODIUM CHLORIDE 0.9% IV SOLUTION: Freq: Once | INTRAVENOUS | Status: AC

## 2023-02-10 MED ORDER — NAPROXEN 250 MG PO TABS
500.0000 mg | ORAL_TABLET | Freq: Two times a day (BID) | ORAL | Status: DC
  Administered 2023-02-10: 500 mg via ORAL
  Filled 2023-02-10: qty 2

## 2023-02-10 NOTE — Progress Notes (Signed)
Overview: Patient is a 32 year old female with PMH of GAD, MDD, tobacco use, opioid use disorder, chronic bilateral UE wound, and Crohn's disease who presented with neck pain radiating to the back. Patient admitted for an epidural abscess from C4-C7 and blood cultures from 10/16 grew MRSA.   Overnight: NAEON.  Subjective: States slept well. Pain well controlled with Toradol.   Objective:  Vital signs in last 24 hours: afebrile, normotensive, satting well on RA.  Vitals:   02/09/23 1552 02/09/23 2003 02/10/23 0218 02/10/23 0558  BP: 108/68 (!) 126/109 (!) 122/100 124/82  Pulse: 86 91 78 82  Resp: 16 19 20 20   Temp: 98.7 F (37.1 C) 98.7 F (37.1 C) 98.6 F (37 C) 98.7 F (37.1 C)  TempSrc: Oral Oral Oral Oral  SpO2: 95% 98% 100% 91%   Supplemental O2: Room Air Last BM Date : 02/06/23 SpO2: 91 % There were no vitals filed for this visit.  Intake/Output Summary (Last 24 hours) at 02/10/2023 0751 Last data filed at 02/10/2023 0340 Gross per 24 hour  Intake 1209.17 ml  Output --  Net 1209.17 ml   Net IO Since Admission: 2,169.17 mL [02/10/23 0751]   Physical Exam General: NAD HENT: NCAT Lungs: CTAB, no wheeze, rhonchi or rales.  Cardiovascular: Normal heart sounds, No LE edema Abdomen: No TTP, normal bowel sounds MSK: No asymmetry or muscle atrophy.  Skin: bilateral forearm wounds are covered.  Neuro: Alert and oriented x4. CN grossly intact Psych: Normal mood and normal affect   Diagnostics    Latest Ref Rng & Units 02/09/2023    6:05 PM 02/08/2023    6:58 AM 02/07/2023    2:17 PM  CBC  WBC 4.0 - 10.5 K/uL 13.8  14.0  16.6   Hemoglobin 12.0 - 15.0 g/dL 7.3  7.6  7.9   Hematocrit 36.0 - 46.0 % 27.1  27.3  28.9   Platelets 150 - 400 K/uL 502  434  483        Latest Ref Rng & Units 02/08/2023    6:58 AM 02/07/2023    8:28 AM 02/06/2023    2:13 AM  BMP  Glucose 70 - 99 mg/dL 86  191  98   BUN 6 - 20 mg/dL 10  7  12    Creatinine 0.44 - 1.00 mg/dL 4.78   2.95  6.21   Sodium 135 - 145 mmol/L 137  136  137   Potassium 3.5 - 5.1 mmol/L 3.8  4.0  3.9   Chloride 98 - 111 mmol/L 97  95  99   CO2 22 - 32 mmol/L 27  27  29    Calcium 8.9 - 10.3 mg/dL 9.5  9.6  9.3     Assessment/Plan: Principal Problem:   Epidural abscess Active Problems:   Opioid use disorder   Arm wound   MRSA bacteremia  Epidural Abscess C4-C7 Secondary to MRSA Bacteremia Patient has been afebrile with complaints of neck pain along with muscle tightness. Leukocytosis on arrival of 17.8 that is continuing to improve. Lactic acid is 0.4. Cervical MRI showed epidural phlegmonous inflammation from upper C4 to upper C7. Effacement of the subarachnoid space surrounding the cord with flattening of the cord. No evidence of osteomyelitis. Possible posterior discitis at C5-6 which has decompressed into the epidural space, not definitely the origin  -Neurosurgery does not recommend any surgical intervention at this time.  -Blood and urine cultures drawn 10/16. Blood culture x3 grew MRSA. ID recommends continuing with  IV Vancomycin for at least one week before attempting line holiday -TTE ordered to r/o infective endocarditis. TTE is normal. ID does not recommend TEE due to risk of paralysis given epidural abscess. -Continue to trend CBC -Pain control with stepwise therapy: Tylenol 1000 mg  Q4 PRN for mild pain, Oxycodone 15 mg Q4 PRN for severe pain 1-2 tablets Q4 PRN, Dilaudid 2 mg Q3 PRN for breakthrough pain. Toradol added at 15 mg q6hrs which pt states helps.  -Also on methadone 55 mg tid (home dose 165 mg daily) -Lidocaine patch for neck pain -Robaxin 1000 QID for muscle tightness    Opioid Use Disorder Patient has a history of heroin use. Confirmed Methadone dosage of 165 mg from [facility]. Last IV drug use was 11/2022. -Continue Methadone 55 mg TID   Chronic Skin Wounds, Bilateral UE Hx of Anxiety Hx of Depression Has bilateral skin wounds on the forearms  that has required debridement and skin grafting in the past. Patient reports skin picking due to anxiety. -Wound care consulted and applying topical treatments and bandages. WC recommends patient continue her OP follow-up with Dr. Lajoyce Corners   Microcytic Anemia Hgb has been downtrending for the past four months. Most recent Hgb was 6.6. MCV is 61.6. Suspect this is secondary to IDA and her bacteremia. No signs of bleeding reported. Will give 1 unit of pRBC. Iron 13, TIBC 3, and Ferritin 17. -Given acute infection involving the spine. recommend iron supplementation outpatient -Will transfuse as needed for Hgb <7  Trichomoniasis Positive urine analysis but patient denies any recent sexual activity or prior infection. -Patient is already covered with Flagyl treatment. Will continue Metronidazole 500 po BID treatment.   Thrombocytosis Platelet 489 on admission and continuing to decrease. 441 today. Likely elevated in the setting of infection. Platelet smear shows normal morphology. -Will continue to monitor   Tobacco Use Disorder Patient smokes 1/4 ppd for roughly 16 years -Nicotine patch 14 mg   Intermittent Hypoxia while Sleeping Patient does not have a formal diagnosis of OSA. STOP BANG score is 3. She endorses some daytime fatigue, headache, and heavy snoring during sleep. -Recommend sleep studies outpatient -Supplemental O2 at bedtime PRN  Diet: Normal IVF: None,None VTE: Heparin Code: Full PT/OT recs: pending Prior to Admission Living Arrangement: Home Anticipated Discharge Location: Home Barriers to Discharge: Medical Stability Dispo: Anticipated discharge in approximately TBD day(s).   Lauren Abbot, MD Lauren Allen. [facility], PGY-3  Pager: [phone] After 5 pm and on weekends: Please call the on-call pager

## 2023-02-10 NOTE — Progress Notes (Signed)
Unit of prbcs infused this pm as ordered, repeat H&H drawn by iv team

## 2023-02-10 NOTE — Plan of Care (Signed)
  Problem: Clinical Measurements: Goal: Ability to maintain clinical measurements within normal limits will improve Outcome: Progressing Goal: Will remain free from infection Outcome: Progressing Goal: Diagnostic test results will improve Outcome: Progressing Goal: Respiratory complications will improve Outcome: Progressing Goal: Cardiovascular complication will be avoided Outcome: Progressing   Problem: Activity: Goal: Risk for activity intolerance will decrease Outcome: Progressing   Problem: Elimination: Goal: Will not experience complications related to bowel motility Outcome: Progressing Goal: Will not experience complications related to urinary retention Outcome: Progressing   Problem: Pain Managment: Goal: General experience of comfort will improve Outcome: Progressing   Problem: Safety: Goal: Ability to remain free from injury will improve Outcome: Progressing   

## 2023-02-11 LAB — TYPE AND SCREEN
ABO/RH(D): A POS
Antibody Screen: NEGATIVE
Unit division: 0

## 2023-02-11 LAB — CBC WITH DIFFERENTIAL/PLATELET
Abs Immature Granulocytes: 0.3 10*3/uL — ABNORMAL HIGH (ref 0.00–0.07)
Basophils Absolute: 0 10*3/uL (ref 0.0–0.1)
Basophils Relative: 0 %
Eosinophils Absolute: 0.2 10*3/uL (ref 0.0–0.5)
Eosinophils Relative: 2 %
HCT: 26.8 % — ABNORMAL LOW (ref 36.0–46.0)
Hemoglobin: 7.4 g/dL — ABNORMAL LOW (ref 12.0–15.0)
Immature Granulocytes: 3 %
Lymphocytes Relative: 17 %
Lymphs Abs: 2 10*3/uL (ref 0.7–4.0)
MCH: 17.5 pg — ABNORMAL LOW (ref 26.0–34.0)
MCHC: 27.6 g/dL — ABNORMAL LOW (ref 30.0–36.0)
MCV: 63.5 fL — ABNORMAL LOW (ref 80.0–100.0)
Monocytes Absolute: 0.8 10*3/uL (ref 0.1–1.0)
Monocytes Relative: 6 %
Neutro Abs: 8.5 10*3/uL — ABNORMAL HIGH (ref 1.7–7.7)
Neutrophils Relative %: 72 %
Platelets: 423 10*3/uL — ABNORMAL HIGH (ref 150–400)
RBC: 4.22 MIL/uL (ref 3.87–5.11)
RDW: 22.1 % — ABNORMAL HIGH (ref 11.5–15.5)
WBC: 11.7 10*3/uL — ABNORMAL HIGH (ref 4.0–10.5)
nRBC: 0 % (ref 0.0–0.2)

## 2023-02-11 LAB — BASIC METABOLIC PANEL
Anion gap: 7 (ref 5–15)
BUN: 8 mg/dL (ref 6–20)
CO2: 28 mmol/L (ref 22–32)
Calcium: 8.7 mg/dL — ABNORMAL LOW (ref 8.9–10.3)
Chloride: 102 mmol/L (ref 98–111)
Creatinine, Ser: 0.51 mg/dL (ref 0.44–1.00)
GFR, Estimated: >60 mL/min (ref >60)
Glucose, Bld: 115 mg/dL — ABNORMAL HIGH (ref 70–99)
Potassium: 3.6 mmol/L (ref 3.5–5.1)
Sodium: 137 mmol/L (ref 135–145)

## 2023-02-11 LAB — BPAM RBC
Blood Product Expiration Date: 202410262359
ISSUE DATE / TIME: 202410191351
Unit Type and Rh: 6200

## 2023-02-11 LAB — VANCOMYCIN, PEAK: Vancomycin Pk: 33 ug/mL (ref 30–40)

## 2023-02-11 LAB — MAGNESIUM: Magnesium: 1.9 mg/dL (ref 1.7–2.4)

## 2023-02-11 LAB — VITAMIN B12: Vitamin B-12: 579 pg/mL (ref 180–914)

## 2023-02-11 LAB — VANCOMYCIN, TROUGH: Vancomycin Tr: 8 ug/mL — ABNORMAL LOW (ref 15–20)

## 2023-02-11 MED ORDER — VANCOMYCIN HCL 1250 MG/250ML IV SOLN
1250.0000 mg | Freq: Three times a day (TID) | INTRAVENOUS | Status: DC
  Administered 2023-02-11 – 2023-02-13 (×6): 1250 mg via INTRAVENOUS
  Filled 2023-02-11 (×7): qty 250

## 2023-02-11 NOTE — Plan of Care (Signed)
  Problem: Pain Management: Goal: Pain level will decrease with appropriate interventions Outcome: Progressing   Problem: Activity: Goal: Risk for activity intolerance will decrease Outcome: Progressing   Problem: Nutrition: Goal: Adequate nutrition will be maintained Outcome: Progressing   Problem: Safety: Goal: Ability to remain free from injury will improve Outcome: Progressing

## 2023-02-11 NOTE — Progress Notes (Addendum)
Subjective:  Patient was seen and examined at bedside this morning. She reports some improvement in her pain this morning after adding Toradol to pain regiment and had improvement in sleep overnight. Patient still has some complaints of neck pain and some lateral left hand numbness and intermittent shooting pain down the lower extremities. Denies any motor weakness in the upper and lower extremities. Denies fever and chills.  Objective:  Vital signs in last 24 hours: Vitals:   02/10/23 1419 02/10/23 1625 02/10/23 1715 02/10/23 2113  BP: 112/80 (!) 145/97 136/87 95/78  Pulse: 77 86 76 86  Resp: 18 17 16    Temp: 99 F (37.2 C) 98.3 F (36.8 C) 98.7 F (37.1 C) 98.2 F (36.8 C)  TempSrc: Oral Oral Oral Oral  SpO2: 99% 100% 99% 100%   Supplemental O2: Room Air  SpO2: 100  Weight change:   Intake/Output Summary (Last 24 hours) at 02/11/2023 0647 Last data filed at 02/11/2023 5284 Gross per 24 hour  Intake 1599.35 ml  Output --  Net 1599.35 ml   Physical Exam Constitutional:      General: She is not in acute distress.    Appearance: She is obese.  HENT:     Head: Normocephalic and atraumatic.  Neck:     Comments: Mild tenderness in the upper cervical region. Tenderness is improved from admission. Cardiovascular:     Rate and Rhythm: Normal rate and regular rhythm.  Pulmonary:     Effort: Pulmonary effort is normal.     Breath sounds: Normal breath sounds.  Abdominal:     General: Abdomen is flat. Bowel sounds are normal.  Musculoskeletal:     Right hand: No tenderness. Normal range of motion. Normal strength.     Left hand: No tenderness. Normal range of motion. Normal strength. Decreased sensation of the ulnar distribution.     Cervical back: Tenderness present.     Right lower leg: No edema.     Left lower leg: No edema.  Skin:    General: Skin is warm.     Comments: Bilateral forearms wrapped in Kerlex. No oozing noted form bandages  Neurological:     Mental  Status: She is alert and oriented to person, place, and time.      Assessment/Plan:  Principal Problem:   Epidural abscess Active Problems:   Opioid use disorder   Arm wound   MRSA bacteremia   Epidural Abscess C4-C7 Secondary to MRSA Bacteremia Patient has been afebrile with complaints of neck pain along with muscle tightness. Leukocytosis on arrival of 17.8 that is continuing to improve. Lactic acid is 0.4. Cervical MRI showed epidural phlegmonous inflammation from upper C4 to upper C7. Effacement of the subarachnoid space surrounding the cord with flattening of the cord. No evidence of osteomyelitis. Possible posterior discitis at C5-6 which has decompressed into the epidural space, not definitely the origin  -Neurosurgery does not recommend any surgical intervention at this time.  -Blood and urine cultures drawn 10/16. Blood culture x3 grew MRSA. ID recommends continuing with IV Vancomycin for at least one week before attempting line holiday -TTE ordered to r/o infective endocarditis. TTE is normal. ID does not recommend TEE due to risk of paralysis given epidural abscess. -Continue to trend CBC -Pain control with stepwise therapy: Tylenol 1000 mg Q4 PRN for mild pain, Oxycodone 15 mg Q4 PRN for severe pain 1-2 tablets Q4 PRN, Dilaudid 2 mg Q3 PRN for breakthrough pain. Toradol added at 15 mg Q8 PRN which  pt states helps.  -Also on methadone 55 mg tid (home dose 165 mg daily) -Lidocaine patch for neck pain -Robaxin 1000 QID for muscle tightness     Opioid Use Disorder Patient has a history of heroin use. Confirmed Methadone dosage of 165 mg from [facility]. Last IV drug use was 11/2022. -Continue Methadone 55 mg TID   Chronic Skin Wounds, Bilateral UE Hx of Anxiety Hx of Depression Has bilateral skin wounds on the forearms that has required debridement and skin grafting in the past. Patient reports skin picking due to anxiety. -Wound care consulted and applying  topical treatments and bandages. WC recommends patient continue her OP follow-up with Dr. Lajoyce Corners   Microcytic Anemia Hgb has been downtrending for the past four months. Most recent Hgb was 7.4. MCV is 61.6. Suspect this is secondary to IDA and her bacteremia. No signs of bleeding reported. Will give 1 unit of pRBC. Iron 13, TIBC 3, and Ferritin 17. -Patient given 1 unit of PRBC after hemoglobin of 6.6 on 10/19. Hgb the next day improved to 7.4. -Given acute infection involving the spine, recommend iron supplementation outpatient -Will transfuse as needed for Hgb <7   Trichomoniasis Positive urine analysis but patient denies any recent sexual activity or prior infection. -Patient is already covered with Flagyl treatment. Will continue Metronidazole 500 po BID treatment for 7 days.   Thrombocytosis Platelet 489 on admission and continuing to decrease. 423 today. Likely elevated in the setting of infection. Platelet smear shows normal morphology. -Will continue to monitor   Left Hand Numbness Bilateral Lower Extremity Muscle Cramps Patient reports intermitted lower extremity cramps and some decreased sensation of the left lateral hand. Strength is 5/5 in the upper and lower extremities. Patient B12 and Mg WNL -Will continue to monitor symptoms. No interventions indicated at this time.  Tobacco Use Disorder Patient smokes 1/4 ppd for roughly 16 years -Nicotine patch 14 mg   Intermittent Hypoxia while Sleeping Patient does not have a formal diagnosis of OSA. STOP BANG score is 3. She endorses some daytime fatigue, headache, and heavy snoring during sleep. -Recommend sleep studies outpatient -Supplemental O2 at bedtime PRN   Diet: Normal IVF: None,None VTE: Heparin Code: Full PT/OT recs: pending Prior to Admission Living Arrangement: Home Anticipated Discharge Location: Home Barriers to Discharge: Medical Stability   LOS: 4 days   Scherrie November, Medical Student 02/11/2023, 6:47  AM   Attestation for Student Documentation:  I personally was present and performed or re-performed the history, physical exam and medical decision-making activities of this service and have verified that the service and findings are accurately documented in the student's note.  Gwenevere Abbot, MD 02/11/2023, 12:55 PM

## 2023-02-11 NOTE — Progress Notes (Signed)
Pharmacy Antibiotic Note  Lauren Allen is a 32 y.o. female admitted on 02/07/2023 with  epidural abscess . Pharmacy has been consulted for vancomycin dosing, targeting a goal trough of 15-20 mcg/mL.  Patient has a cervical epidural abscess and bacteremia with cultures positive for MRSA that is currently being treated with vancomycin 1250 mg IV q12h. Vancomycin trough of 8 obtained 10/20 at 0454 and vancomycin peak of 33 obtained 10/20 at 0920. Renal function stable with Scr of 0.51.   Plan: Increase vancomycin to 1250 mg IV q8hr (calculated Cmin 14.5) Continue to monitor renal function Obtain levels as needed     Temp (24hrs), Avg:98.5 F (36.9 C), Min:98.2 F (36.8 C), Max:99 F (37.2 C)  Recent Labs  Lab 02/06/23 0213 02/07/23 0828 02/07/23 0834 02/07/23 1041 02/07/23 1417 02/08/23 0658 02/09/23 1805 02/10/23 0929 02/11/23 0454 02/11/23 0459 02/11/23 0920  WBC 13.6* 17.8*  --   --  16.6* 14.0* 13.8* 11.4*  --  11.7*  --   CREATININE 0.67 0.58  --   --   --  0.61  --  0.61  --  0.51  --   LATICACIDVEN  --   --  0.9 0.4*  --   --   --   --   --   --   --   VANCOTROUGH  --   --   --   --   --   --   --   --  8*  --   --   VANCOPEAK  --   --   --   --   --   --   --   --   --   --  33    Estimated Creatinine Clearance: 116.7 mL/min (by C-G formula based on SCr of 0.51 mg/dL).    Allergies  Allergen Reactions   Sulfa Antibiotics Palpitations    Antimicrobials this admission: Ceftriaxone 10/16 >> 10/18 Metronidazole 10/16 >>  Ciprofloxacin 10/16 >> 10/17  Linezolid 10/16 >> 10/17   Microbiology results: 10/16 BCx: MRSA 10/18 Bcx: NGTD  Thank you for allowing pharmacy to be a part of this patient's care.  Lennie Muckle, PharmD PGY1 Pharmacy Resident 02/11/2023 1:10 PM

## 2023-02-12 ENCOUNTER — Inpatient Hospital Stay (HOSPITAL_COMMUNITY): Payer: Medicaid Other

## 2023-02-12 DIAGNOSIS — G062 Extradural and subdural abscess, unspecified: Secondary | ICD-10-CM

## 2023-02-12 LAB — CBC WITH DIFFERENTIAL/PLATELET
Abs Immature Granulocytes: 0.49 10*3/uL — ABNORMAL HIGH (ref 0.00–0.07)
Basophils Absolute: 0.1 10*3/uL (ref 0.0–0.1)
Basophils Relative: 1 %
Eosinophils Absolute: 0.2 10*3/uL (ref 0.0–0.5)
Eosinophils Relative: 1 %
HCT: 27.2 % — ABNORMAL LOW (ref 36.0–46.0)
Hemoglobin: 7.5 g/dL — ABNORMAL LOW (ref 12.0–15.0)
Immature Granulocytes: 4 %
Lymphocytes Relative: 18 %
Lymphs Abs: 2.2 10*3/uL (ref 0.7–4.0)
MCH: 17.8 pg — ABNORMAL LOW (ref 26.0–34.0)
MCHC: 27.6 g/dL — ABNORMAL LOW (ref 30.0–36.0)
MCV: 64.5 fL — ABNORMAL LOW (ref 80.0–100.0)
Monocytes Absolute: 0.8 10*3/uL (ref 0.1–1.0)
Monocytes Relative: 7 %
Neutro Abs: 8.7 10*3/uL — ABNORMAL HIGH (ref 1.7–7.7)
Neutrophils Relative %: 69 %
Platelets: 412 10*3/uL — ABNORMAL HIGH (ref 150–400)
RBC: 4.22 MIL/uL (ref 3.87–5.11)
RDW: 22.7 % — ABNORMAL HIGH (ref 11.5–15.5)
WBC: 12.4 10*3/uL — ABNORMAL HIGH (ref 4.0–10.5)
nRBC: 0 % (ref 0.0–0.2)

## 2023-02-12 LAB — VANCOMYCIN, RANDOM: Vancomycin Rm: 16 ug/mL

## 2023-02-12 MED ORDER — GADOBUTROL 1 MMOL/ML IV SOLN
10.0000 mL | Freq: Once | INTRAVENOUS | Status: AC | PRN
  Administered 2023-02-12: 10 mL via INTRAVENOUS

## 2023-02-12 MED ORDER — LORAZEPAM 2 MG/ML PO CONC
1.0000 mg | Freq: Once | ORAL | Status: AC | PRN
  Administered 2023-02-12: 1 mg via ORAL
  Filled 2023-02-12: qty 1

## 2023-02-12 MED ORDER — KETOROLAC TROMETHAMINE 15 MG/ML IJ SOLN
15.0000 mg | Freq: Three times a day (TID) | INTRAMUSCULAR | Status: DC
  Administered 2023-02-12 – 2023-02-13 (×3): 15 mg via INTRAVENOUS
  Filled 2023-02-12 (×3): qty 1

## 2023-02-12 MED ORDER — KETOROLAC TROMETHAMINE 15 MG/ML IJ SOLN
INTRAMUSCULAR | Status: AC
  Filled 2023-02-12: qty 1

## 2023-02-12 MED ORDER — LORAZEPAM 2 MG/ML IJ SOLN
1.0000 mg | Freq: Once | INTRAMUSCULAR | Status: AC | PRN
  Administered 2023-02-12: 1 mg via INTRAVENOUS
  Filled 2023-02-12: qty 1

## 2023-02-12 MED ORDER — LORAZEPAM 2 MG/ML PO CONC: 1.0000 mg | Freq: Once | ORAL | Status: DC | PRN

## 2023-02-12 NOTE — Progress Notes (Signed)
Patient ID: Lauren Allen, female   DOB: 09-20-1990, 32 y.o.   MRN: [id number] Spoke with patient. Right forearm wound would benefit from surgical consultation with Dr. Lajoyce Corners who treated her left forearm.  Left triceps strength is much improved from this AM, 4+/5. Will check again in the morning.

## 2023-02-12 NOTE — Progress Notes (Addendum)
Patient ID: Lauren Allen, female   DOB: November 16, 1990, 32 y.o.   MRN: [id number] BP 97/65 (BP Location: Right Arm)   Pulse 80   Temp 98.6 F (37 C) (Oral)   Resp 16   LMP 01/09/2023 (Exact Date)   SpO2 100%  Alert and oriented x4. Speech is clear and fluent.  Weakness 4/5 left triceps. Deltoids, biceps are normal. Intrinsics 4/5, grip 5-/5 Normal reflexes upper extremities Pinprick is normal upper extremities Normal proprioception upper extremities Hyperreflexic lower extremities. Scan ordered.

## 2023-02-12 NOTE — Progress Notes (Addendum)
[facility]  Date of Admission:  02/07/2023   Total days of inpatient antibiotics 4  Principal Problem:   Epidural abscess Active Problems:   Opioid use disorder   Arm wound   MRSA bacteremia          Assessment: 32 year old female admitted with: #MRSA bacteremia with epidural abscess #History of prior arm abscesses - Patient found to have epidural phlegmonous inflammation C4-C7.  Found to have MRSA bacteremia in the setting of IVDA. - Neurosurgery was engaged, no plans for operative management as no neurological deficit at that time. - Since yesterday patient has had some left forearm weakness.  MRI cervical spine reordered. - Of note she had a central line placed on 10/17 prior to sending blood cultures clearance on 10/18 -Patient injects into her arms, suspect that is the source of her bacteremia complicated by epidural abscess. #History of IVDA - Last snorted heroin week prior to admission, relapsed from IV drug use this summer in June. - Would avoid PICC line in this patient.  Not a home IV antibiotic candidate Recommendations: -Remove central line(placed 10/17), will need a line holiday x 72 hours - Follow-up blood cultures on 10/18 to ensure compliance - Continue vancomycin - Await MRI cervical results.  TTE did not show vegetation, may not be a candidate for TEE   Microbiology:   Antibiotics: Metronidazole 10/16-present Linezolid 10/16-17 Ciprofloxacin 10/16-17 Vancomycin 10/17-present Cultures: Blood 10/16 2/2 MRSA 10/18 no growth x 3 days Urine  Other   SUBJECTIVE: Reports she is feeling better.  Dressed and able to ambulate. Interval: Afebrile overnight.  WBC 12.4 K.  Review of Systems: Review of Systems  All other systems reviewed and are negative.    Scheduled Meds:  acetaminophen  1,000 mg Oral Q6H   Chlorhexidine Gluconate Cloth  6 each Topical Q0600   docusate sodium  100 mg Oral Daily   heparin   5,000 Units Subcutaneous Q8H   lidocaine  1 patch Transdermal Q24H   melatonin  5 mg Oral QHS   methadone  55 mg Oral TID   methocarbamol  1,000 mg Oral QID   metroNIDAZOLE  500 mg Oral Q12H   mupirocin ointment  1 Application Nasal BID   nicotine  14 mg Transdermal Daily   oxyCODONE  15 mg Oral Q4H   polyethylene glycol  17 g Oral Daily   traZODone  50 mg Oral QHS   Continuous Infusions:  vancomycin 1,250 mg (02/12/23 0527)   PRN Meds:.HYDROmorphone, LORazepam, mirtazapine, naLOXone (NARCAN)  injection Allergies  Allergen Reactions   Sulfa Antibiotics Palpitations    OBJECTIVE: Vitals:   02/11/23 1640 02/11/23 2002 02/12/23 0541 02/12/23 0752  BP: 128/89 136/85 121/77 97/65  Pulse: 88 (!) 103 79 80  Resp: 18   16  Temp: 98.5 F (36.9 C) 98 F (36.7 C) 98.6 F (37 C) 98.6 F (37 C)  TempSrc: Oral   Oral  SpO2: 100% 98% 100% 100%   There is no height or weight on file to calculate BMI.  Physical Exam Constitutional:      Appearance: Normal appearance.  HENT:     Head: Normocephalic and atraumatic.     Right Ear: Tympanic membrane normal.     Left Ear: Tympanic membrane normal.     Nose: Nose normal.     Mouth/Throat:     Mouth: Mucous membranes are moist.  Eyes:     Extraocular Movements: Extraocular movements  intact.     Conjunctiva/sclera: Conjunctivae normal.     Pupils: Pupils are equal, round, and reactive to light.  Cardiovascular:     Rate and Rhythm: Normal rate and regular rhythm.     Heart sounds: No murmur heard.    No friction rub. No gallop.  Pulmonary:     Effort: Pulmonary effort is normal.     Breath sounds: Normal breath sounds.  Abdominal:     General: Abdomen is flat.     Palpations: Abdomen is soft.  Musculoskeletal:        General: Normal range of motion.  Skin:    General: Skin is warm and dry.  Neurological:     General: No focal deficit present.     Mental Status: She is alert and oriented to person, place, and time.   Psychiatric:        Mood and Affect: Mood normal.       Lab Results Lab Results  Component Value Date   WBC 12.4 (H) 02/12/2023   HGB 7.5 (L) 02/12/2023   HCT 27.2 (L) 02/12/2023   MCV 64.5 (L) 02/12/2023   PLT 412 (H) 02/12/2023    Lab Results  Component Value Date   CREATININE 0.51 02/11/2023   BUN 8 02/11/2023   NA 137 02/11/2023   K 3.6 02/11/2023   CL 102 02/11/2023   CO2 28 02/11/2023    Lab Results  Component Value Date   ALT 21 02/08/2023   AST 13 (L) 02/08/2023   ALKPHOS 177 (H) 02/08/2023   BILITOT 0.6 02/08/2023        Danelle Earthly, MD [facility] 02/12/2023, 12:55 PM I have personally spent 52 minutes involved in face-to-face and non-face-to-face activities for this patient on the day of the visit. Professional time spent includes the following activities: Preparing to see the patient (review of tests), Obtaining and/or reviewing separately obtained history (admission/discharge record), Performing a medically appropriate examination and/or evaluation , Ordering medications/tests/procedures, referring and communicating with other health care professionals, Documenting clinical information in the EMR, Independently interpreting results (not separately reported), Communicating results to the patient/family/caregiver, Counseling and educating the patient/family/caregiver and Care coordination (not separately reported).

## 2023-02-12 NOTE — Consult Note (Signed)
WOC Nurse Consult Note: WOC team reconsulted for wounds to bilateral arms; patient states these are from "picking" and she has had prior surgery by Dr Lajoyce Corners on the left arm; see our consult note 02/07/2023  Reason for Consult: bilateral arm wounds  Wound type: full thickness, traumatic from picking per patient  Pressure Injury POA: NA  Measurement: 1. L arm 10 cm x 4 cm x 0.2 cm 100% pink moist  2.  R arm 14 cm x 6 cm x 0.5 cm 75% red moist 25% black tan necrotic tissue mostly at superior aspect of wound  Wound bed: as above  Drainage (amount, consistency, odor) serosanguinous L arm; minimal tan exudate R arm  Periwound:intact  Dressing procedure/placement/frequency:  Clean R arm wound with Vashe wound cleanser Hart [place] [phone]), apply Vashe moistened gauze to entire wound bed daily, cover with dry gauze and Kerlix. Secure with Ace bandage.  Clean L arm wound with NS, apply silver hydrofiber Hart [place] [phone]) to wound bed every other day.  Cover with Telfa non stick pad, kerlix and Ace bandage to secure.  POC discussed with patient and bedside nurse. Patient is in agreement with this plan.  WOC team will not follow.  Re-consult if further needs arise.   Thank you,    Missi Mcmackin MSN, RN-BC, CWOCN [phone]  Conservative sharp wound debridement (CSWD performed at the bedside):

## 2023-02-12 NOTE — Social Work (Signed)
CSW provided SU resources to bedside. Pt currently off the unit. Pt to be admitted for 6-8 weeks for IV ABX. TOC will continue to follow for DC needs.

## 2023-02-12 NOTE — Progress Notes (Signed)
Patient ID: Lauren Allen, female   DOB: Sep 01, 1990, 32 y.o.   MRN: [id number] Mri reviewed. No significant change, but weakness currently.  Possible C5/6 acd, will reassess in the AM.

## 2023-02-12 NOTE — Plan of Care (Signed)
  Problem: Education: Goal: Knowledge of the prescribed therapeutic regimen will improve Outcome: Progressing   Problem: Activity: Goal: Ability to perform//tolerate increased activity and mobilize with assistive devices will improve Outcome: Progressing   Problem: Self-Care: Goal: Ability to meet self-care needs will improve Outcome: Progressing   Problem: Pain Management: Goal: Pain level will decrease with appropriate interventions Outcome: Progressing   Problem: Education: Goal: Knowledge of General Education information will improve Description: Including pain rating scale, medication(s)/side effects and non-pharmacologic comfort measures Outcome: Progressing

## 2023-02-12 NOTE — Progress Notes (Addendum)
I was paged by Pt's nurse concerning  new weakness in the left arm. When I went to examine patient in person, she was alert and oriented to person, place and time.  On my neuro exam, she had a 4/5 strength in the left arm, with normal sensation.  5/5  strength in right arm and bilateral lower extremities. Cranial nerves II-XII normal.   Plan -Monitor for now, let her nurse know if sxs worsen. -Have the day team reach out to neurosurgery.

## 2023-02-12 NOTE — Progress Notes (Signed)
   02/12/23 0030  Provider Notification  Provider Name/Title Dr. Laretta Bolster  Date Provider Notified 02/12/23  Time Provider Notified 0045  Method of Notification Page;Call  Notification Reason Change in status (patient complain of preogressive weakness on the left upper arm)  Provider response No new orders  Date of Provider Response 02/12/23  Time of Provider Response 0100

## 2023-02-12 NOTE — Progress Notes (Addendum)
Pharmacy Antibiotic Note  Lauren Allen is a 32 y.o. female admitted on 02/07/2023 with  epidural abscess . Pharmacy has been consulted for vancomycin dosing, targeting a goal trough of 15-20 mcg/mL.  10/20 Assessment: Patient has a cervical epidural abscess and bacteremia with cultures positive for MRSA that is currently being treated with vancomycin 1250 mg IV q12h. Vancomycin trough of 8 obtained 10/20 at 0454 and vancomycin peak of 33 obtained 10/20 at 0920. Renal function stable with Scr of 0.51.   10/21 Assessment: Patient on 1250 mg IV q8h. VT at steady state 10/21 @ 1356 = 16. Last dose 10/21 at 0527. This is ~8.5 hour level. This is increased from predicted ~14.5 yesterday. Will monitor patient for signs of accumulation. Consider trough again later this week.  Plan: Continue vancomycin to 1250 mg IV q8hr Continue to monitor renal function Obtain levels as needed    Temp (24hrs), Avg:98.4 F (36.9 C), Min:98 F (36.7 C), Max:98.6 F (37 C)  Recent Labs  Lab 02/06/23 0213 02/07/23 0828 02/07/23 0834 02/07/23 1041 02/07/23 1417 02/08/23 0658 02/09/23 1805 02/10/23 0929 02/11/23 0454 02/11/23 0459 02/11/23 0920 02/12/23 0416 02/12/23 1356  WBC 13.6* 17.8*  --   --    < > 14.0* 13.8* 11.4*  --  11.7*  --  12.4*  --   CREATININE 0.67 0.58  --   --   --  0.61  --  0.61  --  0.51  --   --   --   LATICACIDVEN  --   --  0.9 0.4*  --   --   --   --   --   --   --   --   --   VANCOTROUGH  --   --   --   --   --   --   --   --  8*  --   --   --   --   VANCOPEAK  --   --   --   --   --   --   --   --   --   --  33  --   --   VANCORANDOM  --   --   --   --   --   --   --   --   --   --   --   --  16   < > = values in this interval not displayed.    Estimated Creatinine Clearance: 116.7 mL/min (by C-G formula based on SCr of 0.51 mg/dL).    Allergies  Allergen Reactions   Sulfa Antibiotics Palpitations    Antimicrobials this admission: Ceftriaxone 10/16 >>  10/18 Ciprofloxacin 10/16 >> 10/17  Linezolid 10/16 >> 10/17   Metronidazole 10/16 >>  Vancomycin 10/17 >>  Microbiology results: 10/16 BCx: MRSA 10/18 Bcx: NGTD  Thank you for allowing pharmacy to be a part of this patient's care.  Lora Paula, PharmD PGY-2 Infectious Diseases Pharmacy Resident 02/12/2023 3:26 PM

## 2023-02-12 NOTE — Progress Notes (Signed)
Subjective:  Patient was seen and examined at bedside. She notified the nighttime about left hand weakness. The night team found her to have 4/5 strength in the left hand. This morning patient still has concerns for the left handed-weakness. We conducted a neuro exam that also displayed weakness mainly in the upper arm. She reports her neck pain to have improved since admission.  Objective:  Vital signs in last 24 hours: Vitals:   02/11/23 0815 02/11/23 1640 02/11/23 2002 02/12/23 0541  BP: (!) 132/99 128/89 136/85 121/77  Pulse: 83 88 (!) 103 79  Resp: 18 18    Temp: 98.3 F (36.8 C) 98.5 F (36.9 C) 98 F (36.7 C) 98.6 F (37 C)  TempSrc: Oral Oral    SpO2: 95% 100% 98% 100%   Weight change:   Intake/Output Summary (Last 24 hours) at 02/12/2023 0653 Last data filed at 02/11/2023 1640 Gross per 24 hour  Intake 462.43 ml  Output --  Net 462.43 ml   Supplemental Oxygen: Room Air SpO2: 100  Physical Exam Constitutional:      Appearance: She is obese.  HENT:     Head: Normocephalic and atraumatic.  Neck:     Comments: Cervical tenderness has improved greatly. Cardiovascular:     Rate and Rhythm: Normal rate and regular rhythm.     Pulses: Normal pulses.     Heart sounds: Normal heart sounds.  Pulmonary:     Effort: Pulmonary effort is normal.     Breath sounds: Normal breath sounds.  Abdominal:     General: Abdomen is flat. Bowel sounds are normal.  Musculoskeletal:     Right shoulder: Normal.     Left shoulder: Decreased strength.     Right hand: Normal.     Left hand: Decreased strength of finger abduction and wrist extension. Decreased sensation of the ulnar distribution.     Comments: Decreased flexion on the left side.  Skin:    General: Skin is warm.     Comments: Bilateral forearm wounds wrapped. No oozing or discharge noted.  Neurological:     Mental Status: She is alert and oriented to person, place, and time.      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 Leukocytosis on arrival of 17.8 that is continuing to improve. Lactic acid is 0.4. Cervical MRI showed epidural phlegmonous inflammation from upper C4 to upper C7. Effacement of the subarachnoid space surrounding the cord with flattening of the cord. No evidence of osteomyelitis. Possible posterior discitis at C5-6 which has decompressed into the epidural space, not definitely the origin  -Neurosurgery does not recommend any surgical intervention at this time.  -Blood and urine cultures drawn 10/16. Blood culture x3 grew MRSA. ID recommends continuing with IV Vancomycin for at least one week before attempting line holiday. Today is Day 5 of Vanc -TTE ordered to r/o infective endocarditis. TTE is normal. ID does not recommend TEE due to risk of paralysis given epidural abscess. -Continue to trend CBC -Pain control with stepwise therapy: Tylenol 1000 mg  Q4 PRN for mild pain, Oxycodone 15 mg Q4 PRN for severe pain 1-2 tablets Q4 PRN, Dilaudid 2 mg Q3 PRN for breakthrough pain. Toradol added at 15 mg q6hrs which pt states helps.  -Also on methadone 55  mg tid (home dose 165 mg daily) -Lidocaine patch for neck pain -Robaxin 1000 QID for muscle tightness -Neurosurgery was contacted again for the left arm weakness. They are ordering repeat cervical MRI. Appreciate further recommendations pending results.   Left Hand Numbness Bilateral Lower Extremity Muscle Cramps Patient reports intermitted lower extremity cramps and some decreased sensation of the left lateral hand. Strength is 4/5 in the left arm. Patient B12 and Mg WNL -Will continue to monitor symptoms.  -Neurosurgery recommended repeat cervical MRI. Will appreciate recommendations pending results.   Chronic  Skin Wounds, Bilateral UE Hx of Anxiety Hx of Depression Has bilateral skin wounds on the forearms that has required debridement and skin grafting in the past. Patient reports skin picking due to anxiety. -Wound care consulted and applying topical treatments and bandages. WC recommends patient continue her OP follow-up with Dr. Lajoyce Corners   Microcytic Anemia Hgb has been downtrending for the past four months. Most recent Hgb was 6.6. MCV is 61.6. Suspect this is secondary to IDA and her bacteremia. No signs of bleeding reported. Will give 1 unit of pRBC. Iron 13, TIBC 3, and Ferritin 17. -Given acute infection involving the spine. recommend iron supplementation outpatient -Will transfuse as needed for Hgb <7   Trichomoniasis Positive urine analysis but patient denies any recent sexual activity or prior infection. -Patient is already covered with Flagyl treatment. Will continue Metronidazole 500 po BID treatment.   Thrombocytosis Platelet 489 on admission and continuing to decrease. 441 today. Likely elevated in the setting of infection. Platelet smear shows normal morphology. -Will continue to monitor   Tobacco Use Disorder Patient smokes 1/4 ppd for roughly 16 years -Nicotine patch 14 mg   Intermittent Hypoxia while Sleeping Patient does not have a formal diagnosis of OSA. STOP BANG score is 3. She endorses some daytime fatigue, headache, and heavy snoring during sleep. -Recommend sleep studies outpatient -Supplemental O2 at bedtime PRN   Diet: Normal IVF: None,None VTE: Heparin Code: Full PT/OT recs: pending Prior to Admission Living Arrangement: Home Anticipated Discharge Location: Home Barriers to Discharge: Medical Stability Dispo: Anticipated discharge in approximately TBD day(s).      LOS: 5 days   Scherrie November, Medical Student 02/12/2023, 6:53 AM

## 2023-02-12 NOTE — Plan of Care (Signed)

## 2023-02-13 ENCOUNTER — Inpatient Hospital Stay (HOSPITAL_COMMUNITY): Payer: Medicaid Other

## 2023-02-13 ENCOUNTER — Other Ambulatory Visit (HOSPITAL_COMMUNITY): Payer: Self-pay

## 2023-02-13 DIAGNOSIS — F1110 Opioid abuse, uncomplicated: Secondary | ICD-10-CM

## 2023-02-13 HISTORY — PX: IR REMOVAL TUN CV CATH W/O FL: IMG2289

## 2023-02-13 LAB — COMPREHENSIVE METABOLIC PANEL
ALT: 25 U/L (ref 0–44)
AST: 18 U/L (ref 15–41)
Albumin: 2.5 g/dL — ABNORMAL LOW (ref 3.5–5.0)
Alkaline Phosphatase: 136 U/L — ABNORMAL HIGH (ref 38–126)
Anion gap: 12 (ref 5–15)
BUN: 9 mg/dL (ref 6–20)
CO2: 26 mmol/L (ref 22–32)
Calcium: 9.3 mg/dL (ref 8.9–10.3)
Chloride: 100 mmol/L (ref 98–111)
Creatinine, Ser: 0.66 mg/dL (ref 0.44–1.00)
GFR, Estimated: >60 mL/min (ref >60)
Glucose, Bld: 99 mg/dL (ref 70–99)
Potassium: 3.8 mmol/L (ref 3.5–5.1)
Sodium: 138 mmol/L (ref 135–145)
Total Bilirubin: 0.4 mg/dL (ref 0.3–1.2)
Total Protein: 6.6 g/dL (ref 6.5–8.1)

## 2023-02-13 LAB — CBC WITH DIFFERENTIAL/PLATELET
Abs Immature Granulocytes: 0.49 10*3/uL — ABNORMAL HIGH (ref 0.00–0.07)
Basophils Absolute: 0.1 10*3/uL (ref 0.0–0.1)
Basophils Relative: 1 %
Eosinophils Absolute: 0.2 10*3/uL (ref 0.0–0.5)
Eosinophils Relative: 2 %
HCT: 27.3 % — ABNORMAL LOW (ref 36.0–46.0)
Hemoglobin: 7.5 g/dL — ABNORMAL LOW (ref 12.0–15.0)
Immature Granulocytes: 4 %
Lymphocytes Relative: 23 %
Lymphs Abs: 2.5 10*3/uL (ref 0.7–4.0)
MCH: 17.7 pg — ABNORMAL LOW (ref 26.0–34.0)
MCHC: 27.5 g/dL — ABNORMAL LOW (ref 30.0–36.0)
MCV: 64.4 fL — ABNORMAL LOW (ref 80.0–100.0)
Monocytes Absolute: 0.7 10*3/uL (ref 0.1–1.0)
Monocytes Relative: 6 %
Neutro Abs: 7.2 10*3/uL (ref 1.7–7.7)
Neutrophils Relative %: 64 %
Platelets: 394 10*3/uL (ref 150–400)
RBC: 4.24 MIL/uL (ref 3.87–5.11)
RDW: 23.5 % — ABNORMAL HIGH (ref 11.5–15.5)
WBC: 11.1 10*3/uL — ABNORMAL HIGH (ref 4.0–10.5)
nRBC: 0 % (ref 0.0–0.2)

## 2023-02-13 MED ORDER — KETOROLAC TROMETHAMINE 10 MG PO TABS
10.0000 mg | ORAL_TABLET | Freq: Three times a day (TID) | ORAL | Status: DC
  Administered 2023-02-13 – 2023-02-15 (×7): 10 mg via ORAL
  Filled 2023-02-13 (×8): qty 1

## 2023-02-13 MED ORDER — LINEZOLID 600 MG PO TABS
600.0000 mg | ORAL_TABLET | Freq: Two times a day (BID) | ORAL | Status: DC
  Administered 2023-02-13 – 2023-02-15 (×5): 600 mg via ORAL
  Filled 2023-02-13 (×6): qty 1

## 2023-02-13 MED ORDER — METRONIDAZOLE 500 MG PO TABS
500.0000 mg | ORAL_TABLET | Freq: Two times a day (BID) | ORAL | Status: AC
  Administered 2023-02-13: 500 mg via ORAL
  Filled 2023-02-13: qty 1

## 2023-02-13 NOTE — Progress Notes (Signed)
IV team consult placed for PIV access for IV antibiotics. This RN to bedside at 1220 to assess, pt with tunneled CVC removed earlier today.  Bilateral forearms with wounds.  Pt's antibiotic has been switched to PO at this time and only scheduled for IV toradol. [place] send to attending to inquire about having all medications switched to PO.  1640- MD updated this RN that IV not needed at this time and medications will be switched to PO.

## 2023-02-13 NOTE — Progress Notes (Signed)
Subjective: Patient was seen and examined at bedside this morning. She reports improvement in her neck pain as well as improvement in her left hand weakness. Neurosurgery reassessed the patient this morning and agreed with her improved weakness. She had symmetrical sensation on the upper extremities today. We discussed plan to remove the central line today for 72 hour line holiday and patient has agreed to current plan.  Objective:  Vital signs in last 24 hours: Vitals:   02/12/23 0752 02/12/23 1548 02/12/23 2020 02/13/23 0441  BP: 97/65 (!) 142/82 (!) 140/93 139/81  Pulse: 80 89 96 83  Resp: 16 16 17    Temp: 98.6 F (37 C) 99.1 F (37.3 C) 99.6 F (37.6 C) 98.5 F (36.9 C)  TempSrc: Oral Oral Oral Oral  SpO2: 100% 100% 100% (!) 89%   Weight change:   Intake/Output Summary (Last 24 hours) at 02/13/2023 1610 Last data filed at 02/12/2023 2224 Gross per 24 hour  Intake 240 ml  Output --  Net 240 ml   Supplemental Oxygen: Room Air SpO2:   Physical Exam Constitutional:      General: She is not in acute distress.    Appearance: She is obese.  HENT:     Head: Normocephalic and atraumatic.     Mouth/Throat:     Mouth: Mucous membranes are moist.  Cardiovascular:     Rate and Rhythm: Normal rate and regular rhythm.     Pulses: Normal pulses.     Heart sounds: Normal heart sounds.  Pulmonary:     Effort: Pulmonary effort is normal.     Breath sounds: Normal breath sounds.  Chest:     Comments: Central line in place with no surrounding tenderness, edema or erythema Abdominal:     General: Abdomen is flat.  Musculoskeletal:     Right lower leg: No edema.     Left lower leg: No edema.  Skin:    General: Skin is warm.     Comments: Forearm wounds wrapped in kerlex. No oozing or discharge observed.  Neurological:     Mental Status: She is alert and oriented to person, place, and time.      Assessment/Plan:  Principal Problem:   Epidural abscess Active  Problems:   Opioid use disorder   Arm wound   MRSA bacteremia  Epidural Abscess C4-C7 Secondary to MRSA Bacteremia Patient has been afebrile with complaints of neck pain along with muscle tightness. Leukocytosis on arrival of 17.8 that is continuing to improve. Lactic acid is 0.4. Cervical MRI showed epidural phlegmonous inflammation from upper C4 to upper C7. Effacement of the subarachnoid space surrounding the cord with flattening of the cord. No evidence of osteomyelitis. Possible posterior discitis at C5-6 which has decompressed into the epidural space, not definitely the origin Repeat MRI on 10/21 showed unchanged associated compression of the spinal cord -Neurosurgery does not recommend any surgical intervention at this time.  -Blood and urine cultures drawn 10/16. Blood culture x3 grew MRSA. Repeat cultures drawn 10/18 have continued to be negative. ID recommends continuing with IV Vancomycin for at least one week before attempting line holiday. Today is Day 6 of Vanc. ID follow-up recommended pulling the central line now for line holiday of 72 hours. -TTE ordered to r/o infective endocarditis. TTE is normal. ID does not recommend TEE due to risk of paralysis given epidural abscess. -Continue to trend CBC -Pain control with stepwise therapy: Tylenol 1000 mg  Q4 PRN for mild pain, Oxycodone 15 mg Q4  PRN for severe pain 1-2 tablets Q4 PRN, Dilaudid 2 mg Q3 PRN for breakthrough pain. Toradol added at 15 mg q6hrs which pt states helps.  -Also on methadone 55 mg tid (home dose 165 mg daily) -Lidocaine patch for neck pain -Robaxin 1000 QID for muscle tightness -Neurosurgery was contacted again for the left arm weakness. Repeat MRI did not show progression or need for intervention at this time. -IR team consulted for removal of central line 10/22. Successfully removed. -Since patient does not have other IV access, we have discussed oral medication coverage during the line holiday with pharmacy.  Will transition patient to Linezolid 600 BID for 5 weeks.   Left Hand Numbness Bilateral Lower Extremity Muscle Cramps Patient reports intermitted lower extremity cramps and some decreased sensation of the left lateral hand. Strength is 4/5 in the left arm. With 4/5 grip strength in the left hand. Patient reports that her weakness has improved. Patient B12 and Mg WNL -Will continue to monitor symptoms.  -Neurosurgery recommended repeat cervical MRI. Repeat MRI did not show any changes with possible C5/C6 ACD and patient does not need intervention at this time. They reassessed her and agreed that weakness is improving.     Chronic Skin Wounds, Bilateral UE Hx of Anxiety Hx of Depression Has bilateral skin wounds on the forearms that has required debridement and skin grafting in the past. Patient reports skin picking due to anxiety. -Wound care consulted and applying topical treatments and bandages. WC recommends patient continue her OP follow-up with Dr. Lajoyce Corners   Microcytic Anemia Hgb has been downtrending for the past four months. Most recent Hgb was 7.5, around her baseline. MCV is 61.6. Suspect this is secondary to IDA and her bacteremia. No signs of bleeding reported. Patient got 1 unit of pRBC during her stay with appropriate increase. Iron 13, TIBC 3, and Ferritin 17. -Given acute infection involving the spine. recommend iron supplementation outpatient -Will transfuse as needed for Hgb <7   Trichomoniasis Positive urine analysis but patient denies any recent sexual activity or prior infection. -Patient is already covered with Flagyl treatment. Will continue her last day of Metronidazole 500 po BID treatment.   Thrombocytosis Platelet 489 on admission and continuing to decrease. 394 today. Likely elevated in the setting of infection. Platelet smear shows normal morphology. -Will continue to monitor   Tobacco Use Disorder Patient smokes 1/4 ppd for roughly 16 years -Nicotine patch 14  mg   Intermittent Hypoxia while Sleeping Patient does not have a formal diagnosis of OSA. STOP BANG score is 3. She endorses some daytime fatigue, headache, and heavy snoring during sleep. -Recommend sleep studies outpatient -Supplemental O2 at bedtime PRN   Diet: Normal IVF: None,None VTE: Heparin Code: Full PT/OT recs: pending Prior to Admission Living Arrangement: Home Anticipated Discharge Location: Home Barriers to Discharge: Medical Stability Dispo: Anticipated discharge in approximately TBD day(s).    LOS: 6 days   Scherrie November, Medical Student 02/13/2023, 7:12 AM

## 2023-02-13 NOTE — Consult Note (Signed)
Reason for Consult:BUE wounds Referring Physician: Johny Sax Time called: 1610 Time at bedside: 0921   Lauren Allen is an 32 y.o. female.  HPI: Lauren Allen was admitted 6d ago with cervical epidural abscesses. She has had a left proximal FA wounds that have been treated by Dr. Lajoyce Corners and has been progressing well but has also developed one on the right proximal FA and orthopedic surgery was asked to see. She believes she's making good progress with the wounds and does not have significant pain.  Past Medical History:  Diagnosis Date   Anxiety    Crohn's disease (HCC)    Depression    Headache(784.0)    Ileitis    Opiate addiction (HCC)     Past Surgical History:  Procedure Laterality Date   COLONOSCOPY     HAND SURGERY Left    I & D EXTREMITY Left 11/06/2016   Procedure: IRRIGATION AND DEBRIDEMENT LEFT HAND;  Surgeon: Bradly Bienenstock, MD;  Location: [facility] OR;  Service: Orthopedics;  Laterality: Left;   I & D EXTREMITY Left 11/08/2016   Procedure: IRRIGATION AND DEBRIDEMENT LEFT HAND;  Surgeon: Bradly Bienenstock, MD;  Location: [facility] OR;  Service: Orthopedics;  Laterality: Left;   I & D EXTREMITY Left 10/11/2022   Procedure: IRRIGATION AND DEBRIDEMENT OF ARM;  Surgeon: Nadara Mustard, MD;  Location: [facility] OR;  Service: Orthopedics;  Laterality: Left;   I & D EXTREMITY Left 10/13/2022   Procedure: DEBRIDEMENT LEFT FOREARM;  Surgeon: Nadara Mustard, MD;  Location: [facility] OR;  Service: Orthopedics;  Laterality: Left;   IR FLUORO GUIDE CV LINE RIGHT  02/08/2023   IR US GUIDE VASC ACCESS RIGHT  02/08/2023   TONSILLECTOMY      Family History  Problem Relation Age of Onset   Colon cancer Maternal Grandfather    Celiac disease Maternal Aunt    Diabetes Maternal Grandmother    Hypothyroidism Maternal Grandmother    Heart disease Unknown    Diabetes Mother    Hypertension Mother    Hypothyroidism Mother    Hypertension Father    Heart disease Father    Hypothyroidism Paternal Grandmother      Social History:  reports that she has been smoking cigarettes. She has never used smokeless tobacco. She reports that she does not currently use alcohol. She reports that she does not currently use drugs.  Allergies:  Allergies  Allergen Reactions   Sulfa Antibiotics Palpitations    Medications: I have reviewed the patient's current medications.  Results for orders placed or performed during the hospital encounter of 02/07/23 (from the past 48 hour(s))  CBC with Differential/Platelet     Status: Abnormal   Collection Time: 02/12/23  4:16 AM  Result Value Ref Range   WBC 12.4 (H) 4.0 - 10.5 K/uL   RBC 4.22 3.87 - 5.11 MIL/uL   Hemoglobin 7.5 (L) 12.0 - 15.0 g/dL    Comment: Reticulocyte Hemoglobin testing may be clinically indicated, consider ordering this additional test [id number]    HCT 27.2 (L) 36.0 - 46.0 %   MCV 64.5 (L) 80.0 - 100.0 fL   MCH 17.8 (L) 26.0 - 34.0 pg   MCHC 27.6 (L) 30.0 - 36.0 g/dL   RDW 81.1 (H) 91.4 - 78.2 %   Platelets 412 (H) 150 - 400 K/uL    Comment: REPEATED TO VERIFY   nRBC 0.0 0.0 - 0.2 %   Neutrophils Relative % 69 %   Neutro Abs 8.7 (H) 1.7 - 7.7 K/uL  Lymphocytes Relative 18 %   Lymphs Abs 2.2 0.7 - 4.0 K/uL   Monocytes Relative 7 %   Monocytes Absolute 0.8 0.1 - 1.0 K/uL   Eosinophils Relative 1 %   Eosinophils Absolute 0.2 0.0 - 0.5 K/uL   Basophils Relative 1 %   Basophils Absolute 0.1 0.0 - 0.1 K/uL   Immature Granulocytes 4 %   Abs Immature Granulocytes 0.49 (H) 0.00 - 0.07 K/uL   Polychromasia PRESENT     Comment: Performed at [facility] Lab, [address] [place], [place] [address]  Vancomycin, random     Status: None   Collection Time: 02/12/23  1:56 PM  Result Value Ref Range   Vancomycin Rm 16 ug/mL    Comment:        Random Vancomycin therapeutic range is dependent on dosage and time of specimen collection. A peak range is 20-40 ug/mL A trough range is 5-15 ug/mL        Performed at [facility],  [address] [place], [place] [address]   CBC with Differential/Platelet     Status: Abnormal   Collection Time: 02/13/23  3:08 AM  Result Value Ref Range   WBC 11.1 (H) 4.0 - 10.5 K/uL   RBC 4.24 3.87 - 5.11 MIL/uL   Hemoglobin 7.5 (L) 12.0 - 15.0 g/dL    Comment: Reticulocyte Hemoglobin testing may be clinically indicated, consider ordering this additional test [id number]    HCT 27.3 (L) 36.0 - 46.0 %   MCV 64.4 (L) 80.0 - 100.0 fL   MCH 17.7 (L) 26.0 - 34.0 pg   MCHC 27.5 (L) 30.0 - 36.0 g/dL   RDW 78.2 (H) 95.6 - 21.3 %   Platelets 394 150 - 400 K/uL    Comment: REPEATED TO VERIFY   nRBC 0.0 0.0 - 0.2 %   Neutrophils Relative % 64 %   Neutro Abs 7.2 1.7 - 7.7 K/uL   Lymphocytes Relative 23 %   Lymphs Abs 2.5 0.7 - 4.0 K/uL   Monocytes Relative 6 %   Monocytes Absolute 0.7 0.1 - 1.0 K/uL   Eosinophils Relative 2 %   Eosinophils Absolute 0.2 0.0 - 0.5 K/uL   Basophils Relative 1 %   Basophils Absolute 0.1 0.0 - 0.1 K/uL   Immature Granulocytes 4 %   Abs Immature Granulocytes 0.49 (H) 0.00 - 0.07 K/uL    Comment: Performed at [facility], [address] [place], [place] [address]  Comprehensive metabolic panel     Status: Abnormal   Collection Time: 02/13/23  3:08 AM  Result Value Ref Range   Sodium 138 135 - 145 mmol/L   Potassium 3.8 3.5 - 5.1 mmol/L   Chloride 100 98 - 111 mmol/L   CO2 26 22 - 32 mmol/L   Glucose, Bld 99 70 - 99 mg/dL    Comment: Glucose reference range applies only to samples taken after fasting for at least 8 hours.   BUN 9 6 - 20 mg/dL   Creatinine, Ser 8.46 0.44 - 1.00 mg/dL   Calcium 9.3 8.9 - 96.2 mg/dL   Total Protein 6.6 6.5 - 8.1 g/dL   Albumin 2.5 (L) 3.5 - 5.0 g/dL   AST 18 15 - 41 U/L   ALT 25 0 - 44 U/L   Alkaline Phosphatase 136 (H) 38 - 126 U/L   Total Bilirubin 0.4 0.3 - 1.2 mg/dL   GFR, Estimated >95 >28 mL/min    Comment: (  NOTE) Calculated using the CKD-EPI Creatinine Equation (2021)    Anion gap 12 5 - 15    Comment:  Performed at [facility], [address], [place], [place] [address]    MR CERVICAL SPINE W WO CONTRAST  Result Date: 02/12/2023 CLINICAL DATA:  Cervical radiculopathy, infection suspected, positive xray/CT cervical abscess. EXAM: MRI CERVICAL SPINE WITHOUT AND WITH CONTRAST TECHNIQUE: Multiplanar and multiecho pulse sequences of the cervical spine, to include the craniocervical junction and cervicothoracic junction, were obtained without and with intravenous contrast. CONTRAST:  10mL GADAVIST GADOBUTROL 1 MMOL/ML IV SOLN COMPARISON:  Cervical spine MRI 02/07/2023. FINDINGS: Alignment: Normal. Vertebrae: Findings of C5-6 discitis with associated ventral epidural phlegmon with developing abscess, measuring up to 5 mm in thickness (sagittal image 10 series 2). Unchanged associated compression of the spinal cord. No definite spinal cord edema within limits of motion artifact. Cord: As above. Posterior Fossa, vertebral arteries, paraspinal tissues: Trace prevertebral edema. Disc levels: No significant degenerative change. IMPRESSION: Findings of C5-6 discitis with associated ventral epidural phlegmon and developing abscess, measuring up to 5 mm in thickness. Unchanged associated compression of the spinal cord. No definite spinal cord edema within limits of motion artifact. Electronically Signed   By: Orvan Falconer M.D.   On: 02/12/2023 18:28    Review of Systems  HENT:  Negative for ear discharge, ear pain, hearing loss and tinnitus.   Eyes:  Negative for photophobia and pain.  Respiratory:  Negative for cough and shortness of breath.   Cardiovascular:  Negative for chest pain.  Gastrointestinal:  Negative for abdominal pain, nausea and vomiting.  Genitourinary:  Negative for dysuria, flank pain, frequency and urgency.  Musculoskeletal:  Positive for neck pain. Negative for back pain and myalgias.  Skin:  Positive for wound (Bilateral UE).  Neurological:  Negative for dizziness and headaches.   Hematological:  Does not bruise/bleed easily.  Psychiatric/Behavioral:  The patient is not nervous/anxious.    Blood pressure 139/81, pulse 83, temperature 98.5 F (36.9 C), temperature source Oral, resp. rate 17, last menstrual period 01/09/2023, SpO2 (!) 89%. Physical Exam Constitutional:      General: She is not in acute distress.    Appearance: She is well-developed. She is not diaphoretic.  HENT:     Head: Normocephalic and atraumatic.  Eyes:     General: No scleral icterus.       Right eye: No discharge.        Left eye: No discharge.     Conjunctiva/sclera: Conjunctivae normal.  Cardiovascular:     Rate and Rhythm: Normal rate and regular rhythm.  Pulmonary:     Effort: Pulmonary effort is normal. No respiratory distress.  Musculoskeletal:     Cervical back: Normal range of motion.     Comments: Bilateral shoulder, elbow, wrist, digits- Large wounds bilateral prox FA's without sig depth, excellent granulation, no e/o abscess, minimal to no TTP, no instability, no blocks to motion  Sens  Ax/R/M/U intact  Mot   Ax/ R/ PIN/ M/ AIN/ U intact  Rad 2+  Skin:    General: Skin is warm and dry.  Neurological:     Mental Status: She is alert.  Psychiatric:        Mood and Affect: Mood normal.        Behavior: Behavior normal.     Assessment/Plan: Bilateral FA wounds -- Continue plan as outlined by WOC RN and f/u with Dr. Lajoyce Corners as already scheduled.    Nolon Bussing.  Marg[facility] Orthopedic Surgery [phone] 02/13/2023, 9:32 AM

## 2023-02-13 NOTE — Progress Notes (Signed)
Patient ID: Lauren Allen, female   DOB: 1991/03/21, 32 y.o.   MRN: [id number] BP 127/85 (BP Location: Right Arm)   Pulse 93   Temp 98.5 F (36.9 C) (Oral)   Resp 18   LMP 01/09/2023 (Exact Date)   SpO2 100%  No change in triceps/biceps. Watching carefully. Will continue to monitor and ready to proceed with operative decompression if needed.

## 2023-02-13 NOTE — Procedures (Signed)
PROCEDURE SUMMARY:  Successful removal of tunneled central venous catheter.  No complications. EBL = trace   Please see full dictation in imaging section of Epic for procedure details.   Lynann Bologna Brixton Franko PA-C 02/13/2023 10:24 AM

## 2023-02-14 DIAGNOSIS — S41102A Unspecified open wound of left upper arm, initial encounter: Secondary | ICD-10-CM

## 2023-02-14 DIAGNOSIS — S41101A Unspecified open wound of right upper arm, initial encounter: Secondary | ICD-10-CM

## 2023-02-14 LAB — CBC WITH DIFFERENTIAL/PLATELET
Abs Immature Granulocytes: 0.48 10*3/uL — ABNORMAL HIGH (ref 0.00–0.07)
Basophils Absolute: 0.1 10*3/uL (ref 0.0–0.1)
Basophils Relative: 1 %
Eosinophils Absolute: 0.2 10*3/uL (ref 0.0–0.5)
Eosinophils Relative: 2 %
HCT: 30.1 % — ABNORMAL LOW (ref 36.0–46.0)
Hemoglobin: 8.3 g/dL — ABNORMAL LOW (ref 12.0–15.0)
Immature Granulocytes: 4 %
Lymphocytes Relative: 23 %
Lymphs Abs: 2.6 10*3/uL (ref 0.7–4.0)
MCH: 17.8 pg — ABNORMAL LOW (ref 26.0–34.0)
MCHC: 27.6 g/dL — ABNORMAL LOW (ref 30.0–36.0)
MCV: 64.6 fL — ABNORMAL LOW (ref 80.0–100.0)
Monocytes Absolute: 0.6 10*3/uL (ref 0.1–1.0)
Monocytes Relative: 5 %
Neutro Abs: 7.6 10*3/uL (ref 1.7–7.7)
Neutrophils Relative %: 65 %
Platelets: 406 10*3/uL — ABNORMAL HIGH (ref 150–400)
RBC: 4.66 MIL/uL (ref 3.87–5.11)
RDW: 24.4 % — ABNORMAL HIGH (ref 11.5–15.5)
WBC: 11.6 10*3/uL — ABNORMAL HIGH (ref 4.0–10.5)
nRBC: 0 % (ref 0.0–0.2)

## 2023-02-14 LAB — CULTURE, BLOOD (ROUTINE X 2)
Culture: NO GROWTH
Culture: NO GROWTH
Special Requests: ADEQUATE

## 2023-02-14 NOTE — Progress Notes (Signed)
Patient ID: Lauren Allen, female   DOB: [birth year], 32 y.o.   MRN: [id number] BP (!) 130/92 (BP Location: Right Arm)   Pulse 95   Temp 98 F (36.7 C) (Oral)   Resp 20   Ht 5\' 3"  (1.6 m)   Wt 104 kg   LMP 01/09/2023 (Exact Date)   SpO2 100%   BMI 40.61 kg/m  Alert and oriented x 4 Biceps and triceps strength on left continue to improve.  Ok for discharge. I will see in the office in two weeks.  Spoke with patient this evening.

## 2023-02-14 NOTE — Plan of Care (Signed)
  Problem: Activity: Goal: Ability to perform//tolerate increased activity and mobilize with assistive devices will improve Outcome: Progressing   Problem: Self-Care: Goal: Ability to meet self-care needs will improve Outcome: Progressing   Problem: Pain Management: Goal: Pain level will decrease with appropriate interventions Outcome: Progressing   Problem: Skin Integrity: Goal: Demonstration of wound healing without infection will improve Outcome: Progressing   Problem: Education: Goal: Knowledge of General Education information will improve Description: Including pain rating scale, medication(s)/side effects and non-pharmacologic comfort measures Outcome: Progressing   Problem: Clinical Measurements: Goal: Respiratory complications will improve Outcome: Progressing Goal: Cardiovascular complication will be avoided Outcome: Progressing   Problem: Activity: Goal: Risk for activity intolerance will decrease Outcome: Progressing   Problem: Nutrition: Goal: Adequate nutrition will be maintained Outcome: Progressing   Problem: Coping: Goal: Level of anxiety will decrease Outcome: Progressing   Problem: Pain Managment: Goal: General experience of comfort will improve Outcome: Progressing   Problem: Safety: Goal: Ability to remain free from injury will improve Outcome: Progressing
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
## 2023-02-14 NOTE — Discharge Instructions (Addendum)
Hello Ms. Sweeley, you were admitted for an epidural abscess, which is a collection of infection, in your cervical spine. We also found a bacteria in your blood that was likely causing the infection in your cervical spine. We were treating you with IV antibiotics and switched you to an oral antibiotic, Linezolid.  Please continue to take Linezolid 600 mg twice a day for 5 weeks. We will also give you a short dose of the pain medications to help with your symptoms.  We would like to see you in our Internal Medicine Clinic within 1-2 weeks.  Please schedule the following appointments:  An appointment with Dr. Lajoyce Corners to follow your arm wounds. You will receive a call to schedule your IM Clinic appointment. An appointment with [facility] to follow your epidural abscess.  Please seek medical attention if you develop any of the following symptoms: worsening numbness or tingling, worsening weakness, coordination issues, balance issues, difficulty walking, or you have changes in your bowel or bladder function. These symptoms may indicate that there is worsening of the infection in your spine that is affecting the surrounding nerves.  It was a pleasure taking care of you!  Scherrie November  Internal Medicine

## 2023-02-14 NOTE — Progress Notes (Addendum)
Subjective: Patient was seen and examined at bedside. She says she is ready to go home and improving in terms of her pain. Patient would like to [facility] after [facility], and we discussed the possibility of discharge today pending neurosurgery clearance. We discussed further STI testing due to patient's positive trichomoniasis. She would like further testing.  Objective:  Vital signs in last 24 hours: Vitals:   02/13/23 0441 02/13/23 1500 02/13/23 2100 02/14/23 0358  BP: 139/81 127/85 (!) 121/91 (!) 103/57  Pulse: 83 93 95 75  Resp:  18 18 18   Temp: 98.5 F (36.9 C)  99.1 F (37.3 C) 98.5 F (36.9 C)  TempSrc: Oral Oral Oral Oral  SpO2: (!) 89% 100% 96% 97%   Weight change:   Intake/Output Summary (Last 24 hours) at 02/14/2023 0734 Last data filed at 02/13/2023 2100 Gross per 24 hour  Intake 720 ml  Output --  Net 720 ml    Physical Exam Constitutional:      Appearance: She is obese.  HENT:     Head: Normocephalic.  Cardiovascular:     Rate and Rhythm: Normal rate and regular rhythm.     Pulses: Normal pulses.     Heart sounds: Normal heart sounds.  Pulmonary:     Effort: Pulmonary effort is normal.     Breath sounds: Normal breath sounds.  Chest:     Comments: Central line has been removed. No tenderness, erythema, or edam noted around gauze. Musculoskeletal:     Right lower leg: No edema.     Left lower leg: No edema.  Neurological:     Mental Status: She is alert and oriented to person, place, and time.      Assessment/Plan: Principal Problem:   Epidural abscess Active Problems:   Opioid use disorder   Arm wound   MRSA bacteremia   Epidural Abscess C4-C7 Secondary to MRSA Bacteremia Patient has been afebrile with complaints of neck pain along with muscle tightness. Leukocytosis on arrival of 17.8 that is continuing to improve. Lactic acid is 0.4. Cervical MRI showed epidural phlegmonous inflammation from upper C4 to  upper C7. Effacement of the subarachnoid space surrounding the cord with flattening of the cord. No evidence of osteomyelitis. Possible posterior discitis at C5-6 which has decompressed into the epidural space, not definitely the origin Repeat MRI on 10/21 showed unchanged associated compression of the spinal cord -Neurosurgery does not recommend any surgical intervention at this time.  -Blood and urine cultures drawn 10/16. Blood culture x3 grew MRSA. Repeat cultures drawn 10/18 have continued to be negative. ID recommends continuing with IV Vancomycin for at least one week before attempting line holiday. ID follow-up recommended pulling the central line now for line holiday of 72 hours. -TTE ordered to r/o infective endocarditis. TTE is normal. ID does not recommend TEE due to risk of paralysis given epidural abscess. -Continue to trend CBC -Pain control with stepwise therapy: Tylenol 1000 mg  Q4 PRN for mild pain, Oxycodone 15 mg Q4 PRN for severe pain 1-2 tablets Q4 PRN, Dilaudid 2 mg Q3 PRN for breakthrough pain. Toradol added at 15 mg q6hrs which pt states helps.  -Also on methadone 55 mg tid (home dose 165 mg daily) -Lidocaine patch for neck pain -Neurosurgery was contacted again for the left arm weakness. Repeat MRI did not show progression or need for intervention at this time. -IR team consulted for removal of central line 10/22. Successfully removed. -Since patient  does not have other IV access, we have discussed oral medication coverage during the line holiday with pharmacy. -Patient transitioned to Linezolid 600 BID for 5 weeks -Pain medications transitioned to po since patient does not have IV access.   Left Hand Numbness Bilateral Lower Extremity Muscle Cramps Patient reports intermitted lower extremity cramps and some decreased sensation of the left lateral hand. Strength is 4/5 in the left arm. With 4/5 grip strength in the left hand. Patient reports that her weakness has  improved. Patient B12 and Mg WNL -Will continue to monitor symptoms.  -Neurosurgery recommended repeat cervical MRI. Repeat MRI did not show any changes with possible C5/C6 ACD and patient does not need intervention at this time. They reassessed her and agreed that weakness is improving. -Neurosurgery continuing to monitor symptoms and will proceed with operative decompression if needed. We are awaiting their sign-off for discharge but she is medically stable for discharge from our standpoint.     Chronic Skin Wounds, Bilateral UE Hx of Anxiety Hx of Depression Has bilateral skin wounds on the forearms that has required debridement and skin grafting in the past. Patient reports skin picking due to anxiety. -Wound care consulted and applying topical treatments and bandages. WC recommends patient continue her OP follow-up with Dr. Lajoyce Corners -Dr. Lajoyce Corners assessed patient and wants her to follow OP. She is able to be discharged with Silvadene dressing changes.   Microcytic Anemia Hgb has been downtrending for the past four months. Baseline has been 7.5. MCV is 61.6. Suspect this is secondary to IDA and her bacteremia. No signs of bleeding reported. Patient got 1 unit of pRBC during her stay with appropriate increase. Iron 13, TIBC 3, and Ferritin 17. -Given acute infection involving the spine. recommend iron supplementation outpatient -Will transfuse as needed for Hgb <7   Trichomoniasis Positive urine analysis but patient denies any recent sexual activity or prior infection. -Patient completed 7 day course of Metronidazole 500 BID -Patient would like to have gonorrhea, chlamydia, and syphilis testing done. Ordered.   Thrombocytosis Platelet 489 on admission and continuing to decrease. 406 today. Likely elevated in the setting of infection. Platelet smear shows normal morphology. -Will continue to monitor   Tobacco Use Disorder Patient smokes 1/4 ppd for roughly 16 years -Nicotine patch 14 mg    Intermittent Hypoxia while Sleeping Patient does not have a formal diagnosis of OSA. STOP BANG score is 3. She endorses some daytime fatigue, headache, and heavy snoring during sleep. -Recommend sleep studies outpatient -Supplemental O2 at bedtime PRN   Diet: Normal IVF: None,None VTE: Heparin Code: Full PT/OT recs: pending Prior to Admission Living Arrangement: Home Anticipated Discharge Location: Home Dispo: Patient is medically cleared for discharge from our standpoint. We are awaiting neurosurgery clearance for discharge.  Scherrie November, MS4  LOS: 7 days   Scherrie November, Medical Student 02/14/2023, 7:34 AM

## 2023-02-15 ENCOUNTER — Other Ambulatory Visit (HOSPITAL_COMMUNITY): Payer: Self-pay

## 2023-02-15 LAB — CBC WITH DIFFERENTIAL/PLATELET
Abs Immature Granulocytes: 0 10*3/uL (ref 0.00–0.07)
Basophils Absolute: 0 10*3/uL (ref 0.0–0.1)
Basophils Relative: 0 %
Eosinophils Absolute: 0.5 10*3/uL (ref 0.0–0.5)
Eosinophils Relative: 4 %
HCT: 33.3 % — ABNORMAL LOW (ref 36.0–46.0)
Hemoglobin: 9.1 g/dL — ABNORMAL LOW (ref 12.0–15.0)
Lymphocytes Relative: 21 %
Lymphs Abs: 2.4 10*3/uL (ref 0.7–4.0)
MCH: 17.6 pg — ABNORMAL LOW (ref 26.0–34.0)
MCHC: 27.3 g/dL — ABNORMAL LOW (ref 30.0–36.0)
MCV: 64.4 fL — ABNORMAL LOW (ref 80.0–100.0)
Monocytes Absolute: 0.3 10*3/uL (ref 0.1–1.0)
Monocytes Relative: 3 %
Neutro Abs: 8.2 10*3/uL — ABNORMAL HIGH (ref 1.7–7.7)
Neutrophils Relative %: 72 %
Platelets: 473 10*3/uL — ABNORMAL HIGH (ref 150–400)
RBC: 5.17 MIL/uL — ABNORMAL HIGH (ref 3.87–5.11)
RDW: 25.1 % — ABNORMAL HIGH (ref 11.5–15.5)
WBC: 11.4 10*3/uL — ABNORMAL HIGH (ref 4.0–10.5)
nRBC: 0 % (ref 0.0–0.2)
nRBC: 0 /100{WBCs}

## 2023-02-15 LAB — GC/CHLAMYDIA PROBE AMP (~~LOC~~) NOT AT ARMC
Chlamydia: NEGATIVE
Comment: NEGATIVE
Comment: NORMAL
Neisseria Gonorrhea: NEGATIVE

## 2023-02-15 LAB — RPR: RPR Ser Ql: NONREACTIVE

## 2023-02-15 MED ORDER — NALOXONE HCL 4 MG/0.1ML NA LIQD
NASAL | 0 refills | Status: AC
  Filled 2023-02-15: qty 2, 14d supply, fill #0

## 2023-02-15 MED ORDER — NICOTINE 14 MG/24HR TD PT24
14.0000 mg | MEDICATED_PATCH | Freq: Every day | TRANSDERMAL | 0 refills | Status: AC
  Filled 2023-02-15: qty 28, 28d supply, fill #0

## 2023-02-15 MED ORDER — OXYCODONE HCL 15 MG PO TABS
15.0000 mg | ORAL_TABLET | Freq: Four times a day (QID) | ORAL | 0 refills | Status: AC | PRN
  Filled 2023-02-15: qty 12, 3d supply, fill #0

## 2023-02-15 MED ORDER — CYCLOBENZAPRINE HCL 10 MG PO TABS
10.0000 mg | ORAL_TABLET | Freq: Two times a day (BID) | ORAL | 0 refills | Status: AC | PRN
  Filled 2023-02-15: qty 28, 14d supply, fill #0

## 2023-02-15 MED ORDER — METHOCARBAMOL 500 MG PO TABS
1000.0000 mg | ORAL_TABLET | Freq: Four times a day (QID) | ORAL | 0 refills | Status: AC
  Filled 2023-02-15: qty 112, 14d supply, fill #0

## 2023-02-15 MED ORDER — HYDROMORPHONE HCL 2 MG PO TABS
2.0000 mg | ORAL_TABLET | Freq: Two times a day (BID) | ORAL | 0 refills | Status: AC | PRN
  Filled 2023-02-15: qty 6, 3d supply, fill #0

## 2023-02-15 MED ORDER — BISACODYL 5 MG PO TBEC
10.0000 mg | DELAYED_RELEASE_TABLET | Freq: Every day | ORAL | 0 refills | Status: AC | PRN
  Filled 2023-02-15: qty 30, 15d supply, fill #0

## 2023-02-15 MED ORDER — METHADONE HCL 5 MG PO TABS
165.0000 mg | ORAL_TABLET | Freq: Once | ORAL | Status: AC
  Administered 2023-02-15: 165 mg via ORAL
  Filled 2023-02-15: qty 1

## 2023-02-15 MED ORDER — NALOXONE HCL 0.4 MG/ML IJ SOLN
0.4000 mg | INTRAMUSCULAR | 0 refills | Status: AC | PRN
  Filled 2023-02-15: qty 1, fill #0

## 2023-02-15 MED ORDER — LINEZOLID 600 MG PO TABS
600.0000 mg | ORAL_TABLET | Freq: Two times a day (BID) | ORAL | 0 refills | Status: AC
  Filled 2023-02-15: qty 28, 14d supply, fill #0

## 2023-02-15 MED ORDER — ACETAMINOPHEN 500 MG PO TABS: 1000.0000 mg | ORAL_TABLET | Freq: Four times a day (QID) | ORAL | Status: DC

## 2023-02-15 MED ORDER — MELATONIN 5 MG PO TABS
5.0000 mg | ORAL_TABLET | Freq: Every day | ORAL | 0 refills | Status: AC
  Filled 2023-02-15: qty 30, 30d supply, fill #0

## 2023-02-15 NOTE — Progress Notes (Addendum)
[facility]  Date of Admission:  02/07/2023   Total days of inpatient antibiotics 4  Principal Problem:   Epidural abscess Active Problems:   Opioid abuse (HCC)   Arm wound   MRSA bacteremia          Assessment: 32 year old female admitted with: #MRSA bacteremia with epidural abscess #History of prior arm abscesses - Patient found to have epidural phlegmonous inflammation C4-C7.  Found to have MRSA bacteremia in the setting of IVDA. - Neurosurgery was engaged, no plans for operative management as no neurological deficit at that time. - Since yesterday patient has had some left forearm weakness.  MRI cervical spine reordered. - Of note she had a central line placed on 10/17 prior to sending blood cultures clearance on 10/18.  Blood culture on 10/18 remain negative -Patient injects into her arms, suspect that is the source of her bacteremia complicated by epidural abscess. #History of IVDA - Last snorted heroin week prior to admission, relapsed from IV drug use this summer in June. -MRI cervical spine showed C5-C6 discitis and ventral epidural phlegmon and developing abscess measuring 5 mm.  Neurosurgery was engaged, on 10/23 noted follow-up in 2 weeks.  Given cervical spine abscess unclear if TEE candidate.  TTE showed no vegetations -Not a home iV abx candidate, avoid PICC if possible. Recommendations: -Remove central line(placed 10/17), most on 10/22 - Peripheral IVs are difficult to obtain due to patient's age, transition to linezolid during line holiday. -Repeat blood cultures. -Spoke to patient today given bacteremia and epidural cervical abscess she would require 8 weeks antibiotics of those 8 weeks at least 3 weeks of IV therapy daily then we can possibly do Dalvance followed by linezolid to complete antibiotic course.  Patient stated that she is going to leave today, AMA.  Would not place line, she leaves AMA then give linezolid x 2 weeks  with ID follow-up in 2 weeks with Marcos Eke NP on 11/6.  Microbiology:   Antibiotics: Metronidazole 10/16-present Linezolid 10/16-17 Ciprofloxacin 10/16-17 Vancomycin 10/17-present Cultures: Blood 10/16 2/2 MRSA 10/18 no growth x 3 days Urine  Other   SUBJECTIVE: Sitting at the edge of the bed. Interval: Afebrile overnight. Wbc 11.4k  Review of Systems: Review of Systems  All other systems reviewed and are negative.    Scheduled Meds:  acetaminophen  1,000 mg Oral Q6H   docusate sodium  100 mg Oral Daily   heparin  5,000 Units Subcutaneous Q8H   ketorolac  10 mg Oral Q8H   lidocaine  1 patch Transdermal Q24H   linezolid  600 mg Oral Q12H   melatonin  5 mg Oral QHS   methocarbamol  1,000 mg Oral QID   nicotine  14 mg Transdermal Daily   oxyCODONE  15 mg Oral Q4H   polyethylene glycol  17 g Oral Daily   traZODone  50 mg Oral QHS   Continuous Infusions:   PRN Meds:.HYDROmorphone, mirtazapine, naLOXone (NARCAN)  injection Allergies  Allergen Reactions   Sulfa Antibiotics Palpitations    OBJECTIVE: Vitals:   02/14/23 1714 02/14/23 2050 02/15/23 0411 02/15/23 0800  BP: (!) 108/93 (!) 130/92 98/71 121/80  Pulse: 93 95 78 82  Resp: 18 20 18 18   Temp: 98.5 F (36.9 C) 98 F (36.7 C) 98.1 F (36.7 C) 98.3 F (36.8 C)  TempSrc: Oral Oral Oral Oral  SpO2: 98% 100% 99% 98%  Weight:      Height:  Body mass index is 40.61 kg/m.  Physical Exam Constitutional:      Appearance: Normal appearance.  HENT:     Head: Normocephalic and atraumatic.     Right Ear: Tympanic membrane normal.     Left Ear: Tympanic membrane normal.     Nose: Nose normal.     Mouth/Throat:     Mouth: Mucous membranes are moist.  Eyes:     Extraocular Movements: Extraocular movements intact.     Conjunctiva/sclera: Conjunctivae normal.     Pupils: Pupils are equal, round, and reactive to light.  Cardiovascular:     Rate and Rhythm: Normal rate and regular rhythm.      Heart sounds: No murmur heard.    No friction rub. No gallop.  Pulmonary:     Effort: Pulmonary effort is normal.     Breath sounds: Normal breath sounds.  Abdominal:     General: Abdomen is flat.     Palpations: Abdomen is soft.  Musculoskeletal:        General: Normal range of motion.  Skin:    General: Skin is warm and dry.  Neurological:     General: No focal deficit present.     Mental Status: She is alert and oriented to person, place, and time.  Psychiatric:        Mood and Affect: Mood normal.       Lab Results Lab Results  Component Value Date   WBC 11.4 (H) 02/15/2023   HGB 9.1 (L) 02/15/2023   HCT 33.3 (L) 02/15/2023   MCV 64.4 (L) 02/15/2023   PLT 473 (H) 02/15/2023    Lab Results  Component Value Date   CREATININE 0.66 02/13/2023   BUN 9 02/13/2023   NA 138 02/13/2023   K 3.8 02/13/2023   CL 100 02/13/2023   CO2 26 02/13/2023    Lab Results  Component Value Date   ALT 25 02/13/2023   AST 18 02/13/2023   ALKPHOS 136 (H) 02/13/2023   BILITOT 0.4 02/13/2023        Danelle Earthly, MD [facility] 02/15/2023, 12:10 PM I have personally spent 51 minutes involved in face-to-face and non-face-to-face activities for this patient on the day of the visit. Professional time spent includes the following activities: Preparing to see the patient (review of tests), Obtaining and/or reviewing separately obtained history (admission/discharge record), Performing a medically appropriate examination and/or evaluation , Ordering medications/tests/procedures, referring and communicating with other health care professionals, Documenting clinical information in the EMR, Independently interpreting results (not separately reported), Communicating results to the patient/family/caregiver, Counseling and educating the patient/family/caregiver and Care coordination (not separately reported).

## 2023-02-15 NOTE — Discharge Summary (Signed)
Name: Lauren Allen MRN: [id number] DOB: 1991-03-19 32 y.o. PCP: [facility]  Date of Admission: 02/07/2023  5:44 AM Date of Discharge: 02/15/2023 Attending Physician: Lauren Smart, MD  Discharge Diagnosis:  Epidural Abscess C4-C7 Secondary to MRSA Bacteremia Bilateral Arm Wounds Opioid Use Disorder   Discharge Medications: Allergies as of 02/15/2023       Reactions   Sulfa Antibiotics Palpitations        Medication List     STOP taking these medications    naproxen sodium 220 MG tablet Commonly known as: ALEVE       TAKE these medications    acetaminophen 500 MG tablet Commonly known as: TYLENOL Take 2 tablets (1,000 mg total) by mouth every 6 (six) hours.   bisacodyl 5 MG EC tablet Commonly known as: Dulcolax Take 2 tablets (10 mg total) by mouth daily as needed for moderate constipation.   cyclobenzaprine 10 MG tablet Commonly known as: FLEXERIL Take 1 tablet (10 mg total) by mouth 2 (two) times daily as needed for up to 14 days for muscle spasms.   HYDROmorphone 2 MG tablet Commonly known as: DILAUDID Take 1 tablet (2 mg total) by mouth 2 (two) times daily as needed for up to 3 days (breakthrough pain after other available modalities have been given).   linezolid 600 MG tablet Commonly known as: ZYVOX Take 1 tablet (600 mg total) by mouth 2 (two) times daily for 14 days.   melatonin 5 MG Tabs Take 1 tablet (5 mg total) by mouth at bedtime.   methadone 10 MG/ML solution Commonly known as: DOLOPHINE Take 165 mg by mouth daily.   methocarbamol 500 MG tablet Commonly known as: ROBAXIN Take 2 tablets (1,000 mg total) by mouth 4 (four) times daily for 14 days.   naloxone 0.4 MG/ML injection Commonly known as: NARCAN Inject 1 mL (0.4 mg total) into the vein as needed for up to 1 dose (If pt is unresponsive or if RR<8).   naproxen 500 MG tablet Commonly known as: NAPROSYN Take 1 tablet (500 mg  total) by mouth 2 (two) times daily.   nicotine 14 mg/24hr patch Commonly known as: NICODERM CQ - dosed in mg/24 hours Place 1 patch (14 mg total) onto the skin daily for 28 days. Start taking on: February 16, 2023   oxyCODONE 15 MG immediate release tablet Commonly known as: ROXICODONE Take 1 tablet (15 mg total) by mouth every 6 (six) hours as needed for up to 3 days for severe pain (pain score 7-10).               Discharge Allen Instructions  (From admission, onward)           Start     Ordered   02/15/23 0000  Discharge wound Allen:       Comments: Wound Allen  Every other day    Comments: Clean L arm wound with Normal Saline, apply silver hydrofiber Hart [place] [phone]) to wound bed every other day.  Cover with Telfa non stick pad, kerlix and Ace bandage to secure.  IF SILVER IS STUCK TO WOUND BED SOAK WITH Normal Saline FOR ATRAUMATIC REMOVAL.    Wound Allen  Daily      Comments: Clean R arm wound with Vashe wound cleanser Hart [place] [phone]), apply Vashe moistened gauze to entire wound bed daily, cover with dry gauze and Kerlix. Secure with Ace bandage  Call Lauren Allen office if you have questions regarding  wound Allen.   02/15/23 1109            Disposition and follow-up:   Lauren Allen was discharged from [facility] in Stable condition.  At the hospital follow up visit please address:  1.   Please address her left hand weakness and neck pain.  2.  Labs / imaging needed at time of follow-up: Repeat Cervical MRI at 2 weeks with Neurosurgery. CBC needs to be collected at time of follow-up.  3.  Pending labs/ test needing follow-up: Gonnorhea and Chlamydia results pending.  4. Patient Lauren follow with Lauren Allen for her bilateral forearm wounds and [facility] for her epidural abscess.  Follow-up Appointments:  Follow-up Information     Lauren Mustard, MD Follow up in 1 week(s).   Specialty: Orthopedic Surgery Contact  information: [address] [place] [address] [phone]         Lauren Memos, MD Follow up in 2 week(s).   Specialty: Neurosurgery Why: please call the office to make an appointment Contact information: [address] [place] [address] [phone]                 Hospital Course by problem list: Admitted 02/07/2023  Allergies: Sulfa antibiotics Pertinent Hx: OUD, GAD, MDD, Crohn's  HPI per Lauren Allen: Lauren Allen is a 32 y.o. F with PMH anxiety, depression, Crohn's disease, opioid use disorder who presented to [facility] ED with neck pain admitted for epidural abscess. She was evaluated 10/15 where she reported this neck pain worse with movement over the prior 2 days. She had a negative CT.C spine and because she was at [facility], was not able to have MRI done. She was advised to present to [facility] ED for MRI if she had no improvement of symptoms or developed fever, numbness, or weakness.   She explains that the pain in her neck started abruptly on Sunday afternoon. She noticed gradual onset on Sunday afternoon of a "weird" feeling in her back. She has not had weakness but is having a lot of pain. She has had sensation of "hot coals" on her skin. No fever, chills, new rashes, difficulty breathing, heart palpitations.   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 These have required I&D in the past, most recently June 2024 where she also had skin graft placed on LUE wound, and she performs daily wound Allen as OP.   Lauren Allen states that she previously had 4 years clean until several distressing life events this year (specifically, she explains that her son had an accident (was shot, is okay now though) caused her to "spiral" and relapse. She now is on methadone through [facility] where she picks up her  methadone daily Wednesday-Friday and has take home doses for Saturday-Tuesday. She also sees a therapist and is planning to start going to Narcotics Anonymous meetings so that she can get back on track. Her drug of choice is heroin which she states that she sniffs and has not used IV drugs since June.   MR Cervical spine: Epidural phlegmonous inflammation from upper C4 to upper C7. Effacement of the subarachnoid space surrounding the cord with flattening of the cord. No evidence of osteomyelitis. Possible posterior discitis at C5-6 which has decompressed into the epidural space, not definitely the origin. Neuro surgical consultation recommended for consideration of decompression.  Epidural Abscess C4-C7 Secondary to MRSA Bacteremia Patient has been afebrile with complaints of neck pain along with muscle tightness. Leukocytosis on arrival of 17.8 that is continuing to improve. Lactic acid is 0.4. Cervical MRI showed epidural phlegmonous inflammation from upper C4 to upper C7. Effacement of the subarachnoid space surrounding the cord with flattening of the cord. No evidence of osteomyelitis. Possible posterior discitis at C5-6 which has decompressed into the epidural space, not definitely the origin  -Patient had difficulty gaining IV access due to bilateral wounds in the forearm. Patient bridged with oral Flagyl, Zyvox, Cipro until IV access was established. -IR was consulted for tunneled catheter placement. -Neurosurgery does not recommend any surgical intervention at this time.  -Blood and urine cultures drawn 10/16. Blood culture x3 grew MRSA. ID recommends continuing with IV Vancomycin for at least one week before attempting line holiday -TTE ordered to r/o infective endocarditis. TTE is normal. ID does not recommend TEE due to risk of paralysis given epidural abscess. -Continue to trend CBC -Pain control with stepwise therapy: Tylenol 1000 mg Q4 PRN for mild pain, Oxycodone 15 mg Q4 PRN for severe  pain 1-2 tablets Q4 PRN, Dilaudid 2 mg Q3 PRN for breakthrough pain. Toradol added at 15 mg Q8 PRN which pt states helps.  -Also on methadone 55 mg tid (home dose 165 mg daily) -Lidocaine patch for neck pain -Robaxin 1000 QID for muscle tightness -ID reassessed patient and recommends removing the central line soon with a 72 hour holiday.They recommend continuing Vancomycin- -Blood cultures drawn 10/18 do not show any growth -IR consulted on 10/22 for central line removal per ID recommendations. ID recommends line holiday for 72 hours -We are discussing bridging patient with oral Linezolid while the line is removed as patient does not have any other IV access. Appreciate pharmacy assistance with this -Oral Linezolid 600 BID started for 5 weeks -Pain medications also transitioned to po since patient has no line access   Left Hand Numbness/Weakness Bilateral Lower Extremity Muscle Cramps Patient reports intermitted lower extremity cramps and some decreased sensation of the left lateral hand. Strength is 5/5 in the upper and lower extremities. Patient B12 and Mg WNL -Lauren continue to monitor symptoms. No interventions indicated at this time. -Lauren consult neurosurgery. Neurosurgery saw the patient and noted triceps weakness. Repeat Cervical MRI ordered did not show progression of spinal cord compression and possible C5-C6 ACD. -10/22 Patient reports that her weakness is improving and neurosurgery also observed improvement -10/23 Awaiting neurosurgery clearance for discharge. Neurosurgery cleared patient for discharge and stated they Lauren follow her OP in 2 weeks. Plan discussed with patient.  Opioid Use Disorder Patient has a history of heroin use. Confirmed Methadone dosage of 165 mg from [facility]. Last IV drug use was 11/2022. -Continue Methadone 55 mg TID   Chronic Skin Wounds, Bilateral UE Hx of Anxiety Hx of Depression Has bilateral skin wounds on the forearms that has  required debridement and skin grafting in the past. Patient reports skin picking due to anxiety. -Wound Allen consulted and applying topical treatments and bandages. WC recommends patient continue her OP follow-up with Lauren Allen   Microcytic Anemia Hgb has been downtrending for the past four months. Her baseline has been around 7.5. MCV is 61.6. Suspect this is secondary to IDA and her bacteremia. No signs of bleeding reported.  Iron 13, TIBC 3, and Ferritin 17. -Patient given 1 unit of PRBC after hemoglobin of 6.6 on 10/19. Hgb the next day improved  to 7.4. -Given acute infection involving the spine, recommend iron supplementation outpatient -Lauren transfuse as needed for Hgb <7   Trichomoniasis Positive urine analysis but patient denies any recent sexual activity or prior infection. -Patient is already covered with Flagyl treatment. Lauren continue Metronidazole 500 po BID treatment for 7 days ending 10/22 -10/23 Patient would like gonorrhea,chlamydia, and syphilis testing. Ordered   Thrombocytosis Platelet 489 on admission and continuing to decrease. 394 today. Likely elevated in the setting of infection. Platelet smear shows normal morphology. -Lauren continue to monitor  Tobacco Use Disorder Patient smokes 1/4 ppd for roughly 16 years -Nicotine patch 14 mg   Intermittent Hypoxia while Sleeping Patient does not have a formal diagnosis of OSA. STOP BANG score is 3. She endorses some daytime fatigue, headache, and heavy snoring during sleep. -Recommend sleep studies outpatient -Supplemental O2 at bedtime PRN   Patient Lauren establish her primary Allen [facility] at continue follow up there.  She Lauren also be seeing Lauren Allen for her bilateral wounds and [facility] for her epidural abscess.   Discharge Exam:   BP 121/80 (BP Location: Right Arm)   Pulse 82   Temp 98.3 F (36.8 C) (Oral)   Resp 18   Ht 5\' 3"  (1.6 m)   Wt 104 kg   LMP 01/09/2023 (Exact  Date)   SpO2 98%   BMI 40.61 kg/m  Discharge exam: Physical Exam Constitutional:      Appearance: She is obese.  HENT:     Head: Normocephalic and atraumatic.  Cardiovascular:     Rate and Rhythm: Normal rate and regular rhythm.  Pulmonary:     Effort: Pulmonary effort is normal.     Breath sounds: Normal breath sounds.  Chest:     Comments: Gauze over the central line removal area. No tenderness, erythema, or edema noted around the site. Abdominal:     General: Abdomen is flat. Bowel sounds are normal.  Musculoskeletal:     Right lower leg: No edema.     Left lower leg: No edema.  Skin:    General: Skin is warm.     Comments: Bilateral forearm wounds wrapped in Kerlex. No oozing or discharge noted.  Neurological:     Mental Status: She is alert and oriented to person, place, and time.      Pertinent Labs, Studies, and Procedures:     Latest Ref Rng & Units 02/15/2023    8:43 AM 02/14/2023    4:58 AM 02/13/2023    3:08 AM  CBC  WBC 4.0 - 10.5 K/uL 11.4  11.6  11.1   Hemoglobin 12.0 - 15.0 g/dL 9.1  8.3  7.5   Hematocrit 36.0 - 46.0 % 33.3  30.1  27.3   Platelets 150 - 400 K/uL 473  406  394        Latest Ref Rng & Units 02/13/2023    3:08 AM 02/11/2023    4:59 AM 02/10/2023    9:29 AM  BMP  Glucose 70 - 99 mg/dL 99  161  096   BUN 6 - 20 mg/dL 9  8  10    Creatinine 0.44 - 1.00 mg/dL 0.45  4.09  8.11   Sodium 135 - 145 mmol/L 138  137  137   Potassium 3.5 - 5.1 mmol/L 3.8  3.6  3.8   Chloride 98 - 111 mmol/L 100  102  103   CO2 22 - 32 mmol/L 26  28  27  Calcium 8.9 - 10.3 mg/dL 9.3  8.7  8.6     IR Fluoro Guide CV Line Right  Result Date: 02/09/2023 INDICATION: 32 year old female referred for tunneled central venous catheter EXAM: IMAGE GUIDED TUNNELED CENTRAL VENOUS CATHETER MEDICATIONS: None ANESTHESIA/SEDATION: None FLUOROSCOPY: Radiation Exposure Index (as provided by the fluoroscopic device): 2 mGy Kerma COMPLICATIONS: None PROCEDURE: Informed written  consent was obtained from the patient after a thorough discussion of the procedural risks, benefits and alternatives. All questions were addressed. Maximal Sterile Barrier Technique was utilized including caps, mask, sterile gowns, sterile gloves, sterile drape, hand hygiene and skin antiseptic. A timeout was performed prior to the initiation of the procedure. After written informed consent was obtained, patient was placed in the supine position on angiographic table. Patency of the right internal jugular vein was confirmed with ultrasound with image documentation. Patient was prepped and draped in the usual sterile fashion including the right neck and right superior chest. Using ultrasound guidance, the skin and subcutaneous tissues overlying the right internal jugular vein were generously infiltrated with 1% lidocaine without epinephrine. Using ultrasound guidance, the right internal jugular vein was punctured with a micropuncture needle, and an 018 wire was advanced into the right heart confirming venous access. A small stab incision was made with an 11 blade scalpel. Peel-away sheath was placed over the wire, and then the wire was removed, marking the wire for estimation of internal catheter length. The chest wall was then generously infiltrated with 1% lidocaine for local anesthesia along the tissue tract. Small stab incision was made with 11 blade scalpel, and then the catheter was back tunneled to the puncture site at the right internal jugular vein. Catheter was pulled through the tract, with the catheter amputated at 22 cm. Catheter was advanced through the peel-away sheath, and the peel-away sheath was removed. Final image was stored. The catheter was anchored to the chest wall with 2 retention sutures, and Derma bond was used to seal the right internal jugular vein incision site and at the right chest wall. Patient tolerated the procedure well and remained hemodynamically stable throughout. No  complications were encountered and no significant blood loss was encountered. IMPRESSION: Status post image guided right IJ tunneled central venous catheter. Signed, Yvone Neu. Miachel Roux, RPVI [facility] Radiology Electronically Signed   By: Gilmer Mor D.O.   On: 02/09/2023 08:14   IR US Guide Vasc Access Right  Result Date: 02/09/2023 INDICATION: 32 year old female referred for tunneled central venous catheter EXAM: IMAGE GUIDED TUNNELED CENTRAL VENOUS CATHETER MEDICATIONS: None ANESTHESIA/SEDATION: None FLUOROSCOPY: Radiation Exposure Index (as provided by the fluoroscopic device): 2 mGy Kerma COMPLICATIONS: None PROCEDURE: Informed written consent was obtained from the patient after a thorough discussion of the procedural risks, benefits and alternatives. All questions were addressed. Maximal Sterile Barrier Technique was utilized including caps, mask, sterile gowns, sterile gloves, sterile drape, hand hygiene and skin antiseptic. A timeout was performed prior to the initiation of the procedure. After written informed consent was obtained, patient was placed in the supine position on angiographic table. Patency of the right internal jugular vein was confirmed with ultrasound with image documentation. Patient was prepped and draped in the usual sterile fashion including the right neck and right superior chest. Using ultrasound guidance, the skin and subcutaneous tissues overlying the right internal jugular vein were generously infiltrated with 1% lidocaine without epinephrine. Using ultrasound guidance, the right internal jugular vein was punctured with a micropuncture needle, and an 018  wire was advanced into the right heart confirming venous access. A small stab incision was made with an 11 blade scalpel. Peel-away sheath was placed over the wire, and then the wire was removed, marking the wire for estimation of internal catheter length. The chest  wall was then generously infiltrated with 1% lidocaine for local anesthesia along the tissue tract. Small stab incision was made with 11 blade scalpel, and then the catheter was back tunneled to the puncture site at the right internal jugular vein. Catheter was pulled through the tract, with the catheter amputated at 22 cm. Catheter was advanced through the peel-away sheath, and the peel-away sheath was removed. Final image was stored. The catheter was anchored to the chest wall with 2 retention sutures, and Derma bond was used to seal the right internal jugular vein incision site and at the right chest wall. Patient tolerated the procedure well and remained hemodynamically stable throughout. No complications were encountered and no significant blood loss was encountered. IMPRESSION: Status post image guided right IJ tunneled central venous catheter. Signed, Yvone Neu. Miachel Roux, RPVI [facility] Radiology Electronically Signed   By: Gilmer Mor D.O.   On: 02/09/2023 08:14   ECHOCARDIOGRAM COMPLETE  Result Date: 02/08/2023    ECHOCARDIOGRAM REPORT   Patient Name:   Lauren Allen Date of Exam: 02/08/2023 Medical Rec #:  [id number]        Height:       63.0 in Accession #:    [id number]       Weight:       230.0 lb Date of Birth:  1991/01/09       BSA:          2.052 m Patient Age:    31 years         BP:           111/71 mmHg Patient Gender: F                HR:           85 bpm. Exam Location:  Inpatient Procedure: 2D Echo, Cardiac Doppler and Color Doppler Indications:    Murmur  History:        Patient has no prior history of Echocardiogram examinations.                 Opiate addiction.  Sonographer:    Milda Allen Referring Phys: [id number] GRACE LAU  Sonographer Comments: Image acquisition challenging due to respiratory motion and Image acquisition challenging due to patient body habitus. IMPRESSIONS  1. Left ventricular ejection fraction, by  estimation, is 60 to 65%. The left ventricle has normal function. The left ventricle has no regional wall motion abnormalities. Left ventricular diastolic parameters were normal.  2. Right ventricular systolic function is normal. The right ventricular size is normal.  3. The mitral valve is normal in structure. No evidence of mitral valve regurgitation. No evidence of mitral stenosis.  4. The aortic valve is normal in structure. Aortic valve regurgitation is not visualized. No aortic stenosis is present.  5. The inferior vena cava is normal in size with greater than 50% respiratory variability, suggesting right atrial pressure of 3 mmHg. FINDINGS  Left Ventricle: Left ventricular ejection fraction, by estimation, is 60 to 65%. The left ventricle has normal function. The left ventricle has no regional wall motion abnormalities. The left ventricular internal cavity size was normal in size. There is  no left ventricular hypertrophy. Left  ventricular diastolic parameters were normal. Right Ventricle: The right ventricular size is normal. No increase in right ventricular wall thickness. Right ventricular systolic function is normal. Left Atrium: Left atrial size was normal in size. Right Atrium: Right atrial size was normal in size. Pericardium: There is no evidence of pericardial effusion. Mitral Valve: The mitral valve is normal in structure. No evidence of mitral valve regurgitation. No evidence of mitral valve stenosis. MV peak gradient, 5.1 mmHg. Tricuspid Valve: The tricuspid valve is normal in structure. Tricuspid valve regurgitation is not demonstrated. No evidence of tricuspid stenosis. Aortic Valve: The aortic valve is normal in structure. Aortic valve regurgitation is not visualized. No aortic stenosis is present. Pulmonic Valve: The pulmonic valve was normal in structure. Pulmonic valve regurgitation is not visualized. No evidence of pulmonic stenosis. Aorta: The aortic root is normal in size and structure.  Venous: The inferior vena cava is normal in size with greater than 50% respiratory variability, suggesting right atrial pressure of 3 mmHg. IAS/Shunts: No atrial level shunt detected by color flow Doppler.  LEFT VENTRICLE PLAX 2D LVIDd:         4.70 cm     Diastology LVIDs:         3.00 cm     LV e' medial:    10.20 cm/s LV PW:         0.80 cm     LV E/e' medial:  11.7 LV IVS:        0.90 cm     LV e' lateral:   13.70 cm/s LVOT diam:     2.00 cm     LV E/e' lateral: 8.7 LV SV:         80 LV SV Index:   39 LVOT Area:     3.14 cm  LV Volumes (MOD) LV vol d, MOD A4C: 90.7 ml LV vol s, MOD A4C: 35.0 ml LV SV MOD A4C:     90.7 ml RIGHT VENTRICLE            IVC RV S prime:     9.90 cm/s  IVC diam: 2.00 cm TAPSE (M-mode): 2.5 cm LEFT ATRIUM             Index        RIGHT ATRIUM           Index LA diam:        3.20 cm 1.56 cm/m   RA Area:     10.30 cm LA Vol (A2C):   26.0 ml 12.67 ml/m  RA Volume:   19.80 ml  9.65 ml/m LA Vol (A4C):   21.8 ml 10.62 ml/m LA Biplane Vol: 25.0 ml 12.18 ml/m  AORTIC VALVE LVOT Vmax:   147.00 cm/s LVOT Vmean:  97.400 cm/s LVOT VTI:    0.256 m  AORTA Ao Root diam: 2.90 cm Ao Asc diam:  3.30 cm MITRAL VALVE MV Area (PHT): 3.15 cm     SHUNTS MV Area VTI:   3.59 cm     Systemic VTI:  0.26 m MV Peak grad:  5.1 mmHg     Systemic Diam: 2.00 cm MV Vmax:       1.13 m/s MV Vmean:      94.1 cm/s MV Decel Time: 241 msec MV E velocity: 119.00 cm/s MV A velocity: 81.70 cm/s MV E/A ratio:  1.46 Clearnce Hasten Electronically signed by Clearnce Hasten Signature Date/Time: 02/08/2023/12:33:37 PM    Final    [place] EKG SITE RITE  Result Date: 02/07/2023 If Site Rite image not attached, placement could not be confirmed due to current cardiac rhythm.  MR THORACIC SPINE W WO CONTRAST  Result Date: 02/07/2023 CLINICAL DATA:  Thoracic osteomyelitis EXAM: MRI THORACIC WITHOUT AND WITH CONTRAST TECHNIQUE: Multiplanar and multiecho pulse sequences of the thoracic spine were obtained without and with  intravenous contrast. CONTRAST:  10mL GADAVIST GADOBUTROL 1 MMOL/ML IV SOLN COMPARISON:  None Available. FINDINGS: Alignment:  Slight dextrocurvature. Vertebrae: No fracture, evidence of discitis, or bone lesion. Cord: Normal signal and morphology. No collection in the thoracic spine. Known cervical ventral epidural collection as described on dedicated imaging. Paraspinal and other soft tissues: No perispinal inflammation or hematoma is seen. Disc levels: No significant degenerative change, diffuse disc height and hydration is preserved. IMPRESSION: No evidence of thoracic spine infection or collection. Electronically Signed   By: Tiburcio Pea M.D.   On: 02/07/2023 11:39   MR Cervical Spine W and Wo Contrast  Result Date: 02/07/2023 CLINICAL DATA:  Cervical osteomyelitis.  Severe neck pain. EXAM: MRI CERVICAL SPINE WITHOUT AND WITH CONTRAST TECHNIQUE: Multiplanar and multiecho pulse sequences of the cervical spine, to include the craniocervical junction and cervicothoracic junction, were obtained without and with intravenous contrast. CONTRAST:  10mL GADAVIST GADOBUTROL 1 MMOL/ML IV SOLN COMPARISON:  Cervical CT done yesterday. FINDINGS: Alignment: Straightening of the normal cervical lordosis. Vertebrae: No evidence of fracture. No abnormal bone marrow signal to suggest osteomyelitis. See below regarding epidural infection from C4-C7. Cord: No primary cord lesion. See below regarding stenosis C4 through C7. Posterior Fossa, vertebral arteries, paraspinal tissues: Posterior fossa appears unremarkable. Extra-spinal soft tissues appear normal. Disc levels: There is phlegmonous inflammation within the epidural space from upper C4 to upper C7 consistent with epidural phlegmonous inflammation. The discs at C4-5, C5-6 and C6-7 bulge. There may be enhancement of the posterior periphery of the C5-6 disc in the possibility of posterior discitis at this level which decompressed into the epidural space does exist.  There are not advanced disc space infection findings. Subarachnoid space surrounding the cord is effaced and the cord appears somewhat flattened. Neuro surgical consultation recommended for consideration of decompression. IMPRESSION: Epidural phlegmonous inflammation from upper C4 to upper C7. Effacement of the subarachnoid space surrounding the cord with flattening of the cord. No evidence of osteomyelitis. Possible posterior discitis at C5-6 which has decompressed into the epidural space, not definitely the origin. Neuro surgical consultation recommended for consideration of decompression. Critical Value/emergent results were called by telephone at the time of interpretation on 02/07/2023 at 10:29 am to provider [facility] , who verbally acknowledged these results. Electronically Signed   By: Paulina Fusi M.D.   On: 02/07/2023 10:30    Discharge Instructions: Discharge Instructions     Call MD for:  difficulty breathing, headache or visual disturbances   Complete by: As directed    Call MD for:  extreme fatigue   Complete by: As directed    Call MD for:  hives   Complete by: As directed    Call MD for:  persistant dizziness or light-headedness   Complete by: As directed    Call MD for:  persistant nausea and vomiting   Complete by: As directed    Call MD for:  redness, tenderness, or signs of infection (pain, swelling, redness, odor or green/yellow discharge around incision site)   Complete by: As directed    Call MD for:  severe uncontrolled pain   Complete by: As directed    Call  MD for:  temperature >100.4   Complete by: As directed    Diet - low sodium heart healthy   Complete by: As directed    Discharge instructions   Complete by: As directed    Hello Lauren Allen, you were admitted for an epidural abscess, which is a collection of infection, in your cervical spine. We also found a bacteria in your blood that was likely causing the infection in your cervical spine. We were  treating you with IV antibiotics and switched you to an oral antibiotic, Linezolid.  Please continue to take Linezolid 600 mg twice a day for 5 weeks. We are giving you a two week supply and you Lauren need to follow up in the clinic to get further refills on this. Please do not miss this appointment. Follow up with your PCP and Infectious Disease doctor Dr. Ninetta Lights. We Lauren also give you a short dose of the pain medications to help with your symptoms.  Please schedule the following appointments:  An appointment with Lauren Allen to follow your arm wounds. Your appointment at the Internal Medicine Clinic with Dr. Anastasio Auerbach is on 03/02/2023 at 9:15 am Your appointment at the Internal Medicine Clinic with Dr. Ninetta Lights (Infectious Disease) is on 03/06/2023 at 2:00 pm An appointment with [facility] Neurosurgery to follow your epidural abscess in 1-2 weeks.  Please seek medical attention if you develop any of the following symptoms: worsening numbness or tingling, worsening weakness, coordination issues, balance issues, difficulty walking, or you have changes in your bowel or bladder function. These symptoms may indicate that there is worsening of the infection in your spine that is affecting the surrounding nerves.  It was a pleasure taking Allen of you!  Scherrie November  Internal Medicine   Discharge wound Allen:   Complete by: As directed    Wound Allen  Every other day    Comments: Clean L arm wound with Normal Saline, apply silver hydrofiber Hart [place] [id number]) to wound bed every other day.  Cover with Telfa non stick pad, kerlix and Ace bandage to secure.  IF SILVER IS STUCK TO WOUND BED SOAK WITH Normal Saline FOR ATRAUMATIC REMOVAL.    Wound Allen  Daily      Comments: Clean R arm wound with Vashe wound cleanser Hart [place] [id number]), apply Vashe moistened gauze to entire wound bed daily, cover with dry gauze and Kerlix. Secure with Ace bandage  Call Lauren Allen office if you have questions regarding wound Allen.    Increase activity slowly   Complete by: As directed        Signed: Scherrie November, Medical Student 02/15/2023, 11:36 AM   Pager: [phone]

## 2023-02-15 NOTE — Plan of Care (Signed)

## 2023-02-28 ENCOUNTER — Inpatient Hospital Stay: Payer: Self-pay | Admitting: Family

## 2023-03-02 ENCOUNTER — Encounter: Payer: Self-pay | Admitting: Student

## 2023-03-02 ENCOUNTER — Other Ambulatory Visit: Payer: Self-pay

## 2023-03-02 ENCOUNTER — Other Ambulatory Visit (HOSPITAL_COMMUNITY): Payer: Self-pay

## 2023-03-02 ENCOUNTER — Ambulatory Visit (INDEPENDENT_AMBULATORY_CARE_PROVIDER_SITE_OTHER): Payer: Self-pay | Admitting: Student

## 2023-03-02 ENCOUNTER — Other Ambulatory Visit (HOSPITAL_BASED_OUTPATIENT_CLINIC_OR_DEPARTMENT_OTHER): Payer: Self-pay

## 2023-03-02 VITALS — BP 129/79 | HR 103 | Temp 98.3°F | Ht 63.0 in | Wt 222.2 lb

## 2023-03-02 DIAGNOSIS — S41109A Unspecified open wound of unspecified upper arm, initial encounter: Secondary | ICD-10-CM

## 2023-03-02 DIAGNOSIS — G4734 Idiopathic sleep related nonobstructive alveolar hypoventilation: Secondary | ICD-10-CM

## 2023-03-02 DIAGNOSIS — G062 Extradural and subdural abscess, unspecified: Secondary | ICD-10-CM

## 2023-03-02 DIAGNOSIS — D509 Iron deficiency anemia, unspecified: Secondary | ICD-10-CM

## 2023-03-02 MED ORDER — FERROUS SULFATE 325 (65 FE) MG PO TBEC
325.0000 mg | DELAYED_RELEASE_TABLET | ORAL | 2 refills | Status: DC
  Filled 2023-03-02: qty 60, 120d supply, fill #0

## 2023-03-02 MED ORDER — FERROUS SULFATE 325 (65 FE) MG PO TBEC
325.0000 mg | DELAYED_RELEASE_TABLET | ORAL | 0 refills | Status: AC
  Filled 2023-03-02 (×5): qty 60, 120d supply, fill #0

## 2023-03-02 NOTE — Progress Notes (Unsigned)
CC: Hospital follow-up  HPI: [name] is a 32 y.o. female living with a history stated below and presents today for hospital follow-up. Please see problem based assessment and plan for additional details.  Past Medical History:  Diagnosis Date   Anxiety    Crohn's disease (HCC)    Depression    Headache(784.0)    Ileitis    Opiate addiction (HCC)     Current Outpatient Medications on File Prior to Visit  Medication Sig Dispense Refill   acetaminophen (TYLENOL) 500 MG tablet Take 2 tablets (1,000 mg total) by mouth every 6 (six) hours.     bisacodyl (DULCOLAX) 5 MG EC tablet Take 2 tablets (10 mg total) by mouth daily as needed for moderate constipation. 30 tablet 0   melatonin 5 MG TABS Take 1 tablet (5 mg total) by mouth at bedtime. 30 tablet 0   methadone (DOLOPHINE) 10 MG/ML solution Take 165 mg by mouth daily.     naloxone (NARCAN) 0.4 MG/ML injection Inject 1 mL (0.4 mg total) into the vein as needed for up to 1 dose (If pt is unresponsive or if RR<8). 1 mL 0   naloxone (NARCAN) nasal spray 4 mg/0.1 mL Place 1 spray into the nose as needed, 2 each 0   naproxen (NAPROSYN) 500 MG tablet Take 1 tablet (500 mg total) by mouth 2 (two) times daily. 30 tablet 0   nicotine (NICODERM CQ - DOSED IN MG/24 HOURS) 14 mg/24hr patch Place 1 patch (14 mg total) onto the skin daily for 28 days. 28 patch 0   No current facility-administered medications on file prior to visit.    Family History  Problem Relation Age of Onset   Colon cancer Maternal Grandfather    Celiac disease Maternal Aunt    Diabetes Maternal Grandmother    Hypothyroidism Maternal Grandmother    Heart disease Unknown    Diabetes Mother    Hypertension Mother    Hypothyroidism Mother    Hypertension Father    Heart disease Father    Hypothyroidism Paternal Grandmother     Social History   Socioeconomic History   Marital status: Married    Spouse name: Not on file   Number of children: Not on file    Years of education: Not on file   Highest education level: Not on file  Occupational History   Not on file  Tobacco Use   Smoking status: Some Days    Current packs/day: 0.00    Types: Cigarettes    Last attempt to quit: 12/01/2016    Years since quitting: 6.2   Smokeless tobacco: Never  Vaping Use   Vaping status: Former  Substance and Sexual Activity   Alcohol use: Not Currently   Drug use: Not Currently    Comment: heroin clean 6 yrs, minor relapse in june 2024   Sexual activity: Not Currently    Birth control/protection: None  Other Topics Concern   Not on file  Social History Narrative   Has one coke a day   Social Determinants of Health   Financial Resource Strain: Not on file  Food Insecurity: No Food Insecurity (02/07/2023)   Hunger Vital Sign    Worried About Running Out of Food in the Last Year: Never true    Ran Out of Food in the Last Year: Never true  Transportation Needs: No Transportation Needs (02/07/2023)   PRAPARE - Transportation    Lack of Transportation (Medical): No    Lack of  Transportation (Non-Medical): No  Physical Activity: Not on file  Stress: Not on file  Social Connections: Not on file  Intimate Partner Violence: Patient Declined (02/07/2023)   Humiliation, Afraid, Rape, and Kick questionnaire    Fear of Current or Ex-Partner: Patient declined    Emotionally Abused: Patient declined    Physically Abused: Patient declined    Sexually Abused: Patient declined    Review of Systems: ROS negative except for what is noted on the assessment and plan.  Vitals:   03/02/23 0906  BP: 129/79  Pulse: (!) 103  Temp: 98.3 F (36.8 C)  TempSrc: Oral  SpO2: 100%  Weight: 222 lb 3.2 oz (100.8 kg)  Height: 5\' 3"  (1.6 m)    Physical Exam: Constitutional: well-appearing in no acute distress HENT: normocephalic atraumatic, mucous membranes moist Eyes: conjunctiva non-erythematous Neck: supple Cardiovascular: regular rate and rhythm, no  m/r/g Pulmonary/Chest: normal work of breathing on room air, lungs clear to auscultation bilaterally Abdominal: soft, non-tender, non-distended MSK: Bilateral upper extremity skin wounds covered with bandages Neurological: alert & oriented x 3, 5/5 strength in bilateral upper and lower extremities, normal gait Skin: warm and dry   Assessment & Plan:   Epidural abscess Patient recently hospitalized for epidural abscess that was discharged on 10/24.  Recently finished her dose of linezolid last night.  She is scheduled to have an appoint with Dr. Ninetta Lights on 11/12.  She was supposed to have followed up with neurosurgery with a repeat cervical MRI 2 weeks after discharge, but never did so.  We will facilitate this and get the patient scheduled with Dr. Franky Macho.  Patient denies any fevers, chills, or night sweats.  She feels like her pain is improving and is currently managed with Tylenol. - Follow-up CBC - Follow-up with neurosurgery  Iron deficiency anemia Ferritin on 10/17 low/normal at 17.  Iron low at 13.  Patient notes that she has felt less energy recently which may be in the context of iron deficiency anemia.  Will start oral iron supplementation. - Follow-up CBC - Begin iron supplementation every other day  Arm wound Patient noted to have had bilateral skin wounds on her forearms that required debridement and skin grafting in the past.  She is seen by wound care who recommended outpatient follow-up with Dr. Lajoyce Corners.  Patient notes that she is scheduled to follow-up with them on 11/21. - Follow-up with Dr. Lajoyce Corners   Sleep-related hypoxia Patient has no formal diagnosis OSA but reports daytime fatigue and snoring by her partner.  STOP-BANG score of 3.  As patient is currently uninsured, we will address this when she gains insurance.  Patient seen with Dr. Collier Flowers, MD  [facility] Internal Medicine, PGY-1 Date 03/03/2023 Time 9:41 PM

## 2023-03-02 NOTE — Patient Instructions (Signed)
Thank you so much for coming to the clinic today!   For your iron deficiency, I have sent in a prescription. Please take this every other day.  Please follow up with your neurosurgeon as soon as you can.   We will see you again in one month.   If you have any questions please feel free to the call the clinic at anytime at [phone]. It was a pleasure seeing you!  Best, Dr. Rayvon Char

## 2023-03-03 DIAGNOSIS — D509 Iron deficiency anemia, unspecified: Secondary | ICD-10-CM | POA: Insufficient documentation

## 2023-03-03 DIAGNOSIS — G4734 Idiopathic sleep related nonobstructive alveolar hypoventilation: Secondary | ICD-10-CM | POA: Insufficient documentation

## 2023-03-03 LAB — CBC WITH DIFFERENTIAL/PLATELET
Basophils Absolute: 0.1 10*3/uL (ref 0.0–0.2)
Basos: 1 %
EOS (ABSOLUTE): 0.1 10*3/uL (ref 0.0–0.4)
Eos: 2 %
Hematocrit: 36.3 % (ref 34.0–46.6)
Hemoglobin: 10.1 g/dL — ABNORMAL LOW (ref 11.1–15.9)
Immature Grans (Abs): 0.1 10*3/uL (ref 0.0–0.1)
Immature Granulocytes: 1 %
Lymphocytes Absolute: 2 10*3/uL (ref 0.7–3.1)
Lymphs: 25 %
MCH: 18.7 pg — ABNORMAL LOW (ref 26.6–33.0)
MCHC: 27.8 g/dL — ABNORMAL LOW (ref 31.5–35.7)
MCV: 67 fL — ABNORMAL LOW (ref 79–97)
Monocytes Absolute: 0.8 10*3/uL (ref 0.1–0.9)
Monocytes: 10 %
Neutrophils Absolute: 4.9 10*3/uL (ref 1.4–7.0)
Neutrophils: 61 %
Platelets: 356 10*3/uL (ref 150–450)
RBC: 5.4 x10E6/uL — ABNORMAL HIGH (ref 3.77–5.28)
RDW: 25.7 % — ABNORMAL HIGH (ref 11.7–15.4)
WBC: 8 10*3/uL (ref 3.4–10.8)

## 2023-03-03 NOTE — Assessment & Plan Note (Signed)
Patient recently hospitalized for epidural abscess that was discharged on 10/24.  Recently finished her dose of linezolid last night.  She is scheduled to have an appoint with Dr. Ninetta Lights on 11/12.  She was supposed to have followed up with neurosurgery with a repeat cervical MRI 2 weeks after discharge, but never did so.  We will facilitate this and get the patient scheduled with Dr. Franky Macho.  Patient denies any fevers, chills, or night sweats.  She feels like her pain is improving and is currently managed with Tylenol. - Follow-up CBC - Follow-up with neurosurgery

## 2023-03-03 NOTE — Assessment & Plan Note (Signed)
Ferritin on 10/17 low/normal at 17.  Iron low at 13.  Patient notes that she has felt less energy recently which may be in the context of iron deficiency anemia.  Will start oral iron supplementation. - Follow-up CBC - Begin iron supplementation every other day

## 2023-03-03 NOTE — Assessment & Plan Note (Signed)
Patient noted to have had bilateral skin wounds on her forearms that required debridement and skin grafting in the past.  She is seen by wound care who recommended outpatient follow-up with Dr. Lajoyce Corners.  Patient notes that she is scheduled to follow-up with them on 11/21. - Follow-up with Dr. Lajoyce Corners

## 2023-03-03 NOTE — Assessment & Plan Note (Signed)
Patient has no formal diagnosis OSA but reports daytime fatigue and snoring by her partner.  STOP-BANG score of 3.  As patient is currently uninsured, we will address this when she gains insurance.

## 2023-03-05 ENCOUNTER — Other Ambulatory Visit (HOSPITAL_BASED_OUTPATIENT_CLINIC_OR_DEPARTMENT_OTHER): Payer: Self-pay

## 2023-03-05 NOTE — Progress Notes (Signed)
Sent patient message in MyChart to discuss result. Leukocytosis resolved. Anemia likely secondary to iron deficiency so will continue iron supplementation.

## 2023-03-06 ENCOUNTER — Ambulatory Visit: Payer: Self-pay | Admitting: Infectious Diseases

## 2023-03-08 NOTE — Progress Notes (Signed)
Internal Medicine Clinic Attending  I was physically present during the key portions of the resident provided service and participated in the medical decision making of patient's management care. I reviewed pertinent patient test results.  The assessment, diagnosis, and plan were formulated together and I agree with the documentation in the resident's note.  Gust Rung, DO

## 2023-03-15 ENCOUNTER — Ambulatory Visit: Payer: Self-pay | Admitting: Orthopedic Surgery

## 2023-03-20 ENCOUNTER — Ambulatory Visit (INDEPENDENT_AMBULATORY_CARE_PROVIDER_SITE_OTHER): Payer: Self-pay | Admitting: Infectious Diseases

## 2023-03-20 VITALS — BP 122/73 | HR 116 | Temp 99.3°F | Ht 63.0 in | Wt 218.1 lb

## 2023-03-20 DIAGNOSIS — F1110 Opioid abuse, uncomplicated: Secondary | ICD-10-CM

## 2023-03-20 DIAGNOSIS — F411 Generalized anxiety disorder: Secondary | ICD-10-CM

## 2023-03-20 DIAGNOSIS — G062 Extradural and subdural abscess, unspecified: Secondary | ICD-10-CM

## 2023-03-20 NOTE — Assessment & Plan Note (Signed)
She is doing well She has had f/u with neurosurgery She did not have esr and crp done when in hospital Will repeat her MRI as requsted by neurosurgery.  Will call her with results.

## 2023-03-20 NOTE — Assessment & Plan Note (Signed)
She continues on methadone and is in counseling.

## 2023-03-20 NOTE — Assessment & Plan Note (Signed)
She has been written a rx for citalopram.  Encouraged her to at least try this and remember that there are multiple options available.  She states she has been dx with anxiety/ptsd> depression.  Will get her seen at [facility].
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
## 2023-04-05 ENCOUNTER — Other Ambulatory Visit (HOSPITAL_COMMUNITY): Payer: Self-pay | Admitting: Neurosurgery

## 2023-04-05 ENCOUNTER — Ambulatory Visit: Payer: Self-pay | Admitting: Orthopedic Surgery

## 2023-04-05 DIAGNOSIS — G061 Intraspinal abscess and granuloma: Secondary | ICD-10-CM

## 2023-04-09 ENCOUNTER — Ambulatory Visit: Payer: Self-pay | Admitting: Orthopedic Surgery

## 2023-04-20 ENCOUNTER — Encounter: Payer: Self-pay | Admitting: Infectious Diseases

## 2023-04-23 ENCOUNTER — Other Ambulatory Visit: Payer: Self-pay

## 2023-04-30 ENCOUNTER — Other Ambulatory Visit: Payer: Self-pay

## 2023-04-30 ENCOUNTER — Ambulatory Visit: Payer: Self-pay | Admitting: Orthopedic Surgery

## 2023-05-03 ENCOUNTER — Other Ambulatory Visit (HOSPITAL_COMMUNITY): Payer: Self-pay

## 2023-05-03 ENCOUNTER — Other Ambulatory Visit: Payer: Self-pay | Admitting: Infectious Diseases

## 2023-05-03 MED ORDER — LORAZEPAM 1 MG PO TABS
1.0000 mg | ORAL_TABLET | Freq: Once | ORAL | 0 refills | Status: AC
  Filled 2023-05-03: qty 2, 1d supply, fill #0

## 2023-05-17 ENCOUNTER — Ambulatory Visit: Payer: Self-pay | Admitting: Orthopedic Surgery

## 2023-06-04 ENCOUNTER — Other Ambulatory Visit: Payer: Medicaid Other

## 2023-06-09 ENCOUNTER — Telehealth: Payer: Self-pay | Admitting: Family Medicine

## 2023-06-09 DIAGNOSIS — M549 Dorsalgia, unspecified: Secondary | ICD-10-CM

## 2023-06-09 NOTE — Progress Notes (Signed)
  Because you have a history of a previous infection and the pain is as bad as you describe, I feel your condition warrants further evaluation and I recommend that you be seen in a face-to-face visit.  Unfortunately we do not prescribe medications like oxycodone or dilaudid with e visits or virtual visits.    NOTE: There will be NO CHARGE for this E-Visit   If you are having a true medical emergency, please call 911.     For an urgent face to face visit, [place] has multiple urgent care centers for your convenience.  Click the link below for the full list of locations and hours, walk-in wait times, appointment scheduling options and driving directions:  Urgent Care - [place], [place], [place], [place], [place], [place]  [place]     Your MyChart E-visit questionnaire answers were reviewed by a board certified advanced clinical practitioner to complete your personal care plan based on your specific symptoms.    Thank you for using e-Visits.   I have spent 5 minutes in review of e-visit questionnaire, review and updating patient chart, medical decision making and response to patient.   Reed Pandy, PA-C

## 2023-06-10 ENCOUNTER — Emergency Department (HOSPITAL_COMMUNITY): Payer: Self-pay

## 2023-06-10 ENCOUNTER — Emergency Department (HOSPITAL_COMMUNITY): Payer: MEDICAID

## 2023-06-10 ENCOUNTER — Inpatient Hospital Stay (HOSPITAL_COMMUNITY)
Admission: EM | Admit: 2023-06-10 | Discharge: 2023-06-13 | DRG: 095 | Disposition: A | Discharge status: Home / Self Care | Payer: MEDICAID | Source: Other Acute Inpatient Hospital | Attending: Internal Medicine | Admitting: Internal Medicine

## 2023-06-10 ENCOUNTER — Encounter (HOSPITAL_COMMUNITY): Payer: Self-pay | Admitting: *Deleted

## 2023-06-10 ENCOUNTER — Other Ambulatory Visit: Payer: Self-pay

## 2023-06-10 DIAGNOSIS — N3000 Acute cystitis without hematuria: Secondary | ICD-10-CM | POA: Diagnosis present

## 2023-06-10 DIAGNOSIS — G8929 Other chronic pain: Secondary | ICD-10-CM | POA: Diagnosis present

## 2023-06-10 DIAGNOSIS — F419 Anxiety disorder, unspecified: Secondary | ICD-10-CM | POA: Diagnosis present

## 2023-06-10 DIAGNOSIS — M869 Osteomyelitis, unspecified: Secondary | ICD-10-CM

## 2023-06-10 DIAGNOSIS — G061 Intraspinal abscess and granuloma: Principal | ICD-10-CM | POA: Diagnosis present

## 2023-06-10 DIAGNOSIS — Z8661 Personal history of infections of the central nervous system: Secondary | ICD-10-CM

## 2023-06-10 DIAGNOSIS — M4622 Osteomyelitis of vertebra, cervical region: Secondary | ICD-10-CM | POA: Diagnosis present

## 2023-06-10 DIAGNOSIS — Z800 Family history of malignant neoplasm of digestive organs: Secondary | ICD-10-CM

## 2023-06-10 DIAGNOSIS — A4902 Methicillin resistant Staphylococcus aureus infection, unspecified site: Secondary | ICD-10-CM | POA: Insufficient documentation

## 2023-06-10 DIAGNOSIS — Z87891 Personal history of nicotine dependence: Secondary | ICD-10-CM

## 2023-06-10 DIAGNOSIS — N3090 Cystitis, unspecified without hematuria: Secondary | ICD-10-CM | POA: Insufficient documentation

## 2023-06-10 DIAGNOSIS — Z833 Family history of diabetes mellitus: Secondary | ICD-10-CM

## 2023-06-10 DIAGNOSIS — M40202 Unspecified kyphosis, cervical region: Secondary | ICD-10-CM | POA: Diagnosis present

## 2023-06-10 DIAGNOSIS — Z8379 Family history of other diseases of the digestive system: Secondary | ICD-10-CM

## 2023-06-10 DIAGNOSIS — F79 Unspecified intellectual disabilities: Secondary | ICD-10-CM | POA: Diagnosis present

## 2023-06-10 DIAGNOSIS — Z8249 Family history of ischemic heart disease and other diseases of the circulatory system: Secondary | ICD-10-CM

## 2023-06-10 DIAGNOSIS — D509 Iron deficiency anemia, unspecified: Secondary | ICD-10-CM | POA: Diagnosis present

## 2023-06-10 DIAGNOSIS — F1190 Opioid use, unspecified, uncomplicated: Secondary | ICD-10-CM | POA: Diagnosis present

## 2023-06-10 DIAGNOSIS — M542 Cervicalgia: Principal | ICD-10-CM

## 2023-06-10 DIAGNOSIS — Z8614 Personal history of Methicillin resistant Staphylococcus aureus infection: Secondary | ICD-10-CM

## 2023-06-10 DIAGNOSIS — F32A Depression, unspecified: Secondary | ICD-10-CM | POA: Diagnosis present

## 2023-06-10 DIAGNOSIS — M4642 Discitis, unspecified, cervical region: Secondary | ICD-10-CM | POA: Diagnosis present

## 2023-06-10 DIAGNOSIS — Z882 Allergy status to sulfonamides status: Secondary | ICD-10-CM

## 2023-06-10 DIAGNOSIS — M4807 Spinal stenosis, lumbosacral region: Secondary | ICD-10-CM | POA: Diagnosis present

## 2023-06-10 LAB — COMPREHENSIVE METABOLIC PANEL
ALT: 20 U/L (ref 0–44)
AST: 14 U/L — ABNORMAL LOW (ref 15–41)
Albumin: 3.4 g/dL — ABNORMAL LOW (ref 3.5–5.0)
Alkaline Phosphatase: 129 U/L — ABNORMAL HIGH (ref 38–126)
Anion gap: 13 (ref 5–15)
BUN: 8 mg/dL (ref 6–20)
CO2: 24 mmol/L (ref 22–32)
Calcium: 9.5 mg/dL (ref 8.9–10.3)
Chloride: 97 mmol/L — ABNORMAL LOW (ref 98–111)
Creatinine, Ser: 0.7 mg/dL (ref 0.44–1.00)
GFR, Estimated: >60 mL/min (ref >60)
Glucose, Bld: 100 mg/dL — ABNORMAL HIGH (ref 70–99)
Potassium: 4 mmol/L (ref 3.5–5.1)
Sodium: 134 mmol/L — ABNORMAL LOW (ref 135–145)
Total Bilirubin: 0.6 mg/dL (ref 0.0–1.2)
Total Protein: 8.1 g/dL (ref 6.5–8.1)

## 2023-06-10 LAB — CBC
HCT: 31.9 % — ABNORMAL LOW (ref 36.0–46.0)
Hemoglobin: 8.7 g/dL — ABNORMAL LOW (ref 12.0–15.0)
MCH: 16.8 pg — ABNORMAL LOW (ref 26.0–34.0)
MCHC: 27.3 g/dL — ABNORMAL LOW (ref 30.0–36.0)
MCV: 61.5 fL — ABNORMAL LOW (ref 80.0–100.0)
Platelets: 436 10*3/uL — ABNORMAL HIGH (ref 150–400)
RBC: 5.19 MIL/uL — ABNORMAL HIGH (ref 3.87–5.11)
RDW: 21.9 % — ABNORMAL HIGH (ref 11.5–15.5)
WBC: 16.8 10*3/uL — ABNORMAL HIGH (ref 4.0–10.5)
nRBC: 0 % (ref 0.0–0.2)

## 2023-06-10 LAB — URINALYSIS, ROUTINE W REFLEX MICROSCOPIC
Bilirubin Urine: NEGATIVE
Glucose, UA: NEGATIVE mg/dL
Hgb urine dipstick: NEGATIVE
Ketones, ur: NEGATIVE mg/dL
Nitrite: NEGATIVE
Protein, ur: 30 mg/dL — AB
Specific Gravity, Urine: 1.021 (ref 1.005–1.030)
Squamous Epithelial / HPF: >50 /[HPF] (ref 0–5)
WBC, UA: >50 WBC/hpf (ref 0–5)
pH: 6 (ref 5.0–8.0)

## 2023-06-10 LAB — HCG, SERUM, QUALITATIVE: Preg, Serum: NEGATIVE

## 2023-06-10 LAB — C-REACTIVE PROTEIN: CRP: 9.8 mg/dL — ABNORMAL HIGH (ref <1.0)

## 2023-06-10 LAB — SEDIMENTATION RATE: Sed Rate: 42 mm/h — ABNORMAL HIGH (ref 0–22)

## 2023-06-10 LAB — LACTIC ACID, PLASMA: Lactic Acid, Venous: 1 mmol/L (ref 0.5–1.9)

## 2023-06-10 MED ORDER — RIVAROXABAN 10 MG PO TABS
10.0000 mg | ORAL_TABLET | Freq: Every day | ORAL | Status: DC
  Administered 2023-06-10 – 2023-06-13 (×4): 10 mg via ORAL
  Filled 2023-06-10 (×4): qty 1

## 2023-06-10 MED ORDER — LORAZEPAM 2 MG/ML IJ SOLN
1.0000 mg | Freq: Once | INTRAMUSCULAR | Status: AC
  Administered 2023-06-10: 1 mg via INTRAVENOUS
  Filled 2023-06-10: qty 1

## 2023-06-10 MED ORDER — OXYCODONE HCL 5 MG PO TABS
5.0000 mg | ORAL_TABLET | Freq: Four times a day (QID) | ORAL | Status: DC | PRN
  Administered 2023-06-10 – 2023-06-13 (×9): 5 mg via ORAL
  Filled 2023-06-10 (×9): qty 1

## 2023-06-10 MED ORDER — MELATONIN 5 MG PO TABS
5.0000 mg | ORAL_TABLET | Freq: Every day | ORAL | Status: DC
  Administered 2023-06-11 – 2023-06-12 (×2): 5 mg via ORAL
  Filled 2023-06-10 (×2): qty 1

## 2023-06-10 MED ORDER — VANCOMYCIN HCL 1750 MG/350ML IV SOLN
1750.0000 mg | INTRAVENOUS | Status: DC
Start: 2023-06-11 — End: 2023-06-11

## 2023-06-10 MED ORDER — MIDAZOLAM HCL 2 MG/2ML IJ SOLN
2.0000 mg | Freq: Once | INTRAMUSCULAR | Status: AC
  Administered 2023-06-10: 2 mg via INTRAVENOUS
  Filled 2023-06-10: qty 2

## 2023-06-10 MED ORDER — MORPHINE SULFATE (PF) 4 MG/ML IV SOLN
4.0000 mg | Freq: Once | INTRAVENOUS | Status: AC
  Administered 2023-06-10: 4 mg via INTRAVENOUS
  Filled 2023-06-10: qty 1

## 2023-06-10 MED ORDER — VANCOMYCIN HCL 2000 MG/400ML IV SOLN
2000.0000 mg | Freq: Once | INTRAVENOUS | Status: AC
  Administered 2023-06-10: 2000 mg via INTRAVENOUS
  Filled 2023-06-10: qty 400

## 2023-06-10 MED ORDER — KETOROLAC TROMETHAMINE 15 MG/ML IJ SOLN
15.0000 mg | Freq: Three times a day (TID) | INTRAMUSCULAR | Status: DC | PRN
  Administered 2023-06-11 – 2023-06-13 (×7): 15 mg via INTRAVENOUS
  Filled 2023-06-10 (×7): qty 1

## 2023-06-10 MED ORDER — ACETAMINOPHEN 325 MG PO TABS: 650.0000 mg | ORAL_TABLET | ORAL | Status: DC | PRN

## 2023-06-10 MED ORDER — SODIUM CHLORIDE 0.9 % IV SOLN
1.0000 g | Freq: Once | INTRAVENOUS | Status: AC
  Administered 2023-06-10: 1 g via INTRAVENOUS
  Filled 2023-06-10: qty 10

## 2023-06-10 MED ORDER — GADOBUTROL 1 MMOL/ML IV SOLN
10.0000 mL | Freq: Once | INTRAVENOUS | Status: AC | PRN
  Administered 2023-06-10: 10 mL via INTRAVENOUS

## 2023-06-10 MED ORDER — METHADONE HCL 10 MG/ML PO CONC: 165.0000 mg | Freq: Every day | ORAL | Status: DC

## 2023-06-10 MED ORDER — SODIUM CHLORIDE 0.9 % IV SOLN
2.0000 g | Freq: Three times a day (TID) | INTRAVENOUS | Status: DC
  Administered 2023-06-10 – 2023-06-11 (×2): 2 g via INTRAVENOUS
  Filled 2023-06-10 (×2): qty 12.5

## 2023-06-10 NOTE — Progress Notes (Signed)
 Pharmacy Antibiotic Note  Lauren Allen is a 33 y.o. female for which pharmacy has been consulted for cefepime and vancomycin dosing for  epidural abscess from C3-C7, with osteomyelitis/discitis at C4-5 and C5-6 . 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  October admission for osteomyelitis/discitis due to MRSA bacteremia discharged on linezolid at that time.  SCr 0.7 WBC 16.8; LA 1; T 98; HR 65; RR 16  Plan: Cefepime 2g q8hr  Vancomycin 2000 mg once then 1750 mg q24hr (eAUC 484.3) unless change in renal function Monitor WBC, fever, renal function, cultures De-escalate when able Levels at steady state  Height: 5\' 3"  (160 cm) Weight: 98.9 kg (218 lb 0.6 oz) IBW/kg (Calculated) : 52.4  Temp (24hrs), Avg:98.5 F (36.9 C), Min:98 F (36.7 C), Max:99.3 F (37.4 C)  Recent Labs  Lab 06/10/23 0059 06/10/23 0935  WBC 16.8*  --   CREATININE 0.70  --   LATICACIDVEN  --  1.0    Estimated Creatinine Clearance: 112.1 mL/min (by C-G formula based on SCr of 0.7 mg/dL).    Allergies  Allergen Reactions   Sulfa Antibiotics Palpitations   Microbiology results: Pending  Thank you for allowing pharmacy to be a part of this patient's care.  Delmar Landau, PharmD, BCPS 06/10/2023 8:29 PM ED Clinical Pharmacist -  [phone]

## 2023-06-10 NOTE — ED Notes (Signed)
 Pt states she is willing to try MRI again w/versed. MRI notified.

## 2023-06-10 NOTE — ED Notes (Signed)
 Pt back to Room 26 with this RN. Pt received 1L NS in MRI r/t hypotension.

## 2023-06-10 NOTE — ED Notes (Signed)
 IV team at bedside

## 2023-06-10 NOTE — ED Notes (Signed)
 MRI called and stated patient did not want MRI and needed more meds. This nurse asked PA [facility] for more ativan and ativan ordered, this nurse advised MRI that She will be in route with medication. MRI stated patient will have to wait until the morning shift for MRI.

## 2023-06-10 NOTE — ED Provider Notes (Signed)
 Physical Exam  BP 107/63 (BP Location: Right Arm)   Pulse 79   Temp 98.6 F (37 C) (Oral)   Resp 18   Ht 5\' 3"  (1.6 m)   Wt 98.9 kg   LMP 05/27/2023   SpO2 99%   BMI 38.62 kg/m   Physical Exam Vitals and nursing note reviewed.  Constitutional:      General: She is not in acute distress.    Appearance: Normal appearance. She is not ill-appearing.  HENT:     Head: Normocephalic and atraumatic.     Mouth/Throat:     Mouth: Mucous membranes are moist.     Pharynx: Oropharynx is clear.  Eyes:     General: No scleral icterus.       Right eye: No discharge.        Left eye: No discharge.     Extraocular Movements: Extraocular movements intact.     Conjunctiva/sclera: Conjunctivae normal.  Cardiovascular:     Rate and Rhythm: Normal rate.  Pulmonary:     Effort: Pulmonary effort is normal. No respiratory distress.  Abdominal:     General: Abdomen is flat. There is no distension.     Palpations: Abdomen is soft.     Tenderness: There is no abdominal tenderness.  Musculoskeletal:        General: Signs of injury (well healing wounds from skin picking on forearms bilaterally.) present.  Skin:    General: Skin is warm and dry.     Coloration: Skin is not jaundiced or pale.     Findings: No erythema.  Neurological:     General: No focal deficit present.     Mental Status: She is alert and oriented to person, place, and time. Mental status is at baseline.     Sensory: No sensory deficit.     Motor: No weakness.     Coordination: Coordination normal.  Psychiatric:        Mood and Affect: Mood normal.     Procedures  Procedures  ED Course / MDM   Clinical Course as of 06/10/23 0952  Sun Jun 10, 2023  0625 Hx of cervical epidural abscess last year. Needed f/u MRI but has not had it, scheduled for MRI already. 3 days of neck pain. No focal neuro deficits. Ordered 1mg  of ativan for MRI, but became anxious. Provided morphine.  Ask neurosurgery before abx. Hx of arm  scratches.  [CB]    Clinical Course User Index [CB] Lunette Stands, PA-C   Medical Decision Making Amount and/or Complexity of Data Reviewed Labs: ordered. Radiology: ordered.  Risk Prescription drug management.   Patient care transferred from [facility], PA-C.  Patient is a 33 year old female with a past history of cervical epidural abscess, with history of heroin abuse.  Has had a 3-day history of cervical neck pain that has been increasing intensity.  Has not used any drugs since being diagnosed epidural abscess.  Scheduled for a MRI tomorrow due to having missed her previous 2-week follow-up after being discharged from the hospital in October 2024. Also endorses a strong urine odor concerned with UTI.  Pending MRI at this time.  UTI present.  Plan is to provide Ativan and consult neurosurgery if epidural abscess present.  On evaluation, patient is calm resting well but has a soft blood pressure and with underlying UTI, Rocephin was provided for patient.  Patient when undergoing MRI refused due to claustrophobia despite 1 mg Ativan.  Spoke with patient again and patient  was agreeable to undergo MRI when given 2 mg of Versed.  Reevaluation, patient's blood pressure is still slightly decreased with a SBP of 107 but normal heart rate at this time.  No acute distress.  Patient still awaiting MRI at this time.  3:31 PM Care of Whitnie Deleon transferred to [facility] at the end of my shift as the patient will require reassessment once labs/imaging have resulted. Patient presentation, ED course, and plan of care discussed with review of all pertinent labs and imaging. Please see his/her note for further details regarding further ED course and disposition. Plan at time of handoff is MRI, if negative treat for UTI, if positive consult neurosurgery and evaluate patient for epidural abscess. This may be altered or completely changed at the discretion of the oncoming team pending results of  further workup.        Lunette Stands, [place] 06/10/23 1532    Eber Hong, MD 06/12/23 [phone]

## 2023-06-10 NOTE — H&P (Cosign Needed Addendum)
 Date: 06/10/2023               Patient Name:  Lauren Allen MRN: [id number]  DOB: March 27, 1991 Age / Sex: 33 y.o., female   PCP: Morrie Sheldon, MD         Medical Service: Internal Medicine Teaching Service         Attending Physician: Dr. Mercie Eon, MD      First Contact: Dr. Morrie Sheldon, MD Pager [phone]    Second Contact: Dr. 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  Patient was recently hospitalized in October 2024 and she was in the hospital for about 6 days with epidural abscess of C4-C7 secondary to MRSA bacteremia.  Patient had TTE at that time and was treated with IV vancomycin. Patient was ultimately transition to linezolid.  She was to complete a 5-week course.  Patient did follow-up with ID as well as the internal medicine clinic.  However, per charting, patient antibiotic course was finished on March 02, 2023 which would only have given her 2 weeks of antibiotic treatment and not 5.  Unclear what happened.  She states she did feel better and did not have any concerns at her follow-up.  She states 2 days ago she developed this neck pain.  She describes it as a sharp, stabbing, burning, throbbing type pain.  Movement makes the pain worse.  She denies any weakness in her arms, but does report having an achy sensation on the back of her arms.  She denies any fevers or chills.  She denies any shortness of breath, diarrhea, nausea, vomiting, lightheadedness, or dizziness.  She does endorse having some urinary symptoms with urinary frequency.  With the pain progressing, she did  make a urgent care telehealth appointment, which they stated she needs to be seen by the emergency department or by a face-to-face appointment.  Patient then presented to the emergency department.  On my exam, patient reports she is still having neck pain.  She does not have any other concerns.  Upon further questioning, patient does get methadone from [facility], and she goes every Friday. She last took her methadone at 8am today. She has not had any recent IV drug use.  ED Course: Patient initially presented to the emergency department with vital signs showing blood pressure 125/71, respirations 18, afebrile, satting 100% on room air. Labs revealed leukocytosis at 16.8.  Patient did have UA showing leukocytes and bacteria.  Will concern for UTI, patient was started on ceftriaxone.  Patient had MRI revealing worsening of her discitis-osteomyelitis.  Neurosurgery was consulted who recommended admission.  [facility] was consulted to admit.  Past Medical History Past Medical History:  Diagnosis Date   Anxiety    Crohn's disease (HCC)    Depression    Headache(784.0)    Ileitis    Opiate addiction (HCC)      Meds:  Tylenol 1000 mg every 6 hours Ferrous sulfate 325 mg every other day Melatonin 5 mg nightly Methadone 165 mg daily Naloxone Naproxen 500 mg twice daily   Past Surgical History  Past Surgical History:  Procedure Laterality Date   COLONOSCOPY     HAND SURGERY Left    I & D EXTREMITY Left 11/06/2016   Procedure: IRRIGATION AND DEBRIDEMENT LEFT HAND;  Surgeon: Bradly Bienenstock, MD;  Location: [facility] OR;  Service: Orthopedics;  Laterality: Left;   I & D EXTREMITY Left 11/08/2016   Procedure: IRRIGATION AND DEBRIDEMENT LEFT HAND;  Surgeon: Bradly Bienenstock, MD;  Location[facility] OR;  Service: Orthopedics;  Laterality: Left;   I & D EXTREMITY Left 10/11/2022   Procedure: IRRIGATION AND DEBRIDEMENT OF ARM;  Surgeon: Nadara Mustard, MD;  Location: [facility] OR;  Service: Orthopedics;  Laterality: Left;   I &  D EXTREMITY Left 10/13/2022   Procedure: DEBRIDEMENT LEFT FOREARM;  Surgeon: Nadara Mustard, MD;  Location: [facility] OR;  Service: Orthopedics;  Laterality: Left;   IR FLUORO GUIDE CV LINE RIGHT  02/08/2023   IR REMOVAL TUN CV CATH W/O FL  02/13/2023   IR US GUIDE VASC ACCESS RIGHT  02/08/2023   TONSILLECTOMY      Social:  Lives With: Son and husband at home Occupation: Musician  Support: Good support in her family  Level of Function: Independent in ADLs and iADLs  PCP: Dr. Morrie Sheldon  Substances: Cigarettes, history of IVDU, no longer using   Family History:  No family history reported   Allergies: Allergies as of 06/10/2023 - Review Complete 06/10/2023  Allergen Reaction Noted   Sulfa antibiotics Palpitations 09/14/2016    Review of Systems: A complete ROS was negative except as per HPI.   OBJECTIVE:   Physical Exam: Blood pressure 110/65, pulse 65, temperature 98 F (36.7 C), temperature source Oral, resp. rate 16, height 5\' 3"  (1.6 m), weight 98.9 kg, last menstrual period 05/27/2023, SpO2 97%.  Constitutional: well-appearing woman laying in bed, in no acute distress HENT: normocephalic atraumatic, mucous membranes moist Eyes: conjunctiva non-erythematous Cardiovascular: regular rate and rhythm, no m/r/g Pulmonary/Chest: normal work of breathing on room air, lungs clear to auscultation bilaterally Abdominal: soft, non-tender, non-distended MSK: normal bulk and tone. Midline tenderness to palpation at cervical spine. Neurological: alert & oriented x 3, 5/5 strength in bilateral upper and lower extremities, no sensory deficits, cranial nerves intact GU: no suprapubic tenderness Skin: warm and dry. No skin changes at neck. Well healing superficial skin lesions of the bilateral forearms are present from previous self injury. The L arm is wrapped.  Labs: CBC    Component Value Date/Time   WBC 16.8 (H) 06/10/2023 0059   RBC 5.19 (H) 06/10/2023 0059   HGB 8.7 (L)  06/10/2023 0059   HGB 10.1 (L) 03/02/2023 1024   HCT 31.9 (L) 06/10/2023 0059   HCT 36.3 03/02/2023 1024   PLT 436 (H) 06/10/2023 0059   PLT 356 03/02/2023 1024   MCV 61.5 (L) 06/10/2023 0059   MCV 67 (L) 03/02/2023 1024   MCH 16.8 (L) 06/10/2023 0059   MCHC 27.3 (L) 06/10/2023 0059   RDW 21.9 (H) 06/10/2023 0059   RDW 25.7 (H) 03/02/2023 1024   LYMPHSABS 2.0 03/02/2023 1024   MONOABS 0.3 02/15/2023 0843   EOSABS 0.1 03/02/2023 1024   BASOSABS 0.1 03/02/2023 1024     CMP     Component Value Date/Time   NA 134 (L) 06/10/2023 0059   K 4.0 06/10/2023 0059   CL 97 (L) 06/10/2023 0059   CO2 24 06/10/2023 0059   GLUCOSE 100 (H) 06/10/2023 0059   BUN 8 06/10/2023 0059   CREATININE 0.70  06/10/2023 0059   CALCIUM 9.5 06/10/2023 0059   PROT 8.1 06/10/2023 0059   ALBUMIN 3.4 (L) 06/10/2023 0059   AST 14 (L) 06/10/2023 0059   ALT 20 06/10/2023 0059   ALKPHOS 129 (H) 06/10/2023 0059   BILITOT 0.6 06/10/2023 0059   GFRNONAA >60 06/10/2023 0059   GFRAA >60 08/01/2017 0831    Imaging:  MRI cervical spine: 1. Findings of ongoing/worsening discitis-osteomyelitis of C5-C6 with destructive endplate changes and partial collapse of the C6 superior endplate contributing to new focal kyphosis at this level. 2. New findings of discitis-osteomyelitis at C4-C5. 3. Circumferential epidural abscess spanning between the C3 and C7 levels with effacement of the thecal sac. Mild cord compression spanning between the C4-C5 and C6-C7 levels. No appreciable cord edema. Neurosurgical consultation is recommended. 4. Marked progression of prevertebral inflammatory changes with a large amount of enhancing phlegmonous material in the prevertebral, retropharyngeal, and pharyngeal spaces.  MRI thoracic spine: 1. No acute abnormality of the thoracic spine. No evidence of thoracic discitis-osteomyelitis or epidural abscess. 2. Trace bilateral pleural effusions.  MRI lumbar spine: 1. No acute  abnormality of the lumbar spine. No evidence of lumbar discitis-osteomyelitis or epidural abscess. 2. Mild left foraminal stenosis at L5-S1.  EKG: personally reviewed my interpretation is normal sinus rhythm with a rate of 60.  When compared to previous EKG, no acute changes.  ASSESSMENT & PLAN:   Assessment & Plan by Problem: Principal Problem:   Discitis of cervical region Active Problems:   IDA (iron deficiency anemia)   Opioid use disorder   Cystitis   Lauren Allen is a 33 y.o. female with a past medical history of anxiety, history of epidural abscess of cervical spine who presents to the emergency department with concerns of neck pain.  MRI imaging showed ongoing/worsening of discitis/osteomyelitis with epidural abscess and patient admitted for further evaluation management.  #C3-C7 epidural abscess #Discitis of C4/C5, C5/C6 Patient presents to the emergency department today with concerns of 2-day history of neck pain.  Patient has had previous hospitalization for discitis/osteomyelitis with epidural abscess of the cervical region previously on October 2024.  Patient was discharged with linezolid.  She was to complete a course for 5 weeks of linezolid, but antibiotics were discontinued on 11/08, only giving 2-week of antibiotics.  Patient did state that she was feeling fine, now developed this neck pain.  Patient fortunately looks stable on my exam.  Patient does have MRI findings consistent with worsening of her discitis and osteomyeliti as well as epidural abscess between C3-C7.  Will admit for IV antibiotics. -Follow-up neurosurgery recommendations -Start IV vancomycin day 1 -Start IV cefepime day 1 -Follow-up blood cultures -Pain control with oxycodone every 6 hours as needed -Will consult infectious disease in the morning  #Simple cystitis Patient found to have urinary tract infection in the emergency department with urinary frequency as well as leukocytes.  Given  broad-spectrum antibiotic coverage as above, will also treat for UTI. -Cefepime day 1 per above  #OUD Patient has a history of opioid use disorder.  No longer using IV substances.  Patient is currently on methadone 165 mg daily.  She goes to [facility] every Friday.  Her last dose of methadone was at 8 AM. -Start methadone 165 mg tomorrow morning at 8 AM  #Iron deficiency anemia Patient has a past medical history of iron deficiency anemia, and is currently on iron supplementation.  Patient had iron studies back in October 2024 showing ferritin of 17, serum iron level of  13, and TIBC of 396.  Hemoglobin today is 8.7.  No acute concern for bleed at this time.  Given active infection, will hold iron supplementation at this time -Hold iron supplementation  Diet: Normal VTE: DOAC IVF: None,None Code: Full  Prior to Admission Living Arrangement:  Home, with family Anticipated Discharge Location: Home Barriers to Discharge: Clinical improvement, definitive antibiotic plan  Dispo: Admit patient to Inpatient with expected length of stay greater than 2 midnights.  Signed: Katheran James, DO Internal Medicine Resident PGY-1 Pager: [phone] 06/10/2023, 10:23 PM   On weekends or after 5pm please page on call intern or resident: First contact: [phone] If no answer in 15 minutes, please contact senior pager at [phone]

## 2023-06-10 NOTE — Progress Notes (Signed)
   Providing Compassionate, Quality Care - Together   Patient is a 33 year old female with a history of osteomyelitis/discitis due to MRSA bacteremia. She was hospitalized in October of 2024 for the infection. Patient was treated with antibiotics and discharged home on oral linezolid for 5 weeks. She followed up in the office in November, where a new MRI was ordered. The patient never got the MRI based on [facility] records nor did she follow up with Dr. Franky Macho following her initial post-hospital visit.  By report, she presented to the ED with a three day history of worsening neck pain. She has no focal neurological deficits. Imaging performed in the ED shows epidural abscess from C3-C7, with osteomyelitis/discitis at C4-5 and C5-6. There is no cord edema noted.  Recommend obtaining CRP and ESR to determine if patient still has active infection. The patient does not need surgery emergently. Neurosurgery will continue to follow.    Val Eagle, DNP, AGNP-C Nurse Practitioner 06/10/2023, 6:55 PM  [facility] [address], [place], [place] [address] P: [phone]    F: [phone]

## 2023-06-10 NOTE — ED Provider Notes (Signed)
 Received from previous provider, please see his note for complete H&P.  33 year old female history of IV drug use and history of epidural abscess in the past presenting with complaint of neck pain.  She endorsed ongoing pain to the back of her neck for the past 2 days.  She endorsed some radiating pain to her shoulder bilaterally without any focal numbness or weakness denies any fever or chills no nausea vomiting no recent injury.  States she had epidural abscess in November of last year that was managed medically in the hospital.  She has not used IV drugs since.  She also has some urinary discomfort.   -Labs ordered, independently viewed and interpreted by me.  Labs remarkable for cloudy urine with many bacteria and moderate leukocyte esterase consistent with UTI.  Rocephin given.  Elevated white count of 16.8.  Hemoglobin is low at 8.7 it is not far off from her baseline. -The patient was maintained on a cardiac monitor.  I personally viewed and interpreted the cardiac monitored which showed an underlying rhythm of: NSR -Imaging independently viewed and interpreted by me and I agree with radiologist's interpretation.  Result remarkable for MRI of her cervical spine thoracic spine and lumbar spine was obtained independently viewed and interpreted by me which demonstrates ongoing worsening discitis and osteomyelitis involving the level of C5-C6, C4-C5 and she also has evidence of an epidural abscess between C3 and C7 with mild cord compression but no appreciable cord edema.  Patient also has marked prevertebral inflammatory changes with a large amount of enhancing phlegmonous material in the prevertebral, retropharyngeal, and pharyngeal space -This patient presents to the ED for concern of neck pain, this involves an extensive number of treatment options, and is a complaint that carries with it a high risk of complications and morbidity.  The differential diagnosis includes epidural abscess, osteomyelitis,  discitis, strain, sprain, cellulitis, fracture, dislocation -Co morbidities that complicate the patient evaluation includes IV drug use, prior epidural abscess of the cervical spine -Treatment includes Rocephin, morphine -Reevaluation of the patient after these medicines showed that the patient improved -PCP office notes or outside notes reviewed -Discussion with specialist neurosurgery team Dr. Jordan Likes who recommend obtain sed rate and CRP.  Hold off on abx at the moment.  Appreciate consultation from Internal Medicine team who agrees to see and will admit pt -Escalation to admission/observation considered: patient is agreeable with admission  .Critical Care  Performed by: Fayrene Helper, PA-C Authorized by: Fayrene Helper, PA-C   Critical care provider statement:    Critical care time (minutes):  45   Critical care was time spent personally by me on the following activities:  Development of treatment plan with patient or surrogate, discussions with consultants, evaluation of patient's response to treatment, examination of patient, ordering and review of laboratory studies, ordering and review of radiographic studies, ordering and performing treatments and interventions, pulse oximetry, re-evaluation of patient's condition and review of old charts  BP 110/65 (BP Location: Left Arm)   Pulse 65   Temp 98 F (36.7 C) (Oral)   Resp 16   Ht 5\' 3"  (1.6 m)   Wt 98.9 kg   LMP 05/27/2023   SpO2 97%   BMI 38.62 kg/m   Results for orders placed or performed during the hospital encounter of 06/10/23  Comprehensive metabolic panel   Collection Time: 06/10/23 12:59 AM  Result Value Ref Range   Sodium 134 (L) 135 - 145 mmol/L   Potassium 4.0 3.5 - 5.1 mmol/L  Chloride 97 (L) 98 - 111 mmol/L   CO2 24 22 - 32 mmol/L   Glucose, Bld 100 (H) 70 - 99 mg/dL   BUN 8 6 - 20 mg/dL   Creatinine, Ser 1.60 0.44 - 1.00 mg/dL   Calcium 9.5 8.9 - 10.9 mg/dL   Total Protein 8.1 6.5 - 8.1 g/dL   Albumin 3.4 (L) 3.5  - 5.0 g/dL   AST 14 (L) 15 - 41 U/L   ALT 20 0 - 44 U/L   Alkaline Phosphatase 129 (H) 38 - 126 U/L   Total Bilirubin 0.6 0.0 - 1.2 mg/dL   GFR, Estimated >32 >35 mL/min   Anion gap 13 5 - 15  CBC   Collection Time: 06/10/23 12:59 AM  Result Value Ref Range   WBC 16.8 (H) 4.0 - 10.5 K/uL   RBC 5.19 (H) 3.87 - 5.11 MIL/uL   Hemoglobin 8.7 (L) 12.0 - 15.0 g/dL   HCT 57.3 (L) 22.0 - 25.4 %   MCV 61.5 (L) 80.0 - 100.0 fL   MCH 16.8 (L) 26.0 - 34.0 pg   MCHC 27.3 (L) 30.0 - 36.0 g/dL   RDW 27.0 (H) 62.3 - 76.2 %   Platelets 436 (H) 150 - 400 K/uL   nRBC 0.0 0.0 - 0.2 %  hCG, serum, qualitative   Collection Time: 06/10/23 12:59 AM  Result Value Ref Range   Preg, Serum NEGATIVE NEGATIVE  Urinalysis, Routine w reflex microscopic -Urine, Clean Catch   Collection Time: 06/10/23  1:00 AM  Result Value Ref Range   Color, Urine AMBER (A) YELLOW   APPearance CLOUDY (A) CLEAR   Specific Gravity, Urine 1.021 1.005 - 1.030   pH 6.0 5.0 - 8.0   Glucose, UA NEGATIVE NEGATIVE mg/dL   Hgb urine dipstick NEGATIVE NEGATIVE   Bilirubin Urine NEGATIVE NEGATIVE   Ketones, ur NEGATIVE NEGATIVE mg/dL   Protein, ur 30 (A) NEGATIVE mg/dL   Nitrite NEGATIVE NEGATIVE   Leukocytes,Ua MODERATE (A) NEGATIVE   RBC / HPF 11-20 0 - 5 RBC/hpf   WBC, UA >50 0 - 5 WBC/hpf   Bacteria, UA MANY (A) NONE SEEN   Squamous Epithelial / HPF >50 0 - 5 /HPF   Mucus PRESENT   Lactic acid, plasma   Collection Time: 06/10/23  9:35 AM  Result Value Ref Range   Lactic Acid, Venous 1.0 0.5 - 1.9 mmol/L   MR Lumbar Spine W Wo Contrast Result Date: 06/10/2023 CLINICAL DATA:  Back pain.  History of spine infection EXAM: MRI LUMBAR SPINE WITHOUT AND WITH CONTRAST TECHNIQUE: Multiplanar and multiecho pulse sequences of the lumbar spine were obtained without and with intravenous contrast. CONTRAST:  10mL GADAVIST GADOBUTROL 1 MMOL/ML IV SOLN COMPARISON:  None Available. FINDINGS: Segmentation:  Standard. Alignment:  Physiologic.  Vertebrae:  No fracture, evidence of discitis, or bone lesion. Conus medullaris and cauda equina: Conus extends to the L1 level. Conus and cauda equina appear normal. No fluid collections within the epidural space of the lumbar spine. Paraspinal and other soft tissues: No paraspinal inflammatory changes. Disc levels: Mild disc desiccation at L5-S1 with mild left foraminal disc bulge contributing to mild left foraminal stenosis. No canal stenosis. Remaining lumbar intervertebral disc levels are otherwise normal without foraminal or canal stenosis. IMPRESSION: 1. No acute abnormality of the lumbar spine. No evidence of lumbar discitis-osteomyelitis or epidural abscess. 2. Mild left foraminal stenosis at L5-S1. Electronically Signed   By: Duanne Guess D.O.   On: 06/10/2023 18:13  MR Cervical Spine W and Wo Contrast Result Date: 06/10/2023 CLINICAL DATA:  Cervical discitis-osteomyelitis EXAM: MRI CERVICAL SPINE WITHOUT AND WITH CONTRAST TECHNIQUE: Multiplanar and multiecho pulse sequences of the cervical spine, to include the craniocervical junction and cervicothoracic junction, were obtained without and with intravenous contrast. CONTRAST:  10mL GADAVIST GADOBUTROL 1 MMOL/ML IV SOLN COMPARISON:  02/07/2023, 02/12/2023 FINDINGS: Alignment: New focal kyphosis of the cervical cord centered at the C5-C6 level. Vertebrae: Findings of ongoing discitis-osteomyelitis of C5-C6 with destructive endplate changes and partial collapse of the C6 superior endplate contributing to new focal kyphosis at this level. Marrow edema and confluent low T1 signal changes within both vertebral bodies. Small amount of enhancing fluid is now present within the C4-C5 disc with mild marrow edema throughout the C4 vertebral body. Small bilateral facet joint effusions at C5 and C6 as well as trace bilateral C6-7 facet effusions. Cord: Circumferential epidural abscess centered at C5-6 but spanning between the C3 and C7 levels with effacement  of the thecal sac. Mild cord compression spanning between the C4-C5 and C6-C7 levels. No appreciable cord edema. Posterior Fossa, vertebral arteries, paraspinal tissues: Marked progression of prevertebral inflammatory changes with a large amount of enhancing phlegmonous material in the prevertebral, retropharyngeal, and pharyngeal spaces. Disc levels: Collapse of the C5-C6 disc space. Elsewhere, no significant degenerative findings. IMPRESSION: 1. Findings of ongoing/worsening discitis-osteomyelitis of C5-C6 with destructive endplate changes and partial collapse of the C6 superior endplate contributing to new focal kyphosis at this level. 2. New findings of discitis-osteomyelitis at C4-C5. 3. Circumferential epidural abscess spanning between the C3 and C7 levels with effacement of the thecal sac. Mild cord compression spanning between the C4-C5 and C6-C7 levels. No appreciable cord edema. Neurosurgical consultation is recommended. 4. Marked progression of prevertebral inflammatory changes with a large amount of enhancing phlegmonous material in the prevertebral, retropharyngeal, and pharyngeal spaces. Electronically Signed   By: Duanne Guess D.O.   On: 06/10/2023 18:11   MR THORACIC SPINE W WO CONTRAST Result Date: 06/10/2023 CLINICAL DATA:  Mid back pain.  History of epidural abscess EXAM: MRI THORACIC WITHOUT AND WITH CONTRAST TECHNIQUE: Multiplanar and multiecho pulse sequences of the thoracic spine were obtained without and with intravenous contrast. CONTRAST:  10mL GADAVIST GADOBUTROL 1 MMOL/ML IV SOLN COMPARISON:  MRI 02/07/2023 FINDINGS: Alignment:  Physiologic. Vertebrae: No fracture, evidence of discitis, or bone lesion. Cord: Normal signal and morphology. No thoracic epidural space collection. Paraspinal and other soft tissues: Trace bilateral pleural effusions. Disc levels: Tiny shallow noncompressive disc protrusions at T3-T4 and T11-T12. Remaining discs are normal. No facet arthropathy. No  foraminal or canal stenosis at any level. IMPRESSION: 1. No acute abnormality of the thoracic spine. No evidence of thoracic discitis-osteomyelitis or epidural abscess. 2. Trace bilateral pleural effusions. Electronically Signed   By: Duanne Guess D.O.   On: 06/10/2023 18:00      Fayrene Helper, PA-C 06/10/23 1928    Durwin Glaze, MD 06/10/23 [phone]

## 2023-06-10 NOTE — ED Provider Notes (Signed)
 [place] EMERGENCY DEPARTMENT AT [facility] Provider Note   CSN: [id number] Arrival date & time: 06/10/23  4782     History  Chief Complaint  Patient presents with   Neck Pain    Lauren Allen is a 33 y.o. female.  Patient with past medical history significant for epidural abscess, MRSA bacteremia, iron deficiency anemia, arm wounds, heroin abuse, anxiety, depression presents to the emergency department complaining of severe neck pain.  She states pain began on Thursday night.  She states that it worsened significantly during Friday and now rates pain as 9 out of 10 in severity.  She denies any known injury to the area.  She states that since this began she has had 2 episodes of pain in bilateral upper arms.  She denies any numbness or weakness in extremities.  The patient states she has used no illicit drugs since initially being diagnosed with the epidural abscess.  Plans were initially for follow-up MRI 2 weeks after discharge (discharge from hospital on October 24).  She has yet to have that scan and is currently scheduled for her MRI of her cervical spine on Monday of this upcoming week.  Patient also endorses strong urine odor and is concerned about possible UTI.  She denies fever, nausea, vomiting, shortness of breath.   Neck Pain      Home Medications Prior to Admission medications   Medication Sig Start Date End Date Taking? Authorizing Provider  acetaminophen (TYLENOL) 500 MG tablet Take 2 tablets (1,000 mg total) by mouth every 6 (six) hours. 02/15/23   Gwenevere Abbot, MD  ferrous sulfate 325 (65 FE) MG EC tablet Take 1 tablet (325 mg total) by mouth every other day. 03/02/23   Morrie Sheldon, MD  melatonin 5 MG TABS Take 1 tablet (5 mg total) by mouth at bedtime. 02/15/23   Gwenevere Abbot, MD  methadone (DOLOPHINE) 10 MG/ML solution Take 165 mg by mouth daily.    [provider]  naloxone (NARCAN) 0.4 MG/ML injection Inject 1 mL (0.4 mg total) into the  vein as needed for up to 1 dose (If pt is unresponsive or if RR<8). 02/15/23   Gwenevere Abbot, MD  naloxone [facility]) nasal spray 4 mg/0.1 mL Place 1 spray into the nose as needed, 02/15/23   Gwenevere Abbot, MD  naproxen (NAPROSYN) 500 MG tablet Take 1 tablet (500 mg total) by mouth 2 (two) times daily. 02/06/23   Pollyann Savoy, MD      Allergies    Sulfa antibiotics    Review of Systems   Review of Systems  Musculoskeletal:  Positive for neck pain.    Physical Exam Updated Vital Signs BP 107/63 (BP Location: Right Arm)   Pulse 79   Temp 98.6 F (37 C) (Oral)   Resp 18   Ht 5\' 3"  (1.6 m)   Wt 98.9 kg   LMP 05/27/2023   SpO2 99%   BMI 38.62 kg/m  Physical Exam Vitals and nursing note reviewed.  Constitutional:      General: She is not in acute distress.    Appearance: She is well-developed.  HENT:     Head: Normocephalic and atraumatic.  Eyes:     Conjunctiva/sclera: Conjunctivae normal.  Cardiovascular:     Rate and Rhythm: Normal rate and regular rhythm.  Pulmonary:     Effort: Pulmonary effort is normal. No respiratory distress.     Breath sounds: Normal breath sounds.  Abdominal:     Palpations: Abdomen is  soft.     Tenderness: There is no abdominal tenderness.  Musculoskeletal:        General: No swelling.     Cervical back: Neck supple.  Skin:    General: Skin is warm and dry.     Capillary Refill: Capillary refill takes less than 2 seconds.     Comments: Significant scarring to bilateral forearms  Neurological:     General: No focal deficit present.     Mental Status: She is alert.     Cranial Nerves: No cranial nerve deficit.     Sensory: No sensory deficit.     Motor: No weakness.  Psychiatric:        Mood and Affect: Mood normal.     ED Results / Procedures / Treatments   Labs (all labs ordered are listed, but only abnormal results are displayed) Labs Reviewed  COMPREHENSIVE METABOLIC PANEL - Abnormal; Notable for the following components:       Result Value   Sodium 134 (*)    Chloride 97 (*)    Glucose, Bld 100 (*)    Albumin 3.4 (*)    AST 14 (*)    Alkaline Phosphatase 129 (*)    All other components within normal limits  CBC - Abnormal; Notable for the following components:   WBC 16.8 (*)    RBC 5.19 (*)    Hemoglobin 8.7 (*)    HCT 31.9 (*)    MCV 61.5 (*)    MCH 16.8 (*)    MCHC 27.3 (*)    RDW 21.9 (*)    Platelets 436 (*)    All other components within normal limits  URINALYSIS, ROUTINE W REFLEX MICROSCOPIC - Abnormal; Notable for the following components:   Color, Urine AMBER (*)    APPearance CLOUDY (*)    Protein, ur 30 (*)    Leukocytes,Ua MODERATE (*)    Bacteria, UA MANY (*)    All other components within normal limits  HCG, SERUM, QUALITATIVE    EKG None  Radiology No results found.  Procedures Procedures    Medications Ordered in ED Medications  LORazepam (ATIVAN) injection 1 mg (0 mg Intravenous Hold 06/10/23 0539)  LORazepam (ATIVAN) injection 1 mg (1 mg Intravenous Given 06/10/23 0351)  morphine (PF) 4 MG/ML injection 4 mg (4 mg Intravenous Given 06/10/23 0537)    ED Course/ Medical Decision Making/ A&P Clinical Course as of 06/10/23 2130  Wynelle Link Jun 10, 2023  0625 Hx of cervical epidural abscess last year. Needed f/u MRI but has not had it, scheduled for MRI already. 3 days of neck pain. No focal neuro deficits. Ordered 1mg  of ativan for MRI, but became anxious. Provided morphine. Needs 2mg  of ativan. Ask neurosurgery before abx. Hx of arm scratches.  [CB]    Clinical Course User Index [CB] Lunette Stands, PA-C                                 Medical Decision Making Amount and/or Complexity of Data Reviewed Radiology: ordered.  Risk Prescription drug management.   This patient presents to the ED for concern of neck pain, this involves an extensive number of treatment options, and is a complaint that carries with it a high risk of complications and morbidity.  The differential  diagnosis includes epidural abscess, fracture, dislocation, disc herniation, others   Co morbidities that complicate the patient evaluation  History of epidural  abscess   Additional history obtained:   External records from outside source obtained and reviewed including medicine notes   Lab Tests:  I Ordered, and personally interpreted labs.  The pertinent results include: Leukocytosis with a white count of 16,800,Hemoglobin 8.7; UA cloudy with moderate leukocytes, many bacteria, greater than 50 WBC   Imaging Studies ordered:  I ordered imaging studies including MR cervical spine, thoracic spine, lumbar spine with and without contrast Ativan was administered for anxiety related to MRI.  Nursing staff reports calling MRI technician to find out timing of MRI in order to administer Ativan just prior to procedure.  Unfortunately there was a delay with MRI staff not getting patient until 45 minutes after the 1 mg Ativan was administered.  Patient was too anxious for scan.  Additional milligram of Ativan was ordered but MR staff reported they would have to wait for the "day team" to do the scan    Problem List / ED Course / Critical interventions / Medication management   I ordered medication including Ativan for MRI related anxiety Reevaluation of the patient after these medicines showed that the patient improved I have reviewed the patients home medicines and have made adjustments as needed   Social Determinants of Health:  Patient with history of daily tobacco use   Test / Admission - Considered:  Patient with severe neck pain, concern for possible repeat epidural abscess.  Patient unfortunately did not have her follow-up scan in early November as was planned.  With patient's white blood cell count and neck pain feel that patient needs MR for further evaluation.  Unfortunately due to timing patient having anxiety related to MRI patient will have to wait for the "day team" to  perform the imaging.  Patient care is being transferred to Baldo Ash, PA-C at shift handoff.  Disposition pending results of imaging and reassessment of patient.  If patient has no acute findings on imaging she will still need treatment for urinary tract infection. I am not initiated antibiotics at this time in case of epidural abscess. If there is an abscess neurosurgery consult will need to be obtained to see if they have plans for sample collection prior to antibiotic initiation.          Final Clinical Impression(s) / ED Diagnoses Final diagnoses:  Neck pain  Acute cystitis without hematuria    Rx / DC Orders ED Discharge Orders     None         Pamala Duffel 06/10/23 1610    Gilda Crease, MD 06/10/23 [phone]

## 2023-06-10 NOTE — ED Notes (Signed)
 Called MRI and advised they have not come to get patient yet meds had been given. Staff asked if she was ready this nurse advised I had called 30 minutes ago advising she was ready and meds give,. MRI stated they will be in route.

## 2023-06-10 NOTE — Hospital Course (Signed)
 25: amitted for neck pain and epidural abscess. Hisotry of IVDU and   Ncecl pain 2 days, UTI   MRI epidural abscess.  Nearusrug: medicine admission, CRP and ESR, make sure that is not acute  Hold off antibiotics  2/17: Pain in middle neck. No radiation down arms. No paresthesias. No fevers or chills. Per patient, told to only continue abx x 2 weeks. Cloudy urine ~1 week ago.

## 2023-06-10 NOTE — ED Triage Notes (Signed)
 The pt has  severe pain in her cervical spine  she was seen at an urgent care   earlier today and they sent her here to be seen for progressive central cervical and paracervical pain  lmp 2 weeks ago

## 2023-06-10 NOTE — ED Notes (Signed)
 Pt to MRI

## 2023-06-10 NOTE — ED Notes (Signed)
 Received a call from MRI, pt unable to get MRI completed. Pt very anxious, states she is unable to get the scan. Pt returned to room, MD notified.

## 2023-06-10 NOTE — ED Notes (Signed)
 Pt's blood pressure keeps trending down. I ran from MRI and got two of NS bags and am hanging both bags on a pump bolus giving to pt.

## 2023-06-10 NOTE — ED Notes (Signed)
 Patient transported to MRI with Charlean Sanfilippo, RN

## 2023-06-10 NOTE — ED Notes (Signed)
 Called MRI to confirm ready for transport to MRI, Staff stated they will send someone and to give ativan. This nurse administered ativan

## 2023-06-11 ENCOUNTER — Inpatient Hospital Stay: Admission: RE | Admit: 2023-06-11 | Payer: Medicaid Other | Source: Ambulatory Visit

## 2023-06-11 DIAGNOSIS — M869 Osteomyelitis, unspecified: Secondary | ICD-10-CM

## 2023-06-11 DIAGNOSIS — B9562 Methicillin resistant Staphylococcus aureus infection as the cause of diseases classified elsewhere: Secondary | ICD-10-CM

## 2023-06-11 DIAGNOSIS — G061 Intraspinal abscess and granuloma: Secondary | ICD-10-CM

## 2023-06-11 DIAGNOSIS — N3000 Acute cystitis without hematuria: Secondary | ICD-10-CM

## 2023-06-11 LAB — BASIC METABOLIC PANEL
Anion gap: 12 (ref 5–15)
BUN: 10 mg/dL (ref 6–20)
CO2: 24 mmol/L (ref 22–32)
Calcium: 8.7 mg/dL — ABNORMAL LOW (ref 8.9–10.3)
Chloride: 99 mmol/L (ref 98–111)
Creatinine, Ser: 0.64 mg/dL (ref 0.44–1.00)
GFR, Estimated: >60 mL/min (ref >60)
Glucose, Bld: 103 mg/dL — ABNORMAL HIGH (ref 70–99)
Potassium: 4.1 mmol/L (ref 3.5–5.1)
Sodium: 135 mmol/L (ref 135–145)

## 2023-06-11 LAB — CBC
HCT: 27.5 % — ABNORMAL LOW (ref 36.0–46.0)
Hemoglobin: 7.7 g/dL — ABNORMAL LOW (ref 12.0–15.0)
MCH: 17.2 pg — ABNORMAL LOW (ref 26.0–34.0)
MCHC: 28 g/dL — ABNORMAL LOW (ref 30.0–36.0)
MCV: 61.5 fL — ABNORMAL LOW (ref 80.0–100.0)
Platelets: 388 10*3/uL (ref 150–400)
RBC: 4.47 MIL/uL (ref 3.87–5.11)
RDW: 21.5 % — ABNORMAL HIGH (ref 11.5–15.5)
WBC: 12.3 10*3/uL — ABNORMAL HIGH (ref 4.0–10.5)
nRBC: 0 % (ref 0.0–0.2)

## 2023-06-11 LAB — MRSA NEXT GEN BY PCR, NASAL: MRSA by PCR Next Gen: DETECTED — AB

## 2023-06-11 MED ORDER — METHADONE HCL 10 MG PO TABS: 5.0000 mg | ORAL_TABLET | Freq: Every day | ORAL | Status: DC

## 2023-06-11 MED ORDER — MUPIROCIN 2 % EX OINT
1.0000 | TOPICAL_OINTMENT | Freq: Two times a day (BID) | CUTANEOUS | Status: DC
  Administered 2023-06-12 – 2023-06-13 (×4): 1 via NASAL
  Filled 2023-06-11 (×2): qty 22

## 2023-06-11 MED ORDER — METHADONE HCL 10 MG PO TABS: 160.0000 mg | ORAL_TABLET | Freq: Every day | ORAL | Status: DC

## 2023-06-11 MED ORDER — CHLORHEXIDINE GLUCONATE CLOTH 2 % EX PADS
6.0000 | MEDICATED_PAD | Freq: Every day | CUTANEOUS | Status: DC
  Administered 2023-06-12 – 2023-06-13 (×2): 6 via TOPICAL

## 2023-06-11 MED ORDER — VANCOMYCIN HCL 1750 MG/350ML IV SOLN
1750.0000 mg | INTRAVENOUS | Status: DC
  Administered 2023-06-11: 1750 mg via INTRAVENOUS
  Filled 2023-06-11: qty 350

## 2023-06-11 MED ORDER — METHADONE HCL 10 MG PO TABS
5.0000 mg | ORAL_TABLET | Freq: Every day | ORAL | Status: DC
  Administered 2023-06-12 – 2023-06-13 (×2): 5 mg via ORAL
  Filled 2023-06-11 (×2): qty 1

## 2023-06-11 MED ORDER — METHADONE HCL 10 MG PO TABS
160.0000 mg | ORAL_TABLET | Freq: Every day | ORAL | Status: DC
  Administered 2023-06-12 – 2023-06-13 (×2): 160 mg via ORAL
  Filled 2023-06-11 (×2): qty 16

## 2023-06-11 MED ORDER — SODIUM CHLORIDE 0.9 % IV SOLN
2.0000 g | Freq: Three times a day (TID) | INTRAVENOUS | Status: DC
  Administered 2023-06-11: 2 g via INTRAVENOUS
  Filled 2023-06-11: qty 12.5

## 2023-06-11 NOTE — Progress Notes (Signed)
 Subjective:  Overnight Events: Admitted overnight, vitals stable  Interval: Patient took her home dose of methadone. She takes it at 6-7 am daily. Patient feels well apart from her neck pain that worsens with looking left or right. Denies fever, chills, rash, or paresthesias/pain in the extremities. She states that her chronic upper extremity wounds have been healing and unharmed for a while. The patient says that her pain is about an 8/10 in severity and she has a 6/10 baseline chronic pain. 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  Objective:  Vital signs in last 24 hours: Vitals:   06/10/23 2205 06/11/23 0012 06/11/23 0520 06/11/23 0809  BP: 114/80 107/67 104/73 125/82  Pulse: 74 65 64 73  Resp: 20 18 18 18   Temp: 98.6 F (37 C) 98.3 F (36.8 C) 98.2 F (36.8 C) 98 F (36.7 C)  TempSrc: Oral Oral Oral Oral  SpO2: 99% 100% 99% 97%  Weight: 95.4 kg     Height: 5\' 3"  (1.6 m)      Weight change: -3.5 kg  Intake/Output Summary (Last 24 hours) at 06/11/2023 1013 Last data filed at 06/11/2023 0340 Gross per 24 hour  Intake 580 ml  Output --  Net 580 ml   Physical Exam Constitutional:      General: She is not in acute distress.    Appearance: Normal appearance. She is not ill-appearing.  HENT:     Head: Normocephalic.     Nose:     Comments: Normal to external appearance Neck:     Comments: Nontender c-spine, but reports pain with flexion and twisting of the neck. Cardiovascular:     Rate and Rhythm: Normal rate and regular rhythm.     Pulses: Normal pulses.  Pulmonary:     Effort: Pulmonary effort is normal.  Abdominal:     General: Abdomen is flat. There is no distension.     Palpations: Abdomen is soft. There is no mass.  Musculoskeletal:        General: No tenderness. Normal range of motion.     Cervical back: Neck supple. No rigidity or tenderness.  Skin:    Comments: Chronic, healing wounds to right  forearm. No signs of active infection or recent excoriation. Left arm is wrapped in bandage.  Neurological:     Mental Status: She is alert.     Assessment/Plan:  Principal Problem:   Discitis of cervical region Active Problems:   IDA (iron deficiency anemia)   Opioid use disorder   Cystitis   Osteomyelitis of multiple sites [facility])   Spinal epidural abscess   Acute cystitis without hematuria   Summary: Lauren Allen is a 33 y.o. female with a past medical history of anxiety, history of epidural abscess of cervical spine who presents to the emergency department with concerns of neck pain. MRI imaging showed ongoing/worsening of discitis/osteomyelitis with epidural abscess and patient admitted for further evaluation management.   #C3-C7 epidural abscess #Discitis of C4/C5, C5/C6 2-day history of midline neck pain worse with movement, no fever or chills. Prior admission October 2024 for epidural abscess 2/2 MRSA bacteremia for which she received ~1 week IV Vanc -> Oral linezolid but only took it for two weeks. MRI findings consistent with further destructive change at C4-5 and C5-6 as well as epidural abscess from C3-C7 w/ minimal epidural component without evidence of severe cord compression. CRP 9.8, ESR 42. Will admit for IV antibiotics. Neurosurgery (Dr. Franky Macho)  recommends full retreatment w/ IV antibiotics. Interventional Radiology states they do not do C spine bx/disc aspiration. ID will see the patient. Pharmacy has adjusted  -Pending ID recommendations -IV Vanco 2/16 >>  -IV Cefepime 2/17 >>  -2/16 BCx: NGTD -Pain control with toradol/oxycodone every 6 hours PRN to baseline 6/10 pain.   #Simple cystitis Patient has dysuria, cloudy urine, urinary frequency for 5 days. UA shows moderate leukocytes, >50 WBC but squamous epithelial noted too. She will be covered with empiric antibiotics. -Cefepime day 1 per above   #OUD Patient has a history of opioid use disorder, last use  prior to October 2024.  Patient is currently on methadone 165 mg daily. She goes to [facility] every Friday, who confirmed her dose. She takes it each morning at 6-7am. -Methadone 165 mg each morning   #Iron deficiency anemia Patient has a past medical history of iron deficiency anemia, and is currently on iron supplementation. Patient had iron studies back in October 2024 showing ferritin of 17, serum iron level of 13, and TIBC of 396. Hemoglobin today is 7.7. No acute concern for bleed at this time. Given active infection, will hold iron supplementation at this time. -Hold iron supplementation   Diet: Normal VTE: DOAC IVF: None,None Code: Full   Prior to Admission Living Arrangement: Home, with family Anticipated Discharge Location: Home Barriers to Discharge: Clinical improvement, definitive antibiotic plan  Dispo: Admit patient to Inpatient with expected length of stay greater than 2 midnights.   LOS: 1 day   Kayleen Memos, Medical Student 06/11/2023, 10:13 AM  Attestation for Student Documentation:  I personally was present and performed or re-performed the history, physical exam and medical decision-making activities of this service and have verified that the service and findings are accurately documented in the student's note.  Morrie Sheldon, MD 06/11/2023, 2:19 PM

## 2023-06-11 NOTE — Consult Note (Signed)
 [facility]  Total days of antibiotics 2       Reason for Consult:MRSA Cervical epidural abscess/discitis   Referring Physician: Lafonda Mosses Principal Problem:   Discitis of cervical region Active Problems:   IDA (iron deficiency anemia)   Opioid use disorder   Cystitis   Osteomyelitis of multiple sites [facility])   Spinal epidural abscess   Acute cystitis without hematuria    HPI: Lauren Allen is a 33 y.o. female with hx of crohn's disease, depression, methadone for OUD, admitted for worsening neck pain and headache. She was hospitalized in October for MRSA bacteremia (blood cx + 10/16) and epidural abscess, recommendations at that time to treat with 8 wks of MRSA treatment, initially on IV abtx  from 10/18 when blood cx cleared but decision was made to transition to oral linezolid for which she took through 03/01/23. She had followed up with IM resident clinic at that time, but unclear why patient had not received refills or mention on subsequent appointments that she was no longer on abtx. She presented to the ED on 2/16 with worsening neck pain. But denies any numbness or weakness/parasthesia to arms. She underwent repeat MRI which showed new findings of C4-5 Discitis/OM with circumferential epidural abscess. Now spanning from C3-C7 with mild effacement. Neurosurgery evaluated patient, not felt to be surgical candidate. Patient started on vanco/cefepime. ID asked to weigh in on abtx  Past Medical History:  Diagnosis Date   Anxiety    Crohn's disease (HCC)    Depression    Headache(784.0)    Ileitis    Opiate addiction (HCC)     Allergies:  Allergies  Allergen Reactions   Sulfa Antibiotics Palpitations   MEDICATIONS:  melatonin  5 mg Oral QHS   [START ON 06/12/2023] methadone  160 mg Oral Daily   And   [START ON 06/12/2023] methadone  5 mg Oral Daily   rivaroxaban  10 mg Oral Daily    Social History   Tobacco Use   Smoking status: Some Days     Current packs/day: 0.00    Types: Cigarettes    Last attempt to quit: 12/01/2016    Years since quitting: 6.5   Smokeless tobacco: Never  Vaping Use   Vaping status: Former  Substance Use Topics   Alcohol use: Not Currently   Drug use: Not Currently    Comment: heroin clean 6 yrs, minor relapse in june 2024    Family History  Problem Relation Age of Onset   Colon cancer Maternal Grandfather    Celiac disease Maternal Aunt    Diabetes Maternal Grandmother    Hypothyroidism Maternal Grandmother    Heart disease Unknown    Diabetes Mother    Hypertension Mother    Hypothyroidism Mother    Hypertension Father    Heart disease Father    Hypothyroidism Paternal Grandmother     Review of Systems -  Mild neck pain. No parasthesia. Weakness to arms and legs. 12 point ros  OBJECTIVE: Temp:  [98 F (36.7 C)-98.6 F (37 C)] 98 F (36.7 C) (02/17 1136) Pulse Rate:  [52-74] 64 (02/17 1136) Resp:  [16-20] 18 (02/17 1136) BP: (75-125)/(40-82) 113/70 (02/17 1136) SpO2:  [94 %-100 %] 100 % (02/17 1136) Weight:  [95.4 kg] 95.4 kg (02/16 2205) Physical Exam  Constitutional:  oriented to person, place, and time. appears well-developed and well-nourished. No distress.  HENT: [place]/AT, PERRLA, no scleral icterus Mouth/Throat: Oropharynx is clear and moist. No oropharyngeal  exudate.  Cardiovascular: Normal rate, regular rhythm and normal heart sounds. Exam reveals no gallop and no friction rub.  No murmur heard.  Pulmonary/Chest: Effort normal and breath sounds normal. No respiratory distress.  has no wheezes.  Neck = supple, no nuchal rigidity Abdominal: Soft. Bowel sounds are normal.  exhibits no distension. There is no tenderness.  Lymphadenopathy: no cervical adenopathy. No axillary adenopathy Neurological: alert and oriented to person, place, and time.  Skin: Skin is warm and dry. No rash noted. No erythema.  Psychiatric: a normal mood and affect.  behavior is normal.   LABS: Results  for orders placed or performed during the hospital encounter of 06/10/23 (from the past 48 hours)  Comprehensive metabolic panel     Status: Abnormal   Collection Time: 06/10/23 12:59 AM  Result Value Ref Range   Sodium 134 (L) 135 - 145 mmol/L   Potassium 4.0 3.5 - 5.1 mmol/L   Chloride 97 (L) 98 - 111 mmol/L   CO2 24 22 - 32 mmol/L   Glucose, Bld 100 (H) 70 - 99 mg/dL    Comment: Glucose reference range applies only to samples taken after fasting for at least 8 hours.   BUN 8 6 - 20 mg/dL   Creatinine, Ser 7.84 0.44 - 1.00 mg/dL   Calcium 9.5 8.9 - 69.6 mg/dL   Total Protein 8.1 6.5 - 8.1 g/dL   Albumin 3.4 (L) 3.5 - 5.0 g/dL   AST 14 (L) 15 - 41 U/L   ALT 20 0 - 44 U/L   Alkaline Phosphatase 129 (H) 38 - 126 U/L   Total Bilirubin 0.6 0.0 - 1.2 mg/dL   GFR, Estimated >29 >52 mL/min    Comment: (NOTE) Calculated using the CKD-EPI Creatinine Equation (2021)    Anion gap 13 5 - 15    Comment: Performed at [facility], [address], [place], [place] [address]  CBC     Status: Abnormal   Collection Time: 06/10/23 12:59 AM  Result Value Ref Range   WBC 16.8 (H) 4.0 - 10.5 K/uL   RBC 5.19 (H) 3.87 - 5.11 MIL/uL   Hemoglobin 8.7 (L) 12.0 - 15.0 g/dL    Comment: Reticulocyte Hemoglobin testing may be clinically indicated, consider ordering this additional test [id number]    HCT 31.9 (L) 36.0 - 46.0 %   MCV 61.5 (L) 80.0 - 100.0 fL   MCH 16.8 (L) 26.0 - 34.0 pg   MCHC 27.3 (L) 30.0 - 36.0 g/dL   RDW 53.6 (H) 64.4 - 03.4 %   Platelets 436 (H) 150 - 400 K/uL    Comment: REPEATED TO VERIFY   nRBC 0.0 0.0 - 0.2 %    Comment: Performed at [facility], [address] [place], [place] [address]  hCG, serum, qualitative     Status: None   Collection Time: 06/10/23 12:59 AM  Result Value Ref Range   Preg, Serum NEGATIVE NEGATIVE    Comment:        THE SENSITIVITY OF THIS METHODOLOGY IS >10 mIU/mL. Performed at [facility], [address], [place], [place]  [address]   Urinalysis, Routine w reflex microscopic -Urine, Clean Catch     Status: Abnormal   Collection Time: 06/10/23  1:00 AM  Result Value Ref Range   Color, Urine AMBER (A) YELLOW    Comment: BIOCHEMICALS MAY BE AFFECTED BY COLOR   APPearance CLOUDY (A) CLEAR   Specific Gravity, Urine 1.021 1.005 -  1.030   pH 6.0 5.0 - 8.0   Glucose, UA NEGATIVE NEGATIVE mg/dL   Hgb urine dipstick NEGATIVE NEGATIVE   Bilirubin Urine NEGATIVE NEGATIVE   Ketones, ur NEGATIVE NEGATIVE mg/dL   Protein, ur 30 (A) NEGATIVE mg/dL   Nitrite NEGATIVE NEGATIVE   Leukocytes,Ua MODERATE (A) NEGATIVE   RBC / HPF 11-20 0 - 5 RBC/hpf   WBC, UA >50 0 - 5 WBC/hpf   Bacteria, UA MANY (A) NONE SEEN   Squamous Epithelial / HPF >50 0 - 5 /HPF   Mucus PRESENT     Comment: Performed at [facility], [address], [place], [place] [address]  Blood culture (routine x 2)     Status: None (Preliminary result)   Collection Time: 06/10/23  8:20 AM   Specimen: BLOOD RIGHT FOREARM  Result Value Ref Range   Specimen Description BLOOD RIGHT FOREARM    Special Requests      BOTTLES DRAWN AEROBIC AND ANAEROBIC Blood Culture results may not be optimal due to an inadequate volume of blood received in culture bottles   Culture      NO GROWTH < 24 HOURS Performed at [facility], [address], [place], [place] [address]    Report Status PENDING   Lactic acid, plasma     Status: None   Collection Time: 06/10/23  9:35 AM  Result Value Ref Range   Lactic Acid, Venous 1.0 0.5 - 1.9 mmol/L    Comment: Performed at [facility], [address] [place], [place] [address]  Blood culture (routine x 2)     Status: None (Preliminary result)   Collection Time: 06/10/23  6:17 PM   Specimen: BLOOD RIGHT HAND  Result Value Ref Range   Specimen Description BLOOD RIGHT HAND    Special Requests      BOTTLES DRAWN AEROBIC AND ANAEROBIC Blood Culture results may not be optimal due to an inadequate volume of blood  received in culture bottles   Culture      NO GROWTH < 24 HOURS Performed at [facility], [address], [place], [place] [address]    Report Status PENDING   Sedimentation rate     Status: Abnormal   Collection Time: 06/10/23  7:20 PM  Result Value Ref Range   Sed Rate 42 (H) 0 - 22 mm/hr    Comment: Performed at [facility], [address], [place], [place] [address]  C-reactive protein     Status: Abnormal   Collection Time: 06/10/23  7:20 PM  Result Value Ref Range   CRP 9.8 (H) <1.0 mg/dL    Comment: Performed at [facility], [address], [place], [place] [address]  Basic metabolic panel     Status: Abnormal   Collection Time: 06/11/23  4:52 AM  Result Value Ref Range   Sodium 135 135 - 145 mmol/L   Potassium 4.1 3.5 - 5.1 mmol/L   Chloride 99 98 - 111 mmol/L   CO2 24 22 - 32 mmol/L   Glucose, Bld 103 (H) 70 - 99 mg/dL    Comment: Glucose reference range applies only to samples taken after fasting for at least 8 hours.   BUN 10 6 - 20 mg/dL   Creatinine, Ser 1.02 0.44 - 1.00 mg/dL   Calcium 8.7 (L) 8.9 - 10.3 mg/dL   GFR, Estimated >72 >53 mL/min    Comment: (NOTE) Calculated using the CKD-EPI Creatinine Equation (  2021)    Anion gap 12 5 - 15    Comment: Performed at [facility], [address], [place], [place] [address]  CBC     Status: Abnormal   Collection Time: 06/11/23  4:52 AM  Result Value Ref Range   WBC 12.3 (H) 4.0 - 10.5 K/uL   RBC 4.47 3.87 - 5.11 MIL/uL   Hemoglobin 7.7 (L) 12.0 - 15.0 g/dL    Comment: Reticulocyte Hemoglobin testing may be clinically indicated, consider ordering this additional test [id number]    HCT 27.5 (L) 36.0 - 46.0 %   MCV 61.5 (L) 80.0 - 100.0 fL   MCH 17.2 (L) 26.0 - 34.0 pg   MCHC 28.0 (L) 30.0 - 36.0 g/dL   RDW 11.9 (H) 14.7 - 82.9 %   Platelets 388 150 - 400 K/uL    Comment: REPEATED TO VERIFY   nRBC 0.0 0.0 - 0.2 %    Comment: Performed at [facility], [address]  [place], [place] [address]    MICRO: 2/16 blood cx NGTD IMAGING: MR Lumbar Spine W Wo Contrast Result Date: 06/10/2023 CLINICAL DATA:  Back pain.  History of spine infection EXAM: MRI LUMBAR SPINE WITHOUT AND WITH CONTRAST TECHNIQUE: Multiplanar and multiecho pulse sequences of the lumbar spine were obtained without and with intravenous contrast. CONTRAST:  10mL GADAVIST GADOBUTROL 1 MMOL/ML IV SOLN COMPARISON:  None Available. FINDINGS: Segmentation:  Standard. Alignment:  Physiologic. Vertebrae:  No fracture, evidence of discitis, or bone lesion. Conus medullaris and cauda equina: Conus extends to the L1 level. Conus and cauda equina appear normal. No fluid collections within the epidural space of the lumbar spine. Paraspinal and other soft tissues: No paraspinal inflammatory changes. Disc levels: Mild disc desiccation at L5-S1 with mild left foraminal disc bulge contributing to mild left foraminal stenosis. No canal stenosis. Remaining lumbar intervertebral disc levels are otherwise normal without foraminal or canal stenosis. IMPRESSION: 1. No acute abnormality of the lumbar spine. No evidence of lumbar discitis-osteomyelitis or epidural abscess. 2. Mild left foraminal stenosis at L5-S1. Electronically Signed   By: Duanne Guess D.O.   On: 06/10/2023 18:13   MR Cervical Spine W and Wo Contrast Result Date: 06/10/2023 CLINICAL DATA:  Cervical discitis-osteomyelitis EXAM: MRI CERVICAL SPINE WITHOUT AND WITH CONTRAST TECHNIQUE: Multiplanar and multiecho pulse sequences of the cervical spine, to include the craniocervical junction and cervicothoracic junction, were obtained without and with intravenous contrast. CONTRAST:  10mL GADAVIST GADOBUTROL 1 MMOL/ML IV SOLN COMPARISON:  02/07/2023, 02/12/2023 FINDINGS: Alignment: New focal kyphosis of the cervical cord centered at the C5-C6 level. Vertebrae: Findings of ongoing discitis-osteomyelitis of C5-C6 with destructive endplate changes and partial collapse of  the C6 superior endplate contributing to new focal kyphosis at this level. Marrow edema and confluent low T1 signal changes within both vertebral bodies. Small amount of enhancing fluid is now present within the C4-C5 disc with mild marrow edema throughout the C4 vertebral body. Small bilateral facet joint effusions at C5 and C6 as well as trace bilateral C6-7 facet effusions. Cord: Circumferential epidural abscess centered at C5-6 but spanning between the C3 and C7 levels with effacement of the thecal sac. Mild cord compression spanning between the C4-C5 and C6-C7 levels. No appreciable cord edema. Posterior Fossa, vertebral arteries, paraspinal tissues: Marked progression of prevertebral inflammatory changes with a large amount of enhancing phlegmonous material in the prevertebral, retropharyngeal, and pharyngeal spaces. Disc levels: Collapse of the C5-C6 disc space. Elsewhere, no significant degenerative  findings. IMPRESSION: 1. Findings of ongoing/worsening discitis-osteomyelitis of C5-C6 with destructive endplate changes and partial collapse of the C6 superior endplate contributing to new focal kyphosis at this level. 2. New findings of discitis-osteomyelitis at C4-C5. 3. Circumferential epidural abscess spanning between the C3 and C7 levels with effacement of the thecal sac. Mild cord compression spanning between the C4-C5 and C6-C7 levels. No appreciable cord edema. Neurosurgical consultation is recommended. 4. Marked progression of prevertebral inflammatory changes with a large amount of enhancing phlegmonous material in the prevertebral, retropharyngeal, and pharyngeal spaces. Electronically Signed   By: Duanne Guess D.O.   On: 06/10/2023 18:11   MR THORACIC SPINE W WO CONTRAST Result Date: 06/10/2023 CLINICAL DATA:  Mid back pain.  History of epidural abscess EXAM: MRI THORACIC WITHOUT AND WITH CONTRAST TECHNIQUE: Multiplanar and multiecho pulse sequences of the thoracic spine were obtained without  and with intravenous contrast. CONTRAST:  10mL GADAVIST GADOBUTROL 1 MMOL/ML IV SOLN COMPARISON:  MRI 02/07/2023 FINDINGS: Alignment:  Physiologic. Vertebrae: No fracture, evidence of discitis, or bone lesion. Cord: Normal signal and morphology. No thoracic epidural space collection. Paraspinal and other soft tissues: Trace bilateral pleural effusions. Disc levels: Tiny shallow noncompressive disc protrusions at T3-T4 and T11-T12. Remaining discs are normal. No facet arthropathy. No foraminal or canal stenosis at any level. IMPRESSION: 1. No acute abnormality of the thoracic spine. No evidence of thoracic discitis-osteomyelitis or epidural abscess. 2. Trace bilateral pleural effusions. Electronically Signed   By: Duanne Guess D.O.   On: 06/10/2023 18:00    Assessment/Plan:  32yo F with hx of partially treated MRSA bacteremia and epidural abscess - she likely had sufficient abtx in Fall 2024 for bacteremia but insufficient for epidural abscess.  - patient has numerous social variables which would preclude from long term IV abtx. - recommend to wait 72hr to see that her blood cx have remained negative to see if need to do TEE vs. TTE - we will devise o/p regimen -- likely will do dalbavancin injection upon discharge. Will need to schedule appt at [facility] infusion center (Since can use ultrasound guidance to help establish piv) for 2nd dose in 1 wk  - continue with vancomycin now.  OUD =continue on methadone  Long term medication management/SE = will check vanco levels to ensure at appropriate dosing   MRSA -contact isolation  Aeva Posey B. Drue Second MD MPH [facility] [phone]

## 2023-06-11 NOTE — Plan of Care (Signed)

## 2023-06-11 NOTE — Plan of Care (Signed)
 IR was requested for "osteomyelitis changes at C4-5 and C5-6 " by ID.    ID was informed that IR does not perform C spine bx/disc aspiration.    Will delete the IR rad eval order.  Please call IR for questions and concerns.   Lynann Bologna Kelyn Ponciano PA-C 06/11/2023 10:55 AM

## 2023-06-11 NOTE — Progress Notes (Signed)
 Laboratory studies demonstrate elevated sedimentation rate and see reactive protein consistent with incompletely treated cervical osteomyelitis discitis.  Patient with further destructive change at C4-5 and C5-6.  Minimal epidural component without evidence of severe cord compression.  Recommend renewed IV antibiotic treatment.  As Dr. Franky Macho previously treated this patient he will resume care today.

## 2023-06-11 NOTE — Progress Notes (Signed)
 Pharmacy Antibiotic Note  Lauren Allen is a 33 y.o. female admitted on 06/10/2023 with  epidural abscess .  Pharmacy has been consulted for vanco / cefepime dosing.  Plan: Cefepime 2 gram iv q8h Vanco 2000 mg iv (given in ED 2/16 PM) f/b 1750 mg iv q24h (start 2/17 2030) eAUC 484 Scr 0.7  Height: 5\' 3"  (160 cm) Weight: 95.4 kg (210 lb 5.1 oz) IBW/kg (Calculated) : 52.4  Temp (24hrs), Avg:98.3 F (36.8 C), Min:98 F (36.7 C), Max:98.6 F (37 C)  Recent Labs  Lab 06/10/23 0059 06/10/23 0935 06/11/23 0452  WBC 16.8*  --  12.3*  CREATININE 0.70  --  0.64  LATICACIDVEN  --  1.0  --     Estimated Creatinine Clearance: 109.9 mL/min (by C-G formula based on SCr of 0.64 mg/dL).    Allergies  Allergen Reactions   Sulfa Antibiotics Palpitations    Antimicrobials this admission: Vanco 2/16 >>  Cefepime 2/17 >>  CRO 2/16 >> 2/16   Microbiology results: 2/16 BCx: [id number]   Thank you for allowing pharmacy to be a part of this patient's care.  Greta Doom BS, PharmD, BCPS Clinical Pharmacist 06/11/2023 10:40 AM  Contact: [phone] after 3 PM  "Be curious, not judgmental..." -Debbora Dus

## 2023-06-12 ENCOUNTER — Other Ambulatory Visit (HOSPITAL_COMMUNITY): Payer: Self-pay

## 2023-06-12 ENCOUNTER — Other Ambulatory Visit: Payer: Self-pay | Admitting: Infectious Diseases

## 2023-06-12 ENCOUNTER — Telehealth: Payer: Self-pay

## 2023-06-12 DIAGNOSIS — A4902 Methicillin resistant Staphylococcus aureus infection, unspecified site: Secondary | ICD-10-CM | POA: Insufficient documentation

## 2023-06-12 LAB — CBC WITH DIFFERENTIAL/PLATELET
Abs Immature Granulocytes: 0.05 10*3/uL (ref 0.00–0.07)
Basophils Absolute: 0 10*3/uL (ref 0.0–0.1)
Basophils Relative: 0 %
Eosinophils Absolute: 0.2 10*3/uL (ref 0.0–0.5)
Eosinophils Relative: 2 %
HCT: 27.9 % — ABNORMAL LOW (ref 36.0–46.0)
Hemoglobin: 7.7 g/dL — ABNORMAL LOW (ref 12.0–15.0)
Immature Granulocytes: 1 %
Lymphocytes Relative: 24 %
Lymphs Abs: 2.3 10*3/uL (ref 0.7–4.0)
MCH: 16.9 pg — ABNORMAL LOW (ref 26.0–34.0)
MCHC: 27.6 g/dL — ABNORMAL LOW (ref 30.0–36.0)
MCV: 61.3 fL — ABNORMAL LOW (ref 80.0–100.0)
Monocytes Absolute: 0.5 10*3/uL (ref 0.1–1.0)
Monocytes Relative: 5 %
Neutro Abs: 6.7 10*3/uL (ref 1.7–7.7)
Neutrophils Relative %: 68 %
Platelets: 376 10*3/uL (ref 150–400)
RBC: 4.55 MIL/uL (ref 3.87–5.11)
RDW: 21.2 % — ABNORMAL HIGH (ref 11.5–15.5)
WBC: 9.7 10*3/uL (ref 4.0–10.5)
nRBC: 0 % (ref 0.0–0.2)

## 2023-06-12 LAB — BASIC METABOLIC PANEL
Anion gap: 12 (ref 5–15)
BUN: 12 mg/dL (ref 6–20)
CO2: 27 mmol/L (ref 22–32)
Calcium: 9.3 mg/dL (ref 8.9–10.3)
Chloride: 99 mmol/L (ref 98–111)
Creatinine, Ser: 0.65 mg/dL (ref 0.44–1.00)
GFR, Estimated: >60 mL/min (ref >60)
Glucose, Bld: 94 mg/dL (ref 70–99)
Potassium: 4.1 mmol/L (ref 3.5–5.1)
Sodium: 138 mmol/L (ref 135–145)

## 2023-06-12 MED ORDER — NITROFURANTOIN MONOHYD MACRO 100 MG PO CAPS
100.0000 mg | ORAL_CAPSULE | Freq: Two times a day (BID) | ORAL | Status: DC
  Filled 2023-06-12 (×2): qty 1

## 2023-06-12 MED ORDER — ORITAVANCIN DIPHOSPHATE 400 MG IV SOLR
1200.0000 mg | Freq: Once | INTRAVENOUS | Status: AC
  Administered 2023-06-12: 1200 mg via INTRAVENOUS
  Filled 2023-06-12: qty 120

## 2023-06-12 MED ORDER — LINEZOLID 600 MG PO TABS
600.0000 mg | ORAL_TABLET | Freq: Two times a day (BID) | ORAL | 0 refills | Status: DC
  Filled 2023-06-12: qty 28, 14d supply, fill #0

## 2023-06-12 MED ORDER — FOSFOMYCIN TROMETHAMINE 3 G PO PACK
3.0000 g | PACK | Freq: Once | ORAL | Status: AC
  Administered 2023-06-12: 3 g via ORAL
  Filled 2023-06-12: qty 3

## 2023-06-12 MED ORDER — VANCOMYCIN HCL 750 MG/150ML IV SOLN
750.0000 mg | Freq: Two times a day (BID) | INTRAVENOUS | Status: DC
  Filled 2023-06-12: qty 150

## 2023-06-12 NOTE — Progress Notes (Addendum)
 Pharmacy Antibiotic Note  Lauren Allen is a 33 y.o. female admitted on 06/10/2023 with persistent cervical discitis/osteo with epidural abscess after partial treatment in Oct '24. Pharmacy has been consulted for Vancomycin dosing.  SCr stable - will adjust the Vancomycin dose to optimize PK/PD parameters and keep VT >10 mcg/ml.   Attempting to figure out patient assistance options for long-acting agents such as Dalbavancin. Might have to consider oral linezolid bridge pending patient assistance options.   Plan: - Adjust Vancomycin to 750 mg IV every 12 hours (eAUC 430, VT 12, SCr rounded 0.8, Vd 0.5) - Will continue to follow renal function, culture results, LOT, and antibiotic de-escalation plans   Addendum: Planning to transition to Oritavancin 1200 mg IV x 1 dose this evening. Dalbavancin patient assistance being worked on - outpatient infusion appointment set up for 2/26 for if patient assistance is approved. The patient will be discharged with a 4 week supply of linezolid as a back-up to start next Wednesday, 2/26 or when informed to do so by her ID provider. The patient is set up to see Marcos Eke at [facility] on 2/25 for follow-up.   Georgina Pillion, PharmD, BCPS, BCIDP Infectious Diseases Clinical Pharmacist 06/12/2023 2:02 PM    Height: 5\' 3"  (160 cm) Weight: 95.4 kg (210 lb 5.1 oz) IBW/kg (Calculated) : 52.4  Temp (24hrs), Avg:98.2 F (36.8 C), Min:97.6 F (36.4 C), Max:99.1 F (37.3 C)  Recent Labs  Lab 06/10/23 0059 06/10/23 0935 06/11/23 0452 06/12/23 0610  WBC 16.8*  --  12.3* 9.7  CREATININE 0.70  --  0.64 0.65  LATICACIDVEN  --  1.0  --   --     Estimated Creatinine Clearance: 109.9 mL/min (by C-G formula based on SCr of 0.65 mg/dL).    Allergies  Allergen Reactions   Sulfa Antibiotics Palpitations    Antimicrobials this admission: Vancomycin 2/16 >> Cefepime 2/16 >>  Dose adjustments this admission:   Microbiology results: 2/17 MRSA PCR >>  positive 2/16 BCx >> [id number]  Thank you for allowing pharmacy to be a part of this patient's care.  Georgina Pillion, PharmD, BCPS, BCIDP Infectious Diseases Clinical Pharmacist 06/12/2023 10:37 AM   **Pharmacist phone directory can now be found on [email or web address] ([email or web address]).  Listed under [facility].

## 2023-06-12 NOTE — Progress Notes (Signed)
 Patient ID: Lauren Allen, female   DOB: 09-26-1990, 33 y.o.   MRN: [id number] BP (!) 120/55   Pulse 73   Temp 98.4 F (36.9 C)   Resp 18   Ht 5\' 3"  (1.6 m)   Wt 95.4 kg   LMP 05/27/2023   SpO2 100%   BMI 37.26 kg/m  Alert and oriented x 4, speech is clear and fluent. Moving all extremities well, strength is 5/5 . No radicular pain from cervical region.  No indications for operative treatment.  Reports less pain. Improved and currently doing well.

## 2023-06-12 NOTE — Plan of Care (Signed)

## 2023-06-12 NOTE — Progress Notes (Addendum)
 Subjective:  Overnight Events: No acute events overnight  Interval: Patient says that her pain is improved as well as her range of motion of the neck. She denies fever, chills, shortness of breath, paresthesias. The patient feels a 4/10 pain at this time, which she states is better than her baseline 6/10 pain. She inquires about having a different neurosurgeon on discharge. Yarexi notes that her dysuria, odor, cloudiness of urination have improved, but not resolved.  Objective:  Vital signs in last 24 hours: Vitals:   06/11/23 1610 06/11/23 1943 06/12/23 0416 06/12/23 0807  BP: 106/63 123/77 (!) 92/58 117/81  Pulse: 60 70 (!) 56 65  Resp: 17 18 18    Temp: 98.4 F (36.9 C) 99.1 F (37.3 C) 97.6 F (36.4 C) 98 F (36.7 C)  TempSrc: Oral Oral Oral   SpO2: 94% 100% 100% 100%  Weight:      Height:       Weight change:   Intake/Output Summary (Last 24 hours) at 06/12/2023 1141 Last data filed at 06/12/2023 0900 Gross per 24 hour  Intake 590 ml  Output --  Net 590 ml   Physical Exam Constitutional:      General: She is not in acute distress.    Appearance: Normal appearance. She is not ill-appearing.  HENT:     Head: Normocephalic.     Nose:     Comments: Normal to external appearance Neck:     Comments: Nontender c-spine, but reports pain with flexion and twisting of the neck. Range of motion improved from yesterday. Cardiovascular:     Rate and Rhythm: Normal rate and regular rhythm.     Pulses: Normal pulses.  Pulmonary:     Effort: Pulmonary effort is normal.  Abdominal:     General: Abdomen is flat. There is no distension.     Palpations: Abdomen is soft. There is no mass.  Musculoskeletal:        General: No tenderness. Normal range of motion.     Cervical back: Neck supple. No rigidity or tenderness.  Skin:    Comments: Chronic, healing wounds to right forearm. No signs of active infection or recent excoriation. Left arm is wrapped in bandage.   Neurological:     Mental Status: She is alert.     Assessment/Plan:  Principal Problem:   Discitis of cervical region Active Problems:   IDA (iron deficiency anemia)   Opioid use disorder   Cystitis   Osteomyelitis of multiple sites (HCC)   Spinal epidural abscess   Acute cystitis without hematuria   MRSA infection   Summary: Lauren Allen is a 33 y.o. female with a past medical history of anxiety, history of epidural abscess of cervical spine who presents to the emergency department with concerns of neck pain. MRI imaging showed ongoing/worsening of discitis/osteomyelitis with epidural abscess and patient admitted for evaluation and treatment.   #C3-C7 epidural abscess #Discitis of C4/C5, C5/C6 Admitted w/ 2-day history of midline neck pain worse with movement, no fever or chills. CRP 9.8, ESR 42. Prior admission October 2024 for epidural abscess 2/2 MRSA bacteremia for which she received ~1 week IV Vanc -> Oral linezolid but only took it for two weeks. MRI findings consistent with further destructive change at C4-5 and C5-6 as well as epidural abscess from C3-C7 w/ minimal epidural component without evidence of severe cord compression.  2/18: Patient feels better today. She is on Vanc monotherapy w/ plans to discharge potentially tomorrow (if Bcx negative x3 days)  on Dalbavancin once weekly injection for 4 weeks in clinic and then switching to oral abx. -TTE today -IV Vanco 2/16 >>  -2/16 BCx: NGTD 2 days -Pain control with toradol/oxycodone every 6 hours PRN to baseline 6/10 pain   #Simple cystitis Patient has dysuria, cloudy urine, urinary frequency for 5 days. UA shows moderate leukocytes, >50 WBC but squamous epithelial noted too. She will be covered with empiric antibiotics. -Oral Bactrim (TMP-SMX) 160/800 BID x3 days   #OUD Patient has a history of opioid use disorder, last use prior to October 2024.  Patient is currently on methadone 165 mg daily. She goes to  [facility] every Friday, who confirmed her dose. She takes it each morning at 6-7am. -Methadone 165 mg each morning   #Iron deficiency anemia Patient has a past medical history of iron deficiency anemia, and is currently on iron supplementation. Patient had iron studies back in October 2024 showing ferritin of 17, serum iron level of 13, and TIBC of 396. Hemoglobin is 7.7, stable from yesterday. No acute concern for bleed at this time. Given active infection, will hold iron supplementation at this time. -Hold iron supplementation   Diet: Normal VTE: DOAC IVF: None,None Code: Full   Prior to Admission Living Arrangement: Home, with family Anticipated Discharge Location: Home Barriers to Discharge: Clinical improvement, definitive antibiotic plan  Dispo: Admit patient to Inpatient with expected length of stay greater than 2 midnights.   LOS: 2 days   Kayleen Memos, Medical Student 06/12/2023, 11:41 AM  Attestation for Student Documentation:  I personally was present and performed or re-performed the history, physical exam and medical decision-making activities of this service and have verified that the service and findings are accurately documented in the student's note.  Morrie Sheldon, MD 06/12/2023, 2:32 PM

## 2023-06-12 NOTE — Telephone Encounter (Signed)
 Per Durwin Nora, Np faxed order to [facility] (short stay) for IV antibiotics.  ORDER: ICD Code: G06.1, A49.02  -Dalvance 1 gm weekly x 4 doses (I already called to get appts arranged so first dose on 2/26)  - Weekly Labs: CBC with Diff, CMP, CRP, ESR - PRN zofran 4 mg injection x 1 as well for nausea / vomiting  Inpatient pharmacy team working on patient assistance.  Orders faxed to [phone]. Requested short stay staff schedule remanding appts for weekly infusion after first dose.   Juanita Laster, RMA

## 2023-06-12 NOTE — Progress Notes (Signed)
   06/12/23 1344  TOC Brief Assessment  Insurance and Status Lapsed (Medicaid POTENTIAL)  Patient has primary care physician Yes Rayvon Char, Fredricka Bonine, MD)  Home environment has been reviewed From home with Husband  Prior level of function: independent  Prior/Current Home Services No current home services  Social Drivers of Health Review SDOH reviewed interventions complete (Smoking cessation information attached to DC instructions)  Readmission risk has been reviewed Yes (13%)  Transition of care needs transition of care needs identified, TOC will continue to follow (Will need MATCH)   TOC will continue to follow patient for any additional discharge needs. MATCH completed

## 2023-06-12 NOTE — TOC CM/SW Note (Signed)
 MATCH MEDICATION ASSISTANCE CARD Pharmacies please call [phone] for claim processing assistance.  Rx BIN: [id number] Rx Group: [id number] Rx PCN: [id number] Relationship Code: 1 Person Code: 01  Patient ID (MRN): [id number]      Patient Name: Lauren Allen   Patient DOB: [birth year]   Discharge Date:06/14/2013  Expiration Date: 06/22/2023 (must be filled within 7 days of discharge)   Dear [name] have been approved to have the prescriptions written by your discharging physician filled through our [facility] (Medication Assistance Through [facility]) program. This program allows for a one-time (no refills) 34-day supply of selected medications for a low copay amount.  The copay is $3.00 per prescription. For instance, if you have one prescription, you will pay $3.00; for two prescriptions, you pay $6.00; for three prescriptions, you pay $9.00; and so on. Only certain pharmacies are participating in this program with [facility]. You will need to select one of the pharmacies from the attached lists and take your prescriptions, this letter, and your photo ID to one of the participating pharmacies.  We are excited that you are able to use the [facility] program to get your medications. These prescriptions must be filled within 7 days of hospital discharge or they will no longer be valid for the [facility] program. Should you have any problems with your prescriptions please contact your case management team member at [phone] for [name] or [phone] for [name].  Thank you, [facility]

## 2023-06-12 NOTE — Progress Notes (Incomplete)
 Dalvance patient assistance

## 2023-06-12 NOTE — Progress Notes (Signed)
 [facility]  Date of Admission:  06/10/2023      Total days of antibiotics 2   Vancomycin    ASSESSMENT:  Lauren Allen is a 33 y.o. female admitted with:   C spine Cervical Epidural Abscess / Discitis -  MRSA 01/2023, Incompletely Treated -  Afebrile and no bacteremia noted on day 2 from BCx. Leukocytosis has resolved. Need to get her through 8 weeks of continuous treatment. Plan to administer IV oritavancin 3g tonight overnight (takes 3 hours) then plan to discharge home tomorrow 2/19. She will go home with 4 weeks of linezoid to have in the event we need to start that if there are delays with next infusion appointment. This has been scheduled for 2/26 at [facility].  Also will need MRI follow up outpatient at end of therapy barring she has continued improvement which may be a challenge self pay.  -Follow up TTE -FU Blood cultures to maturity, negative 48h now  -FU with Marcos Eke 2/25 to FU on patient assistance for Dalvance 1 gm has been arranged x 4 doses.  -IF we are unable to secure assistance by 2/26 appt, can start linezolid until we hear back.  -Anticipate D/C 2/19. D/W IMT service.   Medication Monitoring -  Will need weekly CBC with Diff, ESR, CRP, CMP at infusion center    Pain Management -  Per primary team  Uninsured -  Over income for Medicaid from what she tells me. Unable to afford private insurance. Maybe we can help her get set up for [facility] program when we see her next week to help.  Also awaiting patient assistance for cost of medication    All appointments have been added to discharge navigator.    PLAN: Administer Oritavancin  x 1 tonight.  Plan for D/C tomorrow barring BCx still negative  FU TTE results  Dalvance 1 gm weekly 1st dose on 2/26 if patient assistance secured  Please send linezolid 600 mg BID to [facility] pharmacy so she has a 4 week supply available IF we need to  start that.     Principal Problem:   Discitis of cervical region Active Problems:   IDA (iron deficiency anemia)   Opioid use disorder   Cystitis   Osteomyelitis of multiple sites [facility])   Spinal epidural abscess   Acute cystitis without hematuria   MRSA infection    Chlorhexidine Gluconate Cloth  6 each Topical Daily   melatonin  5 mg Oral QHS   methadone  160 mg Oral Daily   And   methadone  5 mg Oral Daily   mupirocin ointment  1 Application Nasal BID   rivaroxaban  10 mg Oral Daily    SUBJECTIVE: She is hopeful to go home tomorrow so her husband does not have to drive in the impending weather.  Unable to stay for supervised IV treatment in the hospital given history of substance use which she understands. She is sole provider for her family of 3 (special needs son). 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 Lives close to [facility]. Lost Medicaid due to over income.   Review of Systems: Review of Systems  Constitutional:  Negative for chills and fever.  Respiratory: Negative.    Cardiovascular: Negative.   Musculoskeletal:  Positive for neck pain.    Allergies  Allergen Reactions   Sulfa Antibiotics  Palpitations    OBJECTIVE: Vitals:   06/11/23 1610 06/11/23 1943 06/12/23 0416 06/12/23 0807  BP: 106/63 123/77 (!) 92/58 117/81  Pulse: 60 70 (!) 56 65  Resp: 17 18 18    Temp: 98.4 F (36.9 C) 99.1 F (37.3 C) 97.6 F (36.4 C) 98 F (36.7 C)  TempSrc: Oral Oral Oral   SpO2: 94% 100% 100% 100%  Weight:      Height:       Body mass index is 37.26 kg/m.  Physical Exam Vitals reviewed.  Constitutional:      Appearance: Normal appearance. She is ill-appearing.  Cardiovascular:     Rate and Rhythm: Normal rate and regular rhythm.  Pulmonary:     Effort: Pulmonary effort is normal.  Abdominal:     General: Bowel sounds are normal. There is no distension.     Palpations: Abdomen is soft.   Musculoskeletal:     Cervical back: Pain with movement present. Decreased range of motion.  Skin:    General: Skin is warm and dry.     Findings: Rash present.  Neurological:     Mental Status: She is alert.     Lab Results Lab Results  Component Value Date   WBC 9.7 06/12/2023   HGB 7.7 (L) 06/12/2023   HCT 27.9 (L) 06/12/2023   MCV 61.3 (L) 06/12/2023   PLT 376 06/12/2023    Lab Results  Component Value Date   CREATININE 0.65 06/12/2023   BUN 12 06/12/2023   NA 138 06/12/2023   K 4.1 06/12/2023   CL 99 06/12/2023   CO2 27 06/12/2023    Lab Results  Component Value Date   ALT 20 06/10/2023   AST 14 (L) 06/10/2023   ALKPHOS 129 (H) 06/10/2023   BILITOT 0.6 06/10/2023     Microbiology: Recent Results (from the past 240 hours)  Blood culture (routine x 2)     Status: None (Preliminary result)   Collection Time: 06/10/23  8:20 AM   Specimen: BLOOD RIGHT FOREARM  Result Value Ref Range Status   Specimen Description BLOOD RIGHT FOREARM  Final   Special Requests   Final    BOTTLES DRAWN AEROBIC AND ANAEROBIC Blood Culture results may not be optimal due to an inadequate volume of blood received in culture bottles   Culture   Final    NO GROWTH 2 DAYS Performed at [facility], [address], [place], [place] [address]    Report Status PENDING  Incomplete  Blood culture (routine x 2)     Status: None (Preliminary result)   Collection Time: 06/10/23  6:17 PM   Specimen: BLOOD RIGHT HAND  Result Value Ref Range Status   Specimen Description BLOOD RIGHT HAND  Final   Special Requests   Final    BOTTLES DRAWN AEROBIC AND ANAEROBIC Blood Culture results may not be optimal due to an inadequate volume of blood received in culture bottles   Culture   Final    NO GROWTH 2 DAYS Performed at [facility], [address] [place], [place] [address]    Report Status PENDING  Incomplete  MRSA Next Gen by PCR, Nasal     Status: Abnormal   Collection Time:  06/11/23  9:02 PM   Specimen: Nasal Mucosa; Nasal Swab  Result Value Ref Range Status   MRSA by PCR Next Gen DETECTED (A) NOT DETECTED Final    Comment: RESULT CALLED TO, READ BACK BY AND VERIFIED WITH:  D JOSEPH,RN@[id number]  06/11/23 MK (NOTE) The GeneXpert MRSA Assay (FDA approved for NASAL specimens only), is one component of a comprehensive MRSA colonization surveillance program. It is not intended to diagnose MRSA infection nor to guide or monitor treatment for MRSA infections. Test performance is not FDA approved in patients less than 29 years old. Performed at [facility], [address] [place], [place] [address]      Rexene Alberts, MSN, NP-C [facility]  [email or web address] Pager: [phone] Office: [phone] [facility] Main Line: [phone] *Secure Chat Communication Welcome

## 2023-06-13 ENCOUNTER — Inpatient Hospital Stay (HOSPITAL_COMMUNITY): Payer: MEDICAID

## 2023-06-13 ENCOUNTER — Other Ambulatory Visit (HOSPITAL_COMMUNITY): Payer: Self-pay

## 2023-06-13 DIAGNOSIS — R7881 Bacteremia: Secondary | ICD-10-CM

## 2023-06-13 LAB — CBC WITH DIFFERENTIAL/PLATELET
Abs Immature Granulocytes: 0.06 10*3/uL (ref 0.00–0.07)
Basophils Absolute: 0 10*3/uL (ref 0.0–0.1)
Basophils Relative: 0 %
Eosinophils Absolute: 0.1 10*3/uL (ref 0.0–0.5)
Eosinophils Relative: 2 %
HCT: 27.3 % — ABNORMAL LOW (ref 36.0–46.0)
Hemoglobin: 7.5 g/dL — ABNORMAL LOW (ref 12.0–15.0)
Immature Granulocytes: 1 %
Lymphocytes Relative: 28 %
Lymphs Abs: 2.6 10*3/uL (ref 0.7–4.0)
MCH: 16.9 pg — ABNORMAL LOW (ref 26.0–34.0)
MCHC: 27.5 g/dL — ABNORMAL LOW (ref 30.0–36.0)
MCV: 61.3 fL — ABNORMAL LOW (ref 80.0–100.0)
Monocytes Absolute: 0.7 10*3/uL (ref 0.1–1.0)
Monocytes Relative: 7 %
Neutro Abs: 5.8 10*3/uL (ref 1.7–7.7)
Neutrophils Relative %: 62 %
Platelets: 378 10*3/uL (ref 150–400)
RBC: 4.45 MIL/uL (ref 3.87–5.11)
RDW: 21.1 % — ABNORMAL HIGH (ref 11.5–15.5)
WBC: 9.3 10*3/uL (ref 4.0–10.5)
nRBC: 0 % (ref 0.0–0.2)

## 2023-06-13 LAB — ECHOCARDIOGRAM COMPLETE
AR max vel: 2.64 cm2
AV Peak grad: 9.7 mm[Hg]
Ao pk vel: 1.56 m/s
Area-P 1/2: 4.63 cm2
Height: 63 in
S' Lateral: 3 cm
Weight: 3365.1 [oz_av]

## 2023-06-13 MED ORDER — LINEZOLID 600 MG PO TABS
600.0000 mg | ORAL_TABLET | Freq: Two times a day (BID) | ORAL | 0 refills | Status: DC
  Filled 2023-06-13: qty 56, 28d supply, fill #0

## 2023-06-13 MED ORDER — OXYCODONE HCL 5 MG PO TABS
5.0000 mg | ORAL_TABLET | Freq: Four times a day (QID) | ORAL | 0 refills | Status: DC | PRN
  Filled 2023-06-13: qty 6, 2d supply, fill #0

## 2023-06-13 NOTE — Discharge Summary (Signed)
 Name: Lauren Allen MRN: [id number] DOB: 02/01/1991 33 y.o. PCP: Morrie Sheldon, MD  Date of Admission: 06/10/2023 12:32 AM Date of Discharge:  06/13/23 Attending Physician: Dr.  Lafonda Mosses  DISCHARGE DIAGNOSIS:  Primary Problem: Discitis of cervical region   Hospital Problems: Principal Problem:   Discitis of cervical region Active Problems:   IDA (iron deficiency anemia)   Opioid use disorder   Cystitis   Osteomyelitis of multiple sites [facility])   Spinal epidural abscess   Acute cystitis without hematuria   MRSA infection    DISCHARGE MEDICATIONS:   Allergies as of 06/13/2023       Reactions   Sulfa Antibiotics Palpitations        Medication List     TAKE these medications    ferrous sulfate 325 (65 FE) MG EC tablet Take 1 tablet (325 mg total) by mouth every other day.   linezolid 600 MG tablet Commonly known as: ZYVOX Take 1 tablet (600 mg total) by mouth 2 (two) times daily. Start taking on 06/20/23 or when directed to start by your ID physician Start taking on: June 19, 2023   melatonin 5 MG Tabs Take 1 tablet (5 mg total) by mouth at bedtime.   methadone 10 MG/ML solution Commonly known as: DOLOPHINE Take 165 mg by mouth daily.   naloxone 0.4 MG/ML injection Commonly known as: NARCAN Inject 1 mL (0.4 mg total) into the vein as needed for up to 1 dose (If pt is unresponsive or if RR<8).   naloxone 4 MG/0.1ML Liqd nasal spray kit Commonly known as: NARCAN Place 1 spray into the nose as needed,   oxyCODONE 5 MG immediate release tablet Commonly known as: Oxy IR/ROXICODONE Take 1 tablet (5 mg total) by mouth every 6 (six) hours as needed for up to 6 doses for severe pain (pain score 7-10).        DISPOSITION AND FOLLOW-UP:  [name] was discharged from [facility] in Stable condition. At the hospital follow up visit please address:  Follow-up Recommendations: Consults: ID Labs:  CBC with diff Studies: None     Medications: Dalbavancin   Follow-up Appointments:  Follow-up Information     [facility] Follow up on 06/20/2023.   Why: 12:00 apointment - please arrive 20 minutes early and check in on 2nd floor Admissions at Entrance A off [address] for your appointemnt. Can use Valet Parking if needed. Contact information: [address] [place] [address] [phone]        [facility] [place] Follow up on 06/19/2023.   Specialty: Infectious Diseases Why: 2:00 pm appointment with Marcos Eke, NP - this is to ensure all patient assistance is set up for your infusion on 2/26. Please DO NOT start the antibiotic pills before this appointment. Contact information: [address] [place], [address] [place] [address] [phone]                HOSPITAL COURSE:  Patient Summary: #C3-C7 epidural abscess #Discitis low C4/C5, C5/C6 Presented with 2-day history of neck pain.  Previously discharged within the linezolid, but did not complete course.  CRP and ESR elevated.  MRI notable for destructive changes at C4-C5 and C5-C6 with epidural abscess from C3-C7.  Neurosurgery consulted who had no indication for surgery.  IR also consulted who indicated they cannot perform a biopsy of a cervical abscess.  ID  consulted. Blood culture negative throughout stay. Placed on Vanco monotherapy.  Transitioned to weekly dalbavancin and to be followed by ID.  Will be discharged with 4-week course of linezolid in case patient is unable to follow-up with weekly IV antibiotic treatment.  Patient told in understanding that she is not to take this medication unless directed to by her physician.  Will also be given 3-day course of oxycodone for pain relief. TTE unremarkable. Stable at discharge  #Simple cystitis Found to have dysuria, cloudy area, and increased urinary frequency for 5 days.  Given  dose of fosfomycin.  Stable at discharge  #Iron deficiency anemia Stable.  Noted to have iron deficiency in October 2024.  Iron supplementation held due to active infection.   DISCHARGE INSTRUCTIONS:   Discharge Instructions     Call MD for:  difficulty breathing, headache or visual disturbances   Complete by: As directed    Call MD for:  extreme fatigue   Complete by: As directed    Call MD for:  hives   Complete by: As directed    Call MD for:  persistant dizziness or light-headedness   Complete by: As directed    Call MD for:  persistant nausea and vomiting   Complete by: As directed    Call MD for:  redness, tenderness, or signs of infection (pain, swelling, redness, odor or green/yellow discharge around incision site)   Complete by: As directed    Call MD for:  severe uncontrolled pain   Complete by: As directed    Call MD for:  temperature >100.4   Complete by: As directed    Diet - low sodium heart healthy   Complete by: As directed    Discharge instructions   Complete by: As directed    Ms. Lauren Allen, Lauren Allen were hospitalized for neck pain that was likely caused by your abscess.  You were treated with antibiotics.  We consulted infectious disease and neurosurgery, and at this time, we feel that you can be treated safely with antibiotics and are safe for discharge.  For your pain, we have given you a 3-day supply of oxycodone.  You can take this 2 times per day as needed for your pain.  We have also given you a 4-week supply of linezolid.  Please *DO NOT* take this medication unless instructed to by your infectious disease physician.  We have given you a 4-week supply as a backup plan in case you are unable to receive your antibiotic injections.  You will follow-up with infectious disease to receive antibiotic injections weekly until your infection has resolved.  You have a follow-up with infectious disease scheduled on 06/19/23 at 2 PM.  You also have a follow-up with [facility]  scheduled for 06/19/23 at 915 AM.  If you have any questions, please contact us at [phone].  We are glad that you are feeling better!   Increase activity slowly   Complete by: As directed        SUBJECTIVE:  Patient was evaluated at bedside.  Neck pain improved but endorsed as 7 out of 10 without any medications.  With new medications, it is roughly 3 out of 10.  Denies any fever, chills, night sweats, or any other signs or symptoms.  Counseled on antibiotic treatment plan postdischarge.  No other concerns Discharge Vitals:   BP 137/86   Pulse 72   Temp 97.7 F (36.5 C) (Oral)   Resp 18   Ht 5\' 3"  (1.6 m)  Wt 95.4 kg   LMP 05/27/2023   SpO2 100%   BMI 37.26 kg/m   OBJECTIVE:  Physical Exam Constitutional:      Appearance: Normal appearance.  HENT:     Head: Normocephalic and atraumatic.  Cardiovascular:     Rate and Rhythm: Normal rate and regular rhythm.  Pulmonary:     Effort: Pulmonary effort is normal.     Breath sounds: Normal breath sounds.  Neurological:     General: No focal deficit present.     Mental Status: She is alert.  Psychiatric:        Mood and Affect: Mood normal.        Behavior: Behavior normal.     Pertinent Labs, Studies, and Procedures:     Latest Ref Rng & Units 06/13/2023    4:15 AM 06/12/2023    6:10 AM 06/11/2023    4:52 AM  CBC  WBC 4.0 - 10.5 K/uL 9.3  9.7  12.3   Hemoglobin 12.0 - 15.0 g/dL 7.5  7.7  7.7   Hematocrit 36.0 - 46.0 % 27.3  27.9  27.5   Platelets 150 - 400 K/uL 378  376  388        Latest Ref Rng & Units 06/12/2023    6:10 AM 06/11/2023    4:52 AM 06/10/2023   12:59 AM  CMP  Glucose 70 - 99 mg/dL 94  161  096   BUN 6 - 20 mg/dL 12  10  8    Creatinine 0.44 - 1.00 mg/dL 0.45  4.09  8.11   Sodium 135 - 145 mmol/L 138  135  134   Potassium 3.5 - 5.1 mmol/L 4.1  4.1  4.0   Chloride 98 - 111 mmol/L 99  99  97   CO2 22 - 32 mmol/L 27  24  24    Calcium 8.9 - 10.3 mg/dL 9.3  8.7  9.5   Total Protein 6.5 - 8.1 g/dL    8.1   Total Bilirubin 0.0 - 1.2 mg/dL   0.6   Alkaline Phos 38 - 126 U/L   129   AST 15 - 41 U/L   14   ALT 0 - 44 U/L   20     MR Lumbar Spine W Wo Contrast Result Date: 06/10/2023 CLINICAL DATA:  Back pain.  History of spine infection EXAM: MRI LUMBAR SPINE WITHOUT AND WITH CONTRAST TECHNIQUE: Multiplanar and multiecho pulse sequences of the lumbar spine were obtained without and with intravenous contrast. CONTRAST:  10mL GADAVIST GADOBUTROL 1 MMOL/ML IV SOLN COMPARISON:  None Available. FINDINGS: Segmentation:  Standard. Alignment:  Physiologic. Vertebrae:  No fracture, evidence of discitis, or bone lesion. Conus medullaris and cauda equina: Conus extends to the L1 level. Conus and cauda equina appear normal. No fluid collections within the epidural space of the lumbar spine. Paraspinal and other soft tissues: No paraspinal inflammatory changes. Disc levels: Mild disc desiccation at L5-S1 with mild left foraminal disc bulge contributing to mild left foraminal stenosis. No canal stenosis. Remaining lumbar intervertebral disc levels are otherwise normal without foraminal or canal stenosis. IMPRESSION: 1. No acute abnormality of the lumbar spine. No evidence of lumbar discitis-osteomyelitis or epidural abscess. 2. Mild left foraminal stenosis at L5-S1. Electronically Signed   By: Duanne Guess D.O.   On: 06/10/2023 18:13   MR Cervical Spine W and Wo Contrast Result Date: 06/10/2023 CLINICAL DATA:  Cervical discitis-osteomyelitis EXAM: MRI CERVICAL SPINE WITHOUT AND WITH CONTRAST TECHNIQUE: Multiplanar and  multiecho pulse sequences of the cervical spine, to include the craniocervical junction and cervicothoracic junction, were obtained without and with intravenous contrast. CONTRAST:  10mL GADAVIST GADOBUTROL 1 MMOL/ML IV SOLN COMPARISON:  02/07/2023, 02/12/2023 FINDINGS: Alignment: New focal kyphosis of the cervical cord centered at the C5-C6 level. Vertebrae: Findings of ongoing discitis-osteomyelitis  of C5-C6 with destructive endplate changes and partial collapse of the C6 superior endplate contributing to new focal kyphosis at this level. Marrow edema and confluent low T1 signal changes within both vertebral bodies. Small amount of enhancing fluid is now present within the C4-C5 disc with mild marrow edema throughout the C4 vertebral body. Small bilateral facet joint effusions at C5 and C6 as well as trace bilateral C6-7 facet effusions. Cord: Circumferential epidural abscess centered at C5-6 but spanning between the C3 and C7 levels with effacement of the thecal sac. Mild cord compression spanning between the C4-C5 and C6-C7 levels. No appreciable cord edema. Posterior Fossa, vertebral arteries, paraspinal tissues: Marked progression of prevertebral inflammatory changes with a large amount of enhancing phlegmonous material in the prevertebral, retropharyngeal, and pharyngeal spaces. Disc levels: Collapse of the C5-C6 disc space. Elsewhere, no significant degenerative findings. IMPRESSION: 1. Findings of ongoing/worsening discitis-osteomyelitis of C5-C6 with destructive endplate changes and partial collapse of the C6 superior endplate contributing to new focal kyphosis at this level. 2. New findings of discitis-osteomyelitis at C4-C5. 3. Circumferential epidural abscess spanning between the C3 and C7 levels with effacement of the thecal sac. Mild cord compression spanning between the C4-C5 and C6-C7 levels. No appreciable cord edema. Neurosurgical consultation is recommended. 4. Marked progression of prevertebral inflammatory changes with a large amount of enhancing phlegmonous material in the prevertebral, retropharyngeal, and pharyngeal spaces. Electronically Signed   By: Duanne Guess D.O.   On: 06/10/2023 18:11   MR THORACIC SPINE W WO CONTRAST Result Date: 06/10/2023 CLINICAL DATA:  Mid back pain.  History of epidural abscess EXAM: MRI THORACIC WITHOUT AND WITH CONTRAST TECHNIQUE: Multiplanar and  multiecho pulse sequences of the thoracic spine were obtained without and with intravenous contrast. CONTRAST:  10mL GADAVIST GADOBUTROL 1 MMOL/ML IV SOLN COMPARISON:  MRI 02/07/2023 FINDINGS: Alignment:  Physiologic. Vertebrae: No fracture, evidence of discitis, or bone lesion. Cord: Normal signal and morphology. No thoracic epidural space collection. Paraspinal and other soft tissues: Trace bilateral pleural effusions. Disc levels: Tiny shallow noncompressive disc protrusions at T3-T4 and T11-T12. Remaining discs are normal. No facet arthropathy. No foraminal or canal stenosis at any level. IMPRESSION: 1. No acute abnormality of the thoracic spine. No evidence of thoracic discitis-osteomyelitis or epidural abscess. 2. Trace bilateral pleural effusions. Electronically Signed   By: Duanne Guess D.O.   On: 06/10/2023 18:00     Signed: Morrie Sheldon, MD Internal Medicine Resident, PGY-1 [facility]  Pager: [phone]

## 2023-06-13 NOTE — Discharge Instructions (Signed)
 Ms. Lauren Allen, Lauren Allen were hospitalized for neck pain that was likely caused by your abscess.  You were treated with antibiotics.  We consulted infectious disease and neurosurgery, and at this time, we feel that you can be treated safely with antibiotics and are safe for discharge.  For your pain, we have given you a 3-day supply of oxycodone.  You can take this 2 times per day as needed for your pain.  We have also given you a 4-week supply of linezolid.  Please *DO NOT* take this medication unless instructed to by your infectious disease physician.  We have given you a 4-week supply as a backup plan in case you are unable to receive your antibiotic injections.  You will follow-up with infectious disease to receive antibiotic injections weekly until your infection has resolved.  You have a follow-up with infectious disease scheduled on 06/19/23 at 2 PM.  You also have a follow-up with [facility] scheduled for 06/19/23 at 915 AM.  If you have any questions, please contact us at [phone].  We are glad that you are feeling better!

## 2023-06-13 NOTE — Progress Notes (Signed)
 Echocardiogram 2D Echocardiogram has been performed.  Lauren Allen 06/13/2023, 10:13 AM

## 2023-06-15 LAB — CULTURE, BLOOD (ROUTINE X 2)
Culture: NO GROWTH
Culture: NO GROWTH

## 2023-06-19 ENCOUNTER — Ambulatory Visit (INDEPENDENT_AMBULATORY_CARE_PROVIDER_SITE_OTHER): Payer: Self-pay | Admitting: Family

## 2023-06-19 ENCOUNTER — Other Ambulatory Visit (HOSPITAL_COMMUNITY): Payer: Self-pay | Admitting: *Deleted

## 2023-06-19 ENCOUNTER — Ambulatory Visit: Payer: Self-pay | Admitting: Student

## 2023-06-19 ENCOUNTER — Other Ambulatory Visit: Payer: Self-pay

## 2023-06-19 ENCOUNTER — Encounter: Payer: Self-pay | Admitting: Family

## 2023-06-19 VITALS — BP 125/80 | HR 81 | Ht 63.0 in | Wt 210.3 lb

## 2023-06-19 VITALS — BP 118/80 | HR 79 | Temp 98.7°F | Wt 207.8 lb

## 2023-06-19 DIAGNOSIS — G061 Intraspinal abscess and granuloma: Secondary | ICD-10-CM

## 2023-06-19 DIAGNOSIS — Z23 Encounter for immunization: Secondary | ICD-10-CM

## 2023-06-19 DIAGNOSIS — M4642 Discitis, unspecified, cervical region: Secondary | ICD-10-CM

## 2023-06-19 DIAGNOSIS — G062 Extradural and subdural abscess, unspecified: Secondary | ICD-10-CM

## 2023-06-19 DIAGNOSIS — F411 Generalized anxiety disorder: Secondary | ICD-10-CM

## 2023-06-19 DIAGNOSIS — F419 Anxiety disorder, unspecified: Secondary | ICD-10-CM

## 2023-06-19 NOTE — Assessment & Plan Note (Addendum)
 32yo woman with prior substance use disorder (no IVDU since 10/2022, on methadone), who was hospitalized in 01/2023 with an epidural abscess of C4-C7 2/2 MRSA bacteremia.  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  She was given oritavancin in the hospital and has plans for dalbavancin infusions starting tomorrow to do weekly for 3 to 4 weeks.  Since discharge in the hospital she has been doing well without any new or worsening fevers, chills, neck pain, radicular symptoms, or other associated symptoms.  She does have some mild neck pain that is worse in the morning and has gotten better after she got a new pillow.  She is also looking for a new mattress.  Overall she is doing well without any signs of worsening infection and has a good plan for antibiotic infusions with infectious disease.  She has not been taking linezolid. - Continue with dalbavancin infusions per infectious disease. - As needed Tylenol, topical NSAID, and ibuprofen/naproxen dosing discussed - Return precautions discussed

## 2023-06-19 NOTE — Patient Instructions (Addendum)
  Thank you, Ms.Gelisa Odessa Fleming, for allowing [place] to provide your care today.  I am glad everything has been going better since leaving the hospital and that you have a good plan to get the antibiotic infusions with infectious disease.  For the pain that you have especially in the morning you can take Tylenol 1000 mg every 8 hours as needed with a maximum dose of Tylenol being 3000 mg daily, this is a very safe dose to take even if you take the maximum every day.  If the Tylenol is not working you can try something like Voltaren gel over the area which is a topical NSAID or you can try ibuprofen or naproxen which is Advil and Aleve.  I would recommend ibuprofen 400 mg every 6 hours as needed or Aleve 500 mg every 12 hours as needed.  If you are taking either of these medications every day I would recommend that you pick up a medication like omeprazole which is Prilosec or famotidine which is Pepcid which can reduce your risk of stomach ulcer but I would also like you to let us know if you are having to take this every day.   Follow up: 3-4 months    Remember:     Should you have any questions or concerns please call the internal medicine clinic at [phone].     Rocky Morel, DO [facility]

## 2023-06-19 NOTE — Progress Notes (Unsigned)
 Subjective:    Patient ID: Lauren Allen, female    DOB: 1990-07-25, 33 y.o.   MRN: [id number]  No chief complaint on file.   HPI:  Lauren Allen is a 33 y.o. female    Allergies  Allergen Reactions   Sulfa Antibiotics Palpitations      Outpatient Medications Prior to Visit  Medication Sig Dispense Refill   ferrous sulfate 325 (65 FE) MG EC tablet Take 1 tablet (325 mg total) by mouth every other day. 60 tablet 0   hydrOXYzine (ATARAX) 25 MG tablet Take 25-50 mg by mouth 3 (three) times daily as needed.     melatonin 5 MG TABS Take 1 tablet (5 mg total) by mouth at bedtime. 30 tablet 0   methadone (DOLOPHINE) 10 MG/ML solution Take 165 mg by mouth daily.     naloxone (NARCAN) nasal spray 4 mg/0.1 mL Place 1 spray into the nose as needed, 2 each 0   linezolid (ZYVOX) 600 MG tablet Take 1 tablet (600 mg total) by mouth 2 (two) times daily. Start taking on 06/20/23 or when directed to start by your ID physician (Patient not taking: Reported on 06/19/2023) 56 tablet 0   naloxone (NARCAN) 0.4 MG/ML injection Inject 1 mL (0.4 mg total) into the vein as needed for up to 1 dose (If pt is unresponsive or if RR<8). (Patient not taking: Reported on 06/19/2023) 1 mL 0   oxyCODONE (OXY IR/ROXICODONE) 5 MG immediate release tablet Take 1 tablet (5 mg total) by mouth every 6 (six) hours as needed for up to 6 doses for severe pain (pain score 7-10). (Patient not taking: Reported on 06/19/2023) 6 tablet 0   No facility-administered medications prior to visit.     Past Medical History:  Diagnosis Date   Anxiety    Crohn's disease (HCC)    Depression    Headache(784.0)    Ileitis    Opiate addiction (HCC)      Past Surgical History:  Procedure Laterality Date   COLONOSCOPY     HAND SURGERY Left    I & D EXTREMITY Left 11/06/2016   Procedure: IRRIGATION AND DEBRIDEMENT LEFT HAND;  Surgeon: Bradly Bienenstock, MD;  Location: [facility] OR;  Service: Orthopedics;  Laterality: Left;    I & D EXTREMITY Left 11/08/2016   Procedure: IRRIGATION AND DEBRIDEMENT LEFT HAND;  Surgeon: Bradly Bienenstock, MD;  Location: [facility] OR;  Service: Orthopedics;  Laterality: Left;   I & D EXTREMITY Left 10/11/2022   Procedure: IRRIGATION AND DEBRIDEMENT OF ARM;  Surgeon: Nadara Mustard, MD;  Location: [facility] OR;  Service: Orthopedics;  Laterality: Left;   I & D EXTREMITY Left 10/13/2022   Procedure: DEBRIDEMENT LEFT FOREARM;  Surgeon: Nadara Mustard, MD;  Location: [facility] OR;  Service: Orthopedics;  Laterality: Left;   IR FLUORO GUIDE CV LINE RIGHT  02/08/2023   IR REMOVAL TUN CV CATH W/O FL  02/13/2023   IR US GUIDE VASC ACCESS RIGHT  02/08/2023   TONSILLECTOMY         Review of Systems    Objective:    Wt 207 lb 12.8 oz (94.3 kg)   LMP 05/27/2023   BMI 36.81 kg/m  Nursing note and vital signs reviewed.  Physical Exam      03/02/2023    9:09 AM  Depression screen PHQ 2/9  Decreased Interest 0  Down, Depressed, Hopeless 0  PHQ - 2 Score 0       Assessment & Plan:  Patient Active Problem List   Diagnosis Date Noted   MRSA infection 06/12/2023   Osteomyelitis of multiple sites (HCC) 06/11/2023   Spinal epidural abscess 06/11/2023   Acute cystitis without hematuria 06/11/2023   Discitis of cervical region 06/10/2023   Opioid use disorder 06/10/2023   Cystitis 06/10/2023   IDA (iron deficiency anemia) 03/03/2023   Sleep-related hypoxia 03/03/2023   MRSA bacteremia 02/09/2023   Epidural abscess 02/07/2023   Arm wound 10/11/2022   Infection of forearm 10/10/2022   SVD (spontaneous vaginal delivery) 12/04/2016   Postpartum care following vaginal delivery 12/04/2016   Term pregnancy 12/03/2016   Opioid abuse (HCC) 11/06/2016   Abscess of left hand 11/06/2016   Pregnancy and infectious disease, third trimester 11/06/2016   Post-operative state 11/06/2016   Heroin abuse (HCC) 09/13/2016   ANXIETY 06/30/2009   DEPRESSION 06/30/2009     Problem List Items Addressed This Visit    None    I am having Lauren Allen maintain her methadone, melatonin, naloxone, naloxone, ferrous sulfate, oxyCODONE, linezolid, and hydrOXYzine.   No orders of the defined types were placed in this encounter.    Follow-up: No follow-ups on file. or sooner if needed.   Marcos Eke, MSN, FNP-C Nurse Practitioner [facility] for Infectious Disease [facility] Main number: [phone]

## 2023-06-19 NOTE — Assessment & Plan Note (Signed)
 Patient has concerns of intermittent episodic anxiety not consistent with panic attacks that was mostly fueled by the Department of Social Services investigation into a family event that involved her child.  Since the conclusion of this investigation her anxiety has improved significantly but she did discuss this with a provider at the methadone clinic to prescribed as needed hydroxyzine.  We discussed the risks, benefits, and alternatives including buspirone and SSRI/SNRI. - Continue with hydroxyzine 25 mg 3 times daily as needed

## 2023-06-19 NOTE — Addendum Note (Signed)
 Addended by: Maura Crandall on: 06/19/2023 04:50 PM   Modules accepted: Orders

## 2023-06-19 NOTE — Progress Notes (Signed)
   CC: Hospital Follow Up after admission from 06/10/2023 to 06/13/2023 for cervical epidural abscess  HPI:  Lauren Allen is a 33 y.o. female with pertinent PMH of OUD w/ prior IVDU, anxiety/depression, and previous epidural abscess who presents as above. Please see assessment and plan below for further details.  Review of Systems:   Pertinent items noted in HPI and/or A&P.  Physical Exam:  Vitals:   06/19/23 0938  BP: 125/80  Pulse: 81  Weight: 210 lb 4.8 oz (95.4 kg)  Height: 5\' 3"  (1.6 m)    Constitutional: Well-appearing adult female. In no acute distress. HEENT: Normocephalic, atraumatic, Sclera non-icteric, PERRL, EOM intact Cardio:Regular rate and rhythm. 2+ bilateral radial pulses. Pulm:Clear to auscultation bilaterally. Normal work of breathing on room air. Abdomen: Soft, non-tender, non-distended, positive bowel sounds. ZOX:WRUEAVWU for extremity edema. Skin:Warm and dry.  Multiple healing scars on her upper extremity, primarily the forearms without signs of infection Neuro:Alert and oriented x3. No focal deficit noted. Psych:Pleasant mood and affect.   Assessment & Plan:   Epidural abscess 32yo woman with prior substance use disorder (no IVDU since 10/2022, on methadone), who was hospitalized in 01/2023 with an epidural abscess of C4-C7 2/2 MRSA bacteremia.  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  She was given oritavancin in the hospital and has plans for dalbavancin infusions starting tomorrow to do weekly for 3 to 4 weeks.  Since discharge in the hospital she has been doing well without any new or worsening fevers, chills, neck pain, radicular symptoms, or other associated symptoms.  She does have some mild neck pain that is worse in the morning and has gotten better after she got a new pillow.  She is also looking for  a new mattress.  Overall she is doing well without any signs of worsening infection and has a good plan for antibiotic infusions with infectious disease.  She has not been taking linezolid. - Continue with dalbavancin infusions per infectious disease. - As needed Tylenol, topical NSAID, and ibuprofen/naproxen dosing discussed - Return precautions discussed  ANXIETY Patient has concerns of intermittent episodic anxiety not consistent with panic attacks that was mostly fueled by the Department of Social Services investigation into a family event that involved her child.  Since the conclusion of this investigation her anxiety has improved significantly but she did discuss this with a provider at the methadone clinic to prescribed as needed hydroxyzine.  We discussed the risks, benefits, and alternatives including buspirone and SSRI/SNRI. - Continue with hydroxyzine 25 mg 3 times daily as needed    Patient discussed with Dr. Duwayne Heck, DO [facility] Internal [facility] Resident PGY-2 Clinic Phone: [phone] Pager: [phone]

## 2023-06-19 NOTE — Patient Instructions (Addendum)
 Nice to see you.  We will check your lab work today.  Continue to take your medication daily as prescribed.  Refills have been sent to the pharmacy.  Plan for follow up in 3 weeks or sooner if needed with lab work on the same day.  Have a great day and stay safe!

## 2023-06-20 ENCOUNTER — Ambulatory Visit (HOSPITAL_COMMUNITY)
Admission: RE | Admit: 2023-06-20 | Discharge: 2023-06-20 | Disposition: A | Discharge status: Home / Self Care | Payer: Self-pay | Source: Ambulatory Visit | Attending: Internal Medicine | Admitting: Internal Medicine

## 2023-06-20 DIAGNOSIS — G061 Intraspinal abscess and granuloma: Secondary | ICD-10-CM | POA: Insufficient documentation

## 2023-06-20 LAB — COMPREHENSIVE METABOLIC PANEL
ALT: 22 U/L (ref 0–44)
AST: 21 U/L (ref 15–41)
Albumin: 3.7 g/dL (ref 3.5–5.0)
Alkaline Phosphatase: 114 U/L (ref 38–126)
Anion gap: 10 (ref 5–15)
BUN: 15 mg/dL (ref 6–20)
CO2: 28 mmol/L (ref 22–32)
Calcium: 9.5 mg/dL (ref 8.9–10.3)
Chloride: 99 mmol/L (ref 98–111)
Creatinine, Ser: 0.72 mg/dL (ref 0.44–1.00)
GFR, Estimated: >60 mL/min (ref >60)
Glucose, Bld: 77 mg/dL (ref 70–99)
Potassium: 4.6 mmol/L (ref 3.5–5.1)
Sodium: 137 mmol/L (ref 135–145)
Total Bilirubin: 0.5 mg/dL (ref 0.0–1.2)
Total Protein: 7.8 g/dL (ref 6.5–8.1)

## 2023-06-20 LAB — CBC WITH DIFFERENTIAL/PLATELET
Abs Immature Granulocytes: 0.04 10*3/uL (ref 0.00–0.07)
Basophils Absolute: 0.1 10*3/uL (ref 0.0–0.1)
Basophils Relative: 1 %
Eosinophils Absolute: 0.2 10*3/uL (ref 0.0–0.5)
Eosinophils Relative: 2 %
HCT: 33.4 % — ABNORMAL LOW (ref 36.0–46.0)
Hemoglobin: 9.2 g/dL — ABNORMAL LOW (ref 12.0–15.0)
Immature Granulocytes: 0 %
Lymphocytes Relative: 35 %
Lymphs Abs: 3.5 10*3/uL (ref 0.7–4.0)
MCH: 17 pg — ABNORMAL LOW (ref 26.0–34.0)
MCHC: 27.5 g/dL — ABNORMAL LOW (ref 30.0–36.0)
MCV: 61.6 fL — ABNORMAL LOW (ref 80.0–100.0)
Monocytes Absolute: 0.4 10*3/uL (ref 0.1–1.0)
Monocytes Relative: 4 %
Neutro Abs: 5.8 10*3/uL (ref 1.7–7.7)
Neutrophils Relative %: 58 %
Platelets: 489 10*3/uL — ABNORMAL HIGH (ref 150–400)
RBC: 5.42 MIL/uL — ABNORMAL HIGH (ref 3.87–5.11)
RDW: 22.6 % — ABNORMAL HIGH (ref 11.5–15.5)
WBC: 9.9 10*3/uL (ref 4.0–10.5)
nRBC: 0 % (ref 0.0–0.2)

## 2023-06-20 LAB — SEDIMENTATION RATE: Sed Rate: 20 mm/h (ref 0–22)

## 2023-06-20 LAB — C-REACTIVE PROTEIN: CRP: 1.2 mg/dL — ABNORMAL HIGH (ref <1.0)

## 2023-06-20 MED ORDER — DEXTROSE 5 % IV SOLN
1000.0000 mg | INTRAVENOUS | Status: DC
  Administered 2023-06-20: 1000 mg via INTRAVENOUS
  Filled 2023-06-20: qty 50

## 2023-06-20 NOTE — Assessment & Plan Note (Addendum)
 Lauren Allen is clinically improved and has good tolerance to oritivancin in the setting of cervical epidural abscess and incompletely treated discitis/osteomyelitis.. Discussed plan of care to continue with infusions weekly starting tomorrow and plan for at least 4 infusions. Continue pain management with OTC with anti-inflammatories and recommend Aleve twice daily as needed in addition to Tylenol for break through pain. Will need follow up MRI to ensure resolution of abscess and has severe level of anxiety about MRI requiring medication. May need to consider scheduling with anesthesia. Plan for follow up with Dr. Drue Second in 3 weeks or sooner if needed.

## 2023-06-20 NOTE — Progress Notes (Signed)
 [facility] Medicine Clinic Attending  Case discussed with the resident at the time of the visit.  We reviewed the resident's history and exam and pertinent patient test results.  I agree with the assessment, diagnosis, and plan of care documented in the resident's note.

## 2023-06-21 ENCOUNTER — Encounter: Payer: Self-pay | Admitting: Student

## 2023-06-23 ENCOUNTER — Telehealth: Payer: Self-pay

## 2023-06-23 ENCOUNTER — Telehealth: Payer: Self-pay | Admitting: Family Medicine

## 2023-06-23 ENCOUNTER — Emergency Department (HOSPITAL_BASED_OUTPATIENT_CLINIC_OR_DEPARTMENT_OTHER)
Admission: EM | Admit: 2023-06-23 | Discharge: 2023-06-23 | Disposition: A | Discharge status: Home / Self Care | Payer: Self-pay | Attending: Emergency Medicine | Admitting: Emergency Medicine

## 2023-06-23 ENCOUNTER — Other Ambulatory Visit: Payer: Self-pay

## 2023-06-23 ENCOUNTER — Encounter (HOSPITAL_BASED_OUTPATIENT_CLINIC_OR_DEPARTMENT_OTHER): Payer: Self-pay | Admitting: Emergency Medicine

## 2023-06-23 DIAGNOSIS — J3489 Other specified disorders of nose and nasal sinuses: Secondary | ICD-10-CM

## 2023-06-23 DIAGNOSIS — R21 Rash and other nonspecific skin eruption: Secondary | ICD-10-CM | POA: Insufficient documentation

## 2023-06-23 HISTORY — DX: Methicillin resistant Staphylococcus aureus infection, unspecified site: A49.02

## 2023-06-23 HISTORY — DX: Extradural and subdural abscess, unspecified: G06.2

## 2023-06-23 MED ORDER — MUPIROCIN 2 % EX OINT
TOPICAL_OINTMENT | Freq: Once | CUTANEOUS | Status: AC
  Filled 2023-06-23: qty 22

## 2023-06-23 NOTE — ED Provider Notes (Signed)
 [place] EMERGENCY DEPARTMENT AT [facility] Provider Note   CSN: [id number] Arrival date & time: 06/23/23  1503     History  No chief complaint on file.   Lorraine Cimmino is a 33 y.o. female with PMHx chron's disease, depression, anxiety and self inflicted wounds complicated by MRSA eventually spreading to create epidural abscess leading to osteomyelitis of cervical spine (2024) currently on weekly Dalvance IV infusions who presents to ED concerned for rash on tip of nose. This rash initially started as a small bump on the tip of her nose that she assumed was a growing pimple 2 days ago. This area seemed better yesterday, but then patient woke up today and the area was very red and much larger than yesterday. Patient stating that she has a MRSA ointment that she applied to this rash.  Denies fever, cough, nausea, vomiting, diarrhea.  Denies any other rash. Denies bug bites.   HPI     Home Medications Prior to Admission medications   Medication Sig Start Date End Date Taking? Authorizing Provider  ferrous sulfate 325 (65 FE) MG EC tablet Take 1 tablet (325 mg total) by mouth every other day. 03/02/23   Morrie Sheldon, MD  hydrOXYzine (ATARAX) 25 MG tablet Take 25-50 mg by mouth 3 (three) times daily as needed. 06/18/23   [provider]  linezolid (ZYVOX) 600 MG tablet Take 1 tablet (600 mg total) by mouth 2 (two) times daily. Start taking on 06/20/23 or when directed to start by your ID physician Patient not taking: Reported on 06/19/2023 06/19/23   Morrie Sheldon, MD  melatonin 5 MG TABS Take 1 tablet (5 mg total) by mouth at bedtime. 02/15/23   Gwenevere Abbot, MD  methadone (DOLOPHINE) 10 MG/ML solution Take 165 mg by mouth daily.    [provider]  naloxone (NARCAN) 0.4 MG/ML injection Inject 1 mL (0.4 mg total) into the vein as needed for up to 1 dose (If pt is unresponsive or if RR<8). Patient not taking: Reported on 06/19/2023 02/15/23   Gwenevere Abbot, MD  naloxone [facility]) nasal spray 4 mg/0.1 mL Place 1 spray into the nose as needed, 02/15/23   Gwenevere Abbot, MD  oxyCODONE (OXY IR/ROXICODONE) 5 MG immediate release tablet Take 1 tablet (5 mg total) by mouth every 6 (six) hours as needed for up to 6 doses for severe pain (pain score 7-10). Patient not taking: Reported on 06/19/2023 06/13/23   Morrie Sheldon, MD      Allergies    Sulfa antibiotics    Review of Systems   Review of Systems  Skin:  Positive for rash.    Physical Exam Updated Vital Signs BP 114/77   Pulse 70   Temp 98 F (36.7 C) (Oral)   Resp 18   LMP 05/27/2023   SpO2 100%  Physical Exam Vitals and nursing note reviewed.  Constitutional:      General: She is not in acute distress.    Appearance: She is not ill-appearing or toxic-appearing.  HENT:     Head: Normocephalic and atraumatic.  Eyes:     General: No scleral icterus.       Right eye: No discharge.        Left eye: No discharge.     Conjunctiva/sclera: Conjunctivae normal.  Cardiovascular:     Rate and Rhythm: Normal rate.  Pulmonary:     Effort: Pulmonary effort is normal.  Abdominal:     General: Abdomen is flat.  Skin:  General: Skin is warm and dry.     Comments: Approx 1-2cm area of scabbing on tip of nose. Negative nikolsky sign. No active bleeding or purulence. No spreading erythema.  No other rash found on rest of body. chronic wounds on BL arms appear to be well healing.   Neurological:     General: No focal deficit present.     Mental Status: She is alert and oriented to person, place, and time. Mental status is at baseline.  Psychiatric:        Mood and Affect: Mood normal.        Behavior: Behavior normal.     ED Results / Procedures / Treatments   Labs (all labs ordered are listed, but only abnormal results are displayed) Labs Reviewed - No data to display  EKG None  Radiology No results found.  Procedures Procedures    Medications Ordered in  ED Medications  mupirocin ointment (BACTROBAN) 2 % (has no administration in time range)    ED Course/ Medical Decision Making/ A&P                                 Medical Decision Making Risk Prescription drug management.   This patient presents to the ED for concern of rash, this involves an extensive number of treatment options, and is a complaint that carries with it a high risk of complications and morbidity.  The differential diagnosis includes irritant contact dermatitis, DRESS, atopic dermatitis, anaphylaxis, SJS/TEN   Co morbidities that complicate the patient evaluation  chron's disease, depression, anxiety and self inflicted wounds complicated by MRSA eventually spreading to create epidural abscess leading to osteomyelitis of cervical spine (2024) currently on weekly Dalvance IV infusions    Additional history obtained:  Last Dalvance IV Infusion 06/20/2023   Problem List / ED Course / Critical interventions / Medication management  Patient presents to ED concern for rash on tip of her nose.  This rash seemed to be resolving well until she woke up this morning.  Patient unsure if she scratched her nose and her sleep.  Denies bug/tick bites.  Patient's history is complicated by the fact that she has a history of cervical spine osteomyelitis on weekly IV Dalvance infusion.  Patient denies any infectious symptoms today.  Rest of physical exam reassuring.  Negative Nikolsky sign.  Patient afebrile with stable vitals. I think that is more likely that this rash could be caused by incidental trauma to the nose vs contact dermatitis from the MRSA ABX cream that she used on her nose earlier yesterday.  However, per Dalvance information guide, rash can appear if medication is administered too quickly.  Patient without symptoms requiring emergent medical admission at this time. Recommended following up with her ID doctor next week.  Patient verbalized understanding of plan. Staffed with  Dr. Eloise Harman who was able to see patient and agrees with plan. I have reviewed the patients home medicines and have made adjustments as needed Patient afebrile with stable vitals.  Answered all questions.  Provided with return precautions.  Discharged in good condition.  Ddx: these are considered less likely due to history of present illness and physical exam -DRESS: patient afebrile, stable vitals  -atopic dermatitis: rash not confined to flexural areas -SJS/TEN: negative Nikolsky sign -[place] Spotted Fever/Lyme Disease: no hx tick bite or fever   Social Determinants of Health:  none  Final Clinical Impression(s) / ED Diagnoses Final diagnoses:  Rash    Rx / DC Orders ED Discharge Orders     None         Dorthy Cooler, [place] 06/23/23 1931    Rondel Baton, MD 06/27/23 [phone]

## 2023-06-23 NOTE — Discharge Instructions (Addendum)
 It was a pleasure caring for you today.  As discussed, please follow-up with your ID doctor this next week.  Seek emergency care if experiencing any new or worsening symptoms.

## 2023-06-23 NOTE — Progress Notes (Signed)
  Because you are on IV antibiotics and the swelling is spreading,  I feel your condition warrants further evaluation and I recommend that you be seen in a face-to-face visit.   NOTE: There will be NO CHARGE for this E-Visit   If you are having a true medical emergency, please call 911.     For an urgent face to face visit, [facility] has multiple urgent care centers for your convenience.  Click the link below for the full list of locations and hours, walk-in wait times, appointment scheduling options and driving directions:  Urgent Care - [place], [place], [place], [place], [place], [place]  [place]     Your MyChart E-visit questionnaire answers were reviewed by a board certified advanced clinical practitioner to complete your personal care plan based on your specific symptoms.    Thank you for using e-Visits.   I have spent 5 minutes in review of e-visit questionnaire, review and updating patient chart, medical decision making and response to patient.   Reed Pandy, PA-C

## 2023-06-23 NOTE — ED Notes (Signed)
 Pt had dalbavancin infusion on we 2/26; red sore came up on nose on Thursday, 2/27.

## 2023-06-23 NOTE — ED Triage Notes (Signed)
 Pt is on longterm antibiotic infusions weekly for epidural abscess that has spread to the cervical vert. On Thursday a sore was noted on end of her nose and she is concerned it may be related.no fevers

## 2023-06-25 NOTE — Telephone Encounter (Signed)
 Received a call from pt who wants to know if she should stop the cream or not?

## 2023-06-27 ENCOUNTER — Encounter: Payer: Self-pay | Admitting: Student

## 2023-06-27 ENCOUNTER — Encounter (HOSPITAL_COMMUNITY)
Admission: RE | Admit: 2023-06-27 | Discharge: 2023-06-27 | Disposition: A | Discharge status: Home / Self Care | Payer: Self-pay | Source: Ambulatory Visit | Attending: Internal Medicine | Admitting: Internal Medicine

## 2023-06-27 DIAGNOSIS — G061 Intraspinal abscess and granuloma: Secondary | ICD-10-CM | POA: Insufficient documentation

## 2023-06-27 LAB — CBC WITH DIFFERENTIAL/PLATELET
Abs Immature Granulocytes: 0 10*3/uL (ref 0.00–0.07)
Basophils Absolute: 0.2 10*3/uL — ABNORMAL HIGH (ref 0.0–0.1)
Basophils Relative: 2 %
Eosinophils Absolute: 0.2 10*3/uL (ref 0.0–0.5)
Eosinophils Relative: 2 %
HCT: 34.5 % — ABNORMAL LOW (ref 36.0–46.0)
Hemoglobin: 9.7 g/dL — ABNORMAL LOW (ref 12.0–15.0)
Lymphocytes Relative: 28 %
Lymphs Abs: 2.5 10*3/uL (ref 0.7–4.0)
MCH: 17.2 pg — ABNORMAL LOW (ref 26.0–34.0)
MCHC: 28.1 g/dL — ABNORMAL LOW (ref 30.0–36.0)
MCV: 61.3 fL — ABNORMAL LOW (ref 80.0–100.0)
Monocytes Absolute: 0.3 10*3/uL (ref 0.1–1.0)
Monocytes Relative: 3 %
Neutro Abs: 5.9 10*3/uL (ref 1.7–7.7)
Neutrophils Relative %: 65 %
Platelets: 445 10*3/uL — ABNORMAL HIGH (ref 150–400)
RBC: 5.63 MIL/uL — ABNORMAL HIGH (ref 3.87–5.11)
RDW: 22.9 % — ABNORMAL HIGH (ref 11.5–15.5)
WBC: 9.1 10*3/uL (ref 4.0–10.5)
nRBC: 0 % (ref 0.0–0.2)
nRBC: 0 /100{WBCs}

## 2023-06-27 LAB — COMPREHENSIVE METABOLIC PANEL
ALT: 33 U/L (ref 0–44)
AST: 24 U/L (ref 15–41)
Albumin: 3.4 g/dL — ABNORMAL LOW (ref 3.5–5.0)
Alkaline Phosphatase: 113 U/L (ref 38–126)
Anion gap: 7 (ref 5–15)
BUN: 10 mg/dL (ref 6–20)
CO2: 25 mmol/L (ref 22–32)
Calcium: 9.3 mg/dL (ref 8.9–10.3)
Chloride: 102 mmol/L (ref 98–111)
Creatinine, Ser: 0.74 mg/dL (ref 0.44–1.00)
GFR, Estimated: >60 mL/min (ref >60)
Glucose, Bld: 85 mg/dL (ref 70–99)
Potassium: 4.3 mmol/L (ref 3.5–5.1)
Sodium: 134 mmol/L — ABNORMAL LOW (ref 135–145)
Total Bilirubin: 0.4 mg/dL (ref 0.0–1.2)
Total Protein: 7.6 g/dL (ref 6.5–8.1)

## 2023-06-27 LAB — C-REACTIVE PROTEIN: CRP: 1.1 mg/dL — ABNORMAL HIGH (ref <1.0)

## 2023-06-27 LAB — SEDIMENTATION RATE: Sed Rate: 16 mm/h (ref 0–22)

## 2023-06-27 MED ORDER — DEXTROSE 5 % IV SOLN
1000.0000 mg | INTRAVENOUS | Status: DC
  Administered 2023-06-27: 1000 mg via INTRAVENOUS
  Filled 2023-06-27: qty 50

## 2023-07-04 ENCOUNTER — Encounter (HOSPITAL_COMMUNITY)
Admission: RE | Admit: 2023-07-04 | Discharge: 2023-07-04 | Disposition: A | Discharge status: Home / Self Care | Payer: Self-pay | Source: Ambulatory Visit | Attending: Infectious Diseases

## 2023-07-04 DIAGNOSIS — G061 Intraspinal abscess and granuloma: Secondary | ICD-10-CM

## 2023-07-04 LAB — COMPREHENSIVE METABOLIC PANEL
ALT: 20 U/L (ref 0–44)
AST: 20 U/L (ref 15–41)
Albumin: 3.6 g/dL (ref 3.5–5.0)
Alkaline Phosphatase: 80 U/L (ref 38–126)
Anion gap: 10 (ref 5–15)
BUN: 11 mg/dL (ref 6–20)
CO2: 27 mmol/L (ref 22–32)
Calcium: 9.5 mg/dL (ref 8.9–10.3)
Chloride: 99 mmol/L (ref 98–111)
Creatinine, Ser: 0.76 mg/dL (ref 0.44–1.00)
GFR, Estimated: >60 mL/min (ref >60)
Glucose, Bld: 103 mg/dL — ABNORMAL HIGH (ref 70–99)
Potassium: 4 mmol/L (ref 3.5–5.1)
Sodium: 136 mmol/L (ref 135–145)
Total Bilirubin: 0.2 mg/dL (ref 0.0–1.2)
Total Protein: 7.7 g/dL (ref 6.5–8.1)

## 2023-07-04 LAB — CBC WITH DIFFERENTIAL/PLATELET
Abs Immature Granulocytes: 0 10*3/uL (ref 0.00–0.07)
Basophils Absolute: 0.1 10*3/uL (ref 0.0–0.1)
Basophils Relative: 1 %
Eosinophils Absolute: 0.3 10*3/uL (ref 0.0–0.5)
Eosinophils Relative: 4 %
HCT: 34.6 % — ABNORMAL LOW (ref 36.0–46.0)
Hemoglobin: 9.7 g/dL — ABNORMAL LOW (ref 12.0–15.0)
Lymphocytes Relative: 30 %
Lymphs Abs: 2.5 10*3/uL (ref 0.7–4.0)
MCH: 17.4 pg — ABNORMAL LOW (ref 26.0–34.0)
MCHC: 28 g/dL — ABNORMAL LOW (ref 30.0–36.0)
MCV: 61.9 fL — ABNORMAL LOW (ref 80.0–100.0)
Monocytes Absolute: 0.2 10*3/uL (ref 0.1–1.0)
Monocytes Relative: 2 %
Neutro Abs: 5.3 10*3/uL (ref 1.7–7.7)
Neutrophils Relative %: 63 %
Platelets: 327 10*3/uL (ref 150–400)
RBC: 5.59 MIL/uL — ABNORMAL HIGH (ref 3.87–5.11)
RDW: 24 % — ABNORMAL HIGH (ref 11.5–15.5)
WBC: 8.4 10*3/uL (ref 4.0–10.5)
nRBC: 0 % (ref 0.0–0.2)
nRBC: 0 /100{WBCs}

## 2023-07-04 LAB — SEDIMENTATION RATE: Sed Rate: 23 mm/h — ABNORMAL HIGH (ref 0–22)

## 2023-07-04 LAB — C-REACTIVE PROTEIN: CRP: 0.6 mg/dL (ref <1.0)

## 2023-07-04 MED ORDER — DEXTROSE 5 % IV SOLN
1000.0000 mg | INTRAVENOUS | Status: DC
  Administered 2023-07-04: 1000 mg via INTRAVENOUS
  Filled 2023-07-04: qty 50

## 2023-07-11 ENCOUNTER — Encounter (HOSPITAL_COMMUNITY)
Admission: RE | Admit: 2023-07-11 | Discharge: 2023-07-11 | Disposition: A | Discharge status: Home / Self Care | Payer: Self-pay | Source: Ambulatory Visit | Attending: Infectious Diseases

## 2023-07-11 DIAGNOSIS — G061 Intraspinal abscess and granuloma: Secondary | ICD-10-CM

## 2023-07-11 LAB — CBC WITH DIFFERENTIAL/PLATELET
Abs Immature Granulocytes: 0 10*3/uL (ref 0.00–0.07)
Basophils Absolute: 0.3 10*3/uL — ABNORMAL HIGH (ref 0.0–0.1)
Basophils Relative: 3 %
Eosinophils Absolute: 0.1 10*3/uL (ref 0.0–0.5)
Eosinophils Relative: 1 %
HCT: 36.4 % (ref 36.0–46.0)
Hemoglobin: 10.2 g/dL — ABNORMAL LOW (ref 12.0–15.0)
Lymphocytes Relative: 27 %
Lymphs Abs: 2.7 10*3/uL (ref 0.7–4.0)
MCH: 17.5 pg — ABNORMAL LOW (ref 26.0–34.0)
MCHC: 28 g/dL — ABNORMAL LOW (ref 30.0–36.0)
MCV: 62.4 fL — ABNORMAL LOW (ref 80.0–100.0)
Monocytes Absolute: 0.2 10*3/uL (ref 0.1–1.0)
Monocytes Relative: 2 %
Neutro Abs: 6.8 10*3/uL (ref 1.7–7.7)
Neutrophils Relative %: 67 %
Platelets: 328 10*3/uL (ref 150–400)
RBC: 5.83 MIL/uL — ABNORMAL HIGH (ref 3.87–5.11)
RDW: 24.9 % — ABNORMAL HIGH (ref 11.5–15.5)
WBC: 10.1 10*3/uL (ref 4.0–10.5)
nRBC: 0 % (ref 0.0–0.2)
nRBC: 0 /100{WBCs}

## 2023-07-11 LAB — COMPREHENSIVE METABOLIC PANEL
ALT: 15 U/L (ref 0–44)
AST: 15 U/L (ref 15–41)
Albumin: 3.3 g/dL — ABNORMAL LOW (ref 3.5–5.0)
Alkaline Phosphatase: 79 U/L (ref 38–126)
Anion gap: 7 (ref 5–15)
BUN: 11 mg/dL (ref 6–20)
CO2: 25 mmol/L (ref 22–32)
Calcium: 8.8 mg/dL — ABNORMAL LOW (ref 8.9–10.3)
Chloride: 101 mmol/L (ref 98–111)
Creatinine, Ser: 0.65 mg/dL (ref 0.44–1.00)
GFR, Estimated: >60 mL/min (ref >60)
Glucose, Bld: 112 mg/dL — ABNORMAL HIGH (ref 70–99)
Potassium: 3.9 mmol/L (ref 3.5–5.1)
Sodium: 133 mmol/L — ABNORMAL LOW (ref 135–145)
Total Bilirubin: 0.4 mg/dL (ref 0.0–1.2)
Total Protein: 6.9 g/dL (ref 6.5–8.1)

## 2023-07-11 LAB — C-REACTIVE PROTEIN: CRP: <0.5 mg/dL (ref <1.0)

## 2023-07-11 LAB — SEDIMENTATION RATE: Sed Rate: 13 mm/h (ref 0–22)

## 2023-07-11 MED ORDER — ONDANSETRON HCL 4 MG/2ML IJ SOLN: 4.0000 mg | Freq: Once | INTRAMUSCULAR | Status: DC | PRN

## 2023-07-11 MED ORDER — DEXTROSE 5 % IV SOLN
1000.0000 mg | INTRAVENOUS | Status: DC
  Administered 2023-07-11: 1000 mg via INTRAVENOUS
  Filled 2023-07-11: qty 50

## 2023-07-13 ENCOUNTER — Ambulatory Visit (INDEPENDENT_AMBULATORY_CARE_PROVIDER_SITE_OTHER): Payer: Self-pay | Admitting: Internal Medicine

## 2023-07-13 ENCOUNTER — Encounter: Payer: Self-pay | Admitting: Internal Medicine

## 2023-07-13 ENCOUNTER — Other Ambulatory Visit: Payer: Self-pay

## 2023-07-13 ENCOUNTER — Other Ambulatory Visit (HOSPITAL_COMMUNITY): Payer: Self-pay

## 2023-07-13 VITALS — BP 121/81 | HR 72 | Temp 98.6°F | Wt 208.2 lb

## 2023-07-13 DIAGNOSIS — D509 Iron deficiency anemia, unspecified: Secondary | ICD-10-CM

## 2023-07-13 DIAGNOSIS — A4902 Methicillin resistant Staphylococcus aureus infection, unspecified site: Secondary | ICD-10-CM

## 2023-07-13 DIAGNOSIS — G062 Extradural and subdural abscess, unspecified: Secondary | ICD-10-CM

## 2023-07-13 DIAGNOSIS — M4640 Discitis, unspecified, site unspecified: Secondary | ICD-10-CM

## 2023-07-13 MED ORDER — DOXYCYCLINE HYCLATE 100 MG PO CAPS
100.0000 mg | ORAL_CAPSULE | Freq: Two times a day (BID) | ORAL | 1 refills | Status: DC
  Filled 2023-07-13 – 2023-08-30 (×2): qty 60, 30d supply, fill #0
  Filled 2023-09-27: qty 60, 30d supply, fill #1

## 2023-07-13 NOTE — Patient Instructions (Signed)
 Start linezolid on 3/26. 1 tab twice a day. Once finished that bottle can transition to doxycycline 100mg  po bid. Take until we see you.

## 2023-07-13 NOTE — Progress Notes (Signed)
 Patient ID: Lauren Allen, female   DOB: Jan 12, 1991, 33 y.o.   MRN: [id number]  HPI 32yo F with OUD on methadone, crohn's disease, hx of mrsa bacteremia and cervical discitis in October but only treated for 4 wk, -partially treated for unclear reasons, providers that presumed she had still been on oral abtx. Had worsening neck pain that required hospitalization in mid February, where imaging showed worsening cervical discitis/OM. AT this time she was unable to receiving prolonged IV abtx at the hospital. Did alternative treatment of 4 weekly dalbavancin infusion, last dose on 3/19. Plus the dose received at the hospital. She had outbreak of tip of nose after 2nd infusion when it was given over 30 minutes. Then she had a smaller outbreak at tip of nose again on 4th infusion when it was given over 45 min. Her last infusion on 3/19 has not had any issues.  Never had previous hx of cold sores   Oct 2024; mrsa bacteremia/cervical discitis Methicillin resistant staphylococcus aureus      MIC    CIPROFLOXACIN >=8 RESISTANT Resistant    CLINDAMYCIN <=0.25 SENS... Sensitive    ERYTHROMYCIN >=8 RESISTANT Resistant    GENTAMICIN <=0.5 SENSI... Sensitive    Inducible Clindamycin NEGATIVE Sensitive    LINEZOLID 2 SENSITIVE Sensitive    OXACILLIN >=4 RESISTANT Resistant    RIFAMPIN <=0.5 SENSI... Sensitive    TETRACYCLINE <=1 SENSITIVE Sensitive    TRIMETH/SULFA >=320 RESIS... Resistant    VANCOMYCIN 1 SENSITIVE Sensitive     Outpatient Encounter Medications as of 07/13/2023  Medication Sig   ferrous sulfate 325 (65 FE) MG EC tablet Take 1 tablet (325 mg total) by mouth every other day.   hydrOXYzine (ATARAX) 25 MG tablet Take 25-50 mg by mouth 3 (three) times daily as needed.   linezolid (ZYVOX) 600 MG tablet Take 1 tablet (600 mg total) by mouth 2 (two) times daily. Start taking on 06/20/23 or when directed to start by your ID physician (Patient not taking: Reported on 06/19/2023)    melatonin 5 MG TABS Take 1 tablet (5 mg total) by mouth at bedtime.   methadone (DOLOPHINE) 10 MG/ML solution Take 165 mg by mouth daily.   naloxone (NARCAN) 0.4 MG/ML injection Inject 1 mL (0.4 mg total) into the vein as needed for up to 1 dose (If pt is unresponsive or if RR<8). (Patient not taking: Reported on 06/19/2023)   naloxone [facility]) nasal spray 4 mg/0.1 mL Place 1 spray into the nose as needed,   oxyCODONE (OXY IR/ROXICODONE) 5 MG immediate release tablet Take 1 tablet (5 mg total) by mouth every 6 (six) hours as needed for up to 6 doses for severe pain (pain score 7-10). (Patient not taking: Reported on 06/19/2023)   No facility-administered encounter medications on file as of 07/13/2023.     Patient Active Problem List   Diagnosis Date Noted   MRSA infection 06/12/2023   Osteomyelitis of multiple sites (HCC) 06/11/2023   Spinal epidural abscess 06/11/2023   Acute cystitis without hematuria 06/11/2023   Discitis of cervical region 06/10/2023   Opioid use disorder 06/10/2023   Cystitis 06/10/2023   IDA (iron deficiency anemia) 03/03/2023   Sleep-related hypoxia 03/03/2023   MRSA bacteremia 02/09/2023   Epidural abscess 02/07/2023   Arm wound 10/11/2022   Infection of forearm 10/10/2022   SVD (spontaneous vaginal delivery) 12/04/2016   Postpartum care following vaginal delivery 12/04/2016   Term pregnancy 12/03/2016   Opioid abuse (HCC) 11/06/2016   Abscess  of left hand 11/06/2016   Pregnancy and infectious disease, third trimester 11/06/2016   Post-operative state 11/06/2016   Heroin abuse (HCC) 09/13/2016   ANXIETY 06/30/2009   DEPRESSION 06/30/2009     Health Maintenance Due  Topic Date Due   Pneumococcal Vaccine 63-64 Years old (1 of 2 - PCV) Never done   DTaP/Tdap/Td (1 - Tdap) Never done   Cervical Cancer Screening (HPV/Pap Cotest)  Never done   INFLUENZA VACCINE  11/23/2022   COVID-19 Vaccine (1 - 2024-25 season) Never done     Review of Systems Less neck  pain. No fevers, no thrush, 12 point ros is negative o Physical Exam   There were no vitals taken for this visit.  Physical Exam  Constitutional:  oriented to person, place, and time. appears well-developed and well-nourished. No distress.  HENT: [place]/AT, PERRLA, no scleral icterus Mouth/Throat: Oropharynx is clear and moist. No oropharyngeal exudate.  Cardiovascular: Normal rate, regular rhythm and normal heart sounds. Exam reveals no gallop and no friction rub.  No murmur heard.  Pulmonary/Chest: Effort normal and breath sounds normal. No respiratory distress.  has no wheezes.  Neck = supple, no nuchal rigidity Abdominal: Soft. Bowel sounds are normal.  exhibits no distension. There is no tenderness.  Ext: scarring and excoriation to forearms bilaterally Neurological: alert and oriented to person, place, and time.  Skin: Skin is warm and dry. No rash noted. No erythema.  Psychiatric: a normal mood and affect.  behavior is normal.   Lab Results  Component Value Date   LABRPR NON REACTIVE 02/14/2023    CBC Lab Results  Component Value Date   WBC 10.1 07/11/2023   RBC 5.83 (H) 07/11/2023   HGB 10.2 (L) 07/11/2023   HCT 36.4 07/11/2023   PLT 328 07/11/2023   MCV 62.4 (L) 07/11/2023   MCH 17.5 (L) 07/11/2023   MCHC 28.0 (L) 07/11/2023   RDW 24.9 (H) 07/11/2023   LYMPHSABS 2.7 07/11/2023   MONOABS 0.2 07/11/2023   EOSABS 0.1 07/11/2023    BMET Lab Results  Component Value Date   NA 133 (L) 07/11/2023   K 3.9 07/11/2023   CL 101 07/11/2023   CO2 25 07/11/2023   GLUCOSE 112 (H) 07/11/2023   BUN 11 07/11/2023   CREATININE 0.65 07/11/2023   CALCIUM 8.8 (L) 07/11/2023   GFRNONAA >60 07/11/2023   GFRAA >60 08/01/2017    Lab Results  Component Value Date   ESRSEDRATE 13 07/11/2023   Lab Results  Component Value Date   CRP <0.5 07/11/2023     Assessment and Plan MRSA discitis/epidural abscess =  Plan to start linezolid on 3/26 Wednesday, once finished will follow  on chronic suppression with doxycycline 100mg  po bid on full stomach - fplan for total of 8 wks or more pending repeat imaging  Microcytic anemia = will do iron studies at next visit  Long term medication management = will check sed rate and crp  We wil see back in 5 wk  Ask if she has medicaid waiver -- for husband to be the caregiver for their kid

## 2023-07-25 ENCOUNTER — Other Ambulatory Visit (HOSPITAL_COMMUNITY): Payer: Self-pay

## 2023-08-27 ENCOUNTER — Ambulatory Visit: Payer: Self-pay | Admitting: Internal Medicine

## 2023-08-30 ENCOUNTER — Encounter: Payer: Self-pay | Admitting: Internal Medicine

## 2023-08-30 ENCOUNTER — Other Ambulatory Visit (HOSPITAL_COMMUNITY): Payer: Self-pay

## 2023-08-30 MED ORDER — NORGESTIMATE-ETH ESTRADIOL 0.25-35 MG-MCG PO TABS
1.0000 | ORAL_TABLET | Freq: Every day | ORAL | 1 refills | Status: AC
  Filled 2023-08-30 (×3): qty 84, 84d supply, fill #0

## 2023-08-30 MED ORDER — FLUCONAZOLE 150 MG PO TABS
150.0000 mg | ORAL_TABLET | Freq: Once | ORAL | 0 refills | Status: AC
  Filled 2023-08-30: qty 2, 3d supply, fill #0

## 2023-08-30 NOTE — Telephone Encounter (Signed)
 Here ya go!

## 2023-09-03 ENCOUNTER — Other Ambulatory Visit (HOSPITAL_COMMUNITY): Payer: Self-pay

## 2023-09-03 ENCOUNTER — Other Ambulatory Visit: Payer: Self-pay | Admitting: Pharmacist

## 2023-09-03 DIAGNOSIS — R110 Nausea: Secondary | ICD-10-CM

## 2023-09-03 MED ORDER — ONDANSETRON HCL 4 MG PO TABS
4.0000 mg | ORAL_TABLET | Freq: Two times a day (BID) | ORAL | 2 refills | Status: DC
  Filled 2023-09-03: qty 20, 10d supply, fill #0

## 2023-09-19 ENCOUNTER — Encounter: Payer: Self-pay | Admitting: Student

## 2023-09-19 ENCOUNTER — Other Ambulatory Visit (HOSPITAL_COMMUNITY): Payer: Self-pay

## 2023-09-19 ENCOUNTER — Ambulatory Visit (INDEPENDENT_AMBULATORY_CARE_PROVIDER_SITE_OTHER): Payer: Self-pay | Admitting: Student

## 2023-09-19 VITALS — BP 117/68 | HR 67 | Temp 98.1°F | Ht 64.0 in | Wt 216.6 lb

## 2023-09-19 DIAGNOSIS — G062 Extradural and subdural abscess, unspecified: Secondary | ICD-10-CM

## 2023-09-19 DIAGNOSIS — D509 Iron deficiency anemia, unspecified: Secondary | ICD-10-CM

## 2023-09-19 MED ORDER — PROMETHAZINE HCL 12.5 MG PO TABS
12.5000 mg | ORAL_TABLET | Freq: Four times a day (QID) | ORAL | 0 refills | Status: AC | PRN
  Filled 2023-09-19: qty 16, 4d supply, fill #0

## 2023-09-19 NOTE — Assessment & Plan Note (Signed)
 She was hospitalized from 2/16 to 2/19 for recurrent cervical epidural abscess with discitis and osteomyelitis. She followed with infectious disease (last seen on 3/21) in which she was to take linezolid  starting on 3/26 followed by doxycycline  100 mg p.o. twice daily for total of 8 weeks with repeat imaging.  She is scheduled to follow-up with infectious disease on 6/4.  An MRI of her cervical spine with and without contrast has been ordered but not scheduled.  She notes today that she is having issues with the antibiotics. They are making her feel nauseous with associated vomiting that has occurred with both the linezolid  and doxycycline . She has completed her linezolid  course but is still taking the doxycycline . She was prescribed Zofran  to help with the nausea but this caused to have palpitations, diaphoresis, and jitters.  She has had success with Phenergan  in the past.  At this time, we will give her a trial of Phenergan  until she is to follow-up with infectious disease next week - Follow-up MR cervical spine with and without contrast - Phenergan  12.5 mg every 6 hours as needed - CRP/ESR - CBC with differential

## 2023-09-19 NOTE — Patient Instructions (Signed)
 Thank you so much for coming to the clinic today!   Please start taking the Phenergan  12.5 mg every six hours as needed for the nausea and vomiting until you follow-up with infectious disease next week.  I will call back regarding your labs.  We will see you in three months.   If you have any questions please feel free to the call the clinic at anytime at [phone]. It was a pleasure seeing you!  Best, Dr. Carolee Churchman

## 2023-09-19 NOTE — Progress Notes (Signed)
 CC: 68-month follow-up for epidural abscess and iron deficiency anemia  HPI:  [name] is a 33 y.o. female living with a history stated below and presents today for 64-month follow-up. Please see problem based assessment and plan for additional details.  Past Medical History:  Diagnosis Date   Anxiety    Crohn's disease (HCC)    Depression    Epidural abscess    Headache(784.0)    Ileitis    MRSA infection    bilateral wounds to forearms, from anxiety (picking)   Opiate addiction (HCC)     Current Outpatient Medications on File Prior to Visit  Medication Sig Dispense Refill   doxycycline  (VIBRAMYCIN ) 100 MG capsule Take 1 capsule (100 mg total) by mouth 2 (two) times daily. Take on full stomach; start after you finish linezolid . 60 capsule 1   ferrous sulfate  325 (65 FE) MG EC tablet Take 1 tablet (325 mg total) by mouth every other day. 60 tablet 0   hydrOXYzine (ATARAX) 25 MG tablet Take 25-50 mg by mouth 3 (three) times daily as needed.     linezolid  (ZYVOX ) 600 MG tablet Take 1 tablet (600 mg total) by mouth 2 (two) times daily. Start taking on 06/20/23 or when directed to start by your ID physician 56 tablet 0   melatonin 5 MG TABS Take 1 tablet (5 mg total) by mouth at bedtime. 30 tablet 0   methadone  (DOLOPHINE ) 10 MG/ML solution Take 165 mg by mouth daily.     naloxone  (NARCAN ) 0.4 MG/ML injection Inject 1 mL (0.4 mg total) into the vein as needed for up to 1 dose (If pt is unresponsive or if RR<8). 1 mL 0   naloxone  (NARCAN ) nasal spray 4 mg/0.1 mL Place 1 spray into the nose as needed, 2 each 0   norgestimate -ethinyl estradiol  (SPRINTEC  28) 0.25-35 MG-MCG tablet Take 1 tablet by mouth daily. 84 tablet 1   ondansetron  (ZOFRAN ) 4 MG tablet Take 1 tablet (4 mg total) by mouth 2 (two) times daily at least 1 hour before taking linezolid . 20 tablet 2   oxyCODONE  (OXY IR/ROXICODONE ) 5 MG immediate release tablet Take 1 tablet (5 mg total) by mouth every 6 (six)  hours as needed for up to 6 doses for severe pain (pain score 7-10). (Patient not taking: Reported on 06/19/2023) 6 tablet 0   No current facility-administered medications on file prior to visit.    Family History  Problem Relation Age of Onset   Colon cancer Maternal Grandfather    Celiac disease Maternal Aunt    Diabetes Maternal Grandmother    Hypothyroidism Maternal Grandmother    Heart disease Unknown    Diabetes Mother    Hypertension Mother    Hypothyroidism Mother    Hypertension Father    Heart disease Father    Hypothyroidism Paternal Grandmother     Social History   Socioeconomic History   Marital status: Married    Spouse name: Not on file   Number of children: Not on file   Years of education: Not on file   Highest education level: Not on file  Occupational History   Not on file  Tobacco Use   Smoking status: Some Days    Current packs/day: 0.00    Types: Cigarettes    Last attempt to quit: 12/01/2016    Years since quitting: 6.8   Smokeless tobacco: Never  Vaping Use   Vaping status: Former  Substance and Sexual Activity   Alcohol use:  Not Currently   Drug use: Not Currently    Comment: heroin clean 6 yrs, minor relapse in june 2024   Sexual activity: Not Currently    Birth control/protection: None  Other Topics Concern   Not on file  Social History Narrative   Has one coke a day   Social Drivers of Health   Financial Resource Strain: Not on file  Food Insecurity: No Food Insecurity (06/10/2023)   Hunger Vital Sign    Worried About Running Out of Food in the Last Year: Never true    Ran Out of Food in the Last Year: Never true  Transportation Needs: No Transportation Needs (06/10/2023)   PRAPARE - Administrator, Civil Service (Medical): No    Lack of Transportation (Non-Medical): No  Physical Activity: Not on file  Stress: Not on file  Social Connections: Not on file  Intimate Partner Violence: Not At Risk (06/10/2023)    Humiliation, Afraid, Rape, and Kick questionnaire    Fear of Current or Ex-Partner: No    Emotionally Abused: No    Physically Abused: No    Sexually Abused: No    Review of Systems: ROS negative except for what is noted on the assessment and plan.  Vitals:   09/19/23 0904  BP: 117/68  Pulse: 67  Temp: 98.1 F (36.7 C)  TempSrc: Oral  SpO2: 97%  Weight: 216 lb 9.6 oz (98.2 kg)  Height: 5\' 4"  (1.626 m)    Physical Exam: Constitutional: well-appearing in no acute distress HENT: normocephalic atraumatic, mucous membranes moist Eyes: conjunctiva non-erythematous Neck: supple Cardiovascular: regular rate and rhythm, no m/r/g Pulmonary/Chest: normal work of breathing on room air, lungs clear to auscultation bilaterally Abdominal: soft, non-tender, non-distended MSK: normal bulk and tone Neurological: alert & oriented x 3, 5/5 strength in bilateral upper and lower extremities, normal gait Skin: warm and dry  Assessment & Plan:   IDA (iron deficiency anemia) CBC on 07/11/2023 notable for hemoglobin of 10.2 with MCV of 62.4.  Of note, she had an iron panel completed and October 2024 notable for an iron of 13 and ferritin of 17.  She has been taking ferrous sulfate  325 mg every other day. She was off of it for about two months but restarted about a month ago. No signs/symptoms today.  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 She followed with infectious disease (last seen on 3/21) in which she was to take linezolid  starting on 3/26 followed by doxycycline  100 mg p.o. twice daily for total of 8 weeks with repeat imaging.  She is scheduled to follow-up with infectious disease on 6/4.  An MRI of her cervical spine with and without contrast has been ordered but not scheduled.  She notes today that she is having issues with the antibiotics. They are making her  feel nauseous with associated vomiting that has occurred with both the linezolid  and doxycycline . She has completed her linezolid  course but is still taking the doxycycline . She was prescribed Zofran  to help with the nausea but this caused to have palpitations, diaphoresis, and jitters.  She has had success with Phenergan  in the past.  At this time, we will give her a trial of Phenergan  until she is to follow-up with infectious disease next week - Follow-up MR cervical spine with and without contrast - Phenergan  12.5 mg every 6 hours as needed - CRP/ESR -  CBC with differential  Patient discussed with Dr. Bess Broody, MD  [facility] Internal Medicine, PGY-1 Date 09/19/2023 Time 11:32 AM

## 2023-09-19 NOTE — Assessment & Plan Note (Signed)
 CBC on 07/11/2023 notable for hemoglobin of 10.2 with MCV of 62.4.  Of note, she had an iron panel completed and October 2024 notable for an iron of 13 and ferritin of 17.  She has been taking ferrous sulfate  325 mg every other day. She was off of it for about two months but restarted about a month ago. No signs/symptoms today.  - Follow-up CBC - Follow-up iron panel

## 2023-09-20 ENCOUNTER — Ambulatory Visit: Payer: Self-pay | Admitting: Student

## 2023-09-20 LAB — CBC WITH DIFFERENTIAL/PLATELET
Basophils Absolute: 0.1 10*3/uL (ref 0.0–0.2)
Basos: 1 %
EOS (ABSOLUTE): 0.2 10*3/uL (ref 0.0–0.4)
Eos: 2 %
Hematocrit: 39.9 % (ref 34.0–46.6)
Hemoglobin: 11.1 g/dL (ref 11.1–15.9)
Immature Grans (Abs): 0 10*3/uL (ref 0.0–0.1)
Immature Granulocytes: 0 %
Lymphocytes Absolute: 3.1 10*3/uL (ref 0.7–3.1)
Lymphs: 29 %
MCH: 19.8 pg — ABNORMAL LOW (ref 26.6–33.0)
MCHC: 27.8 g/dL — ABNORMAL LOW (ref 31.5–35.7)
MCV: 71 fL — ABNORMAL LOW (ref 79–97)
Monocytes Absolute: 0.7 10*3/uL (ref 0.1–0.9)
Monocytes: 6 %
Neutrophils Absolute: 6.6 10*3/uL (ref 1.4–7.0)
Neutrophils: 62 %
Platelets: 328 10*3/uL (ref 150–450)
RBC: 5.62 x10E6/uL — ABNORMAL HIGH (ref 3.77–5.28)
RDW: 21.3 % — ABNORMAL HIGH (ref 11.7–15.4)
WBC: 10.7 10*3/uL (ref 3.4–10.8)

## 2023-09-20 LAB — IRON,TIBC AND FERRITIN PANEL
Ferritin: 7 ng/mL — ABNORMAL LOW (ref 15–150)
Iron Saturation: 5 % — CL (ref 15–55)
Iron: 21 ug/dL — ABNORMAL LOW (ref 27–159)
Total Iron Binding Capacity: 447 ug/dL (ref 250–450)
UIBC: 426 ug/dL — ABNORMAL HIGH (ref 131–425)

## 2023-09-20 LAB — C-REACTIVE PROTEIN: CRP: 3 mg/L (ref 0–10)

## 2023-09-20 LAB — SEDIMENTATION RATE: Sed Rate: 31 mm/h (ref 0–32)

## 2023-09-20 NOTE — Progress Notes (Signed)
 Attempt #1 to call patient to discuss lab results. No reply. Hemoglobin low-normal but microcytic with low ferritin, iron saturation, and iron. Patient will likely need iron infusion as she remains anemic despite oral treatment. Will attempt to call again tomorrow.

## 2023-09-20 NOTE — Progress Notes (Signed)
 [facility] Medicine Clinic Attending  Case discussed with the resident at the time of the visit.  We reviewed the resident's history and exam and pertinent patient test results.  I agree with the assessment, diagnosis, and plan of care documented in the resident's note.

## 2023-09-21 NOTE — Progress Notes (Signed)
 Attempt #2 to discuss lab results. See previous note for plan. Will attempt again tomorrow. If no reply, will send letter.

## 2023-09-25 ENCOUNTER — Other Ambulatory Visit: Payer: Self-pay | Admitting: Student

## 2023-09-25 DIAGNOSIS — D509 Iron deficiency anemia, unspecified: Secondary | ICD-10-CM

## 2023-09-25 NOTE — Progress Notes (Signed)
 Unable to get a hold of the patient. At this time, will send in referral for iron infusion given her IDA despite oral supplementation.

## 2023-09-26 ENCOUNTER — Telehealth: Payer: Self-pay | Admitting: Internal Medicine

## 2023-09-26 ENCOUNTER — Ambulatory Visit: Payer: Self-pay | Admitting: Internal Medicine

## 2023-09-26 DIAGNOSIS — D509 Iron deficiency anemia, unspecified: Secondary | ICD-10-CM | POA: Insufficient documentation

## 2023-09-26 NOTE — Telephone Encounter (Signed)
 Patient referred to infusion pharmacy team for ambulatory infusion of IV iron.  Insurance - No insurance Dx code - D50.9 IV Iron Therapy - Monoferric 1000 mg IV x 1  Infusion appointments - [facility] Scheduling team will schedule patient as soon as possible.   Sybilla Malhotra D. Selden Noteboom, PharmD

## 2023-09-28 ENCOUNTER — Other Ambulatory Visit (HOSPITAL_COMMUNITY): Payer: Self-pay

## 2023-09-28 ENCOUNTER — Encounter: Payer: Self-pay | Admitting: Pulmonary Disease

## 2023-10-02 ENCOUNTER — Encounter: Payer: Self-pay | Admitting: *Deleted

## 2023-10-10 ENCOUNTER — Other Ambulatory Visit (HOSPITAL_COMMUNITY): Payer: Self-pay

## 2023-10-22 ENCOUNTER — Encounter (HOSPITAL_COMMUNITY): Payer: Self-pay

## 2023-11-05 ENCOUNTER — Encounter (HOSPITAL_COMMUNITY): Payer: Self-pay

## 2023-11-30 ENCOUNTER — Telehealth: Payer: Self-pay

## 2023-11-30 NOTE — Telephone Encounter (Signed)
 Copied from CRM [id number]. Topic: Clinical - Prescription Issue >> Nov 30, 2023 11:39 AM Farrel B wrote: Reason for CRM: Received a call from Ms. Rollene she called from [phone] stating that she had faxed over a patient assistance form for the Monoferric infusions she stated she had faxed the paperwork for the provider to sign off on the she stated the forms were faxed over on 11/23/23 and 11/28/23 and still has not heard back from anyone

## 2023-11-30 NOTE — Telephone Encounter (Signed)
 Not a patient with our office  Forwarding to listed PCP  Lauren Allen

## 2023-12-04 ENCOUNTER — Telehealth: Payer: Self-pay

## 2023-12-04 NOTE — Telephone Encounter (Signed)
 FYI Only or Action Required?: Action required by provider: Calling to get a provider signature for a patient assistance application for a manufacturer a medication to receive treatment at no cost to the patient. She stated she has faxed over the document for signature several times with no response. ( Not sending through admin intentionally due to provider signature being necessary with no further delay).  Patient was last seen in primary care on 09/19/2023 by Stephanie Freund, MD.  Called Nurse Triage reporting No chief complaint on file..  Symptoms began today.  Interventions attempted: Nothing.  Symptoms are: stable.  Triage Disposition: No disposition on file.  Patient/caregiver understands and will follow disposition?:       Copied from CRM [id number]. Topic: Clinical - Medical Advice >> Dec 04, 2023  2:18 PM Zane F wrote: Reason for CRM:   Caller: Lauren Allen  Calling to get a provider signature for a patient assistance application for a manufacturer a medication to receive treatment at no cost to the patient. She stated she has faxed over the document for signature several times with no response. ( Not sending through admin intentionally due to provider signature being necessary with no further delay)  Callback Number: [phone] (direct line)  Fax Number: [phone]   Email: [email or web address]

## 2023-12-04 NOTE — Telephone Encounter (Signed)
 Return call to [facility] with [facility]; no answer. Left message for further information.

## 2023-12-04 NOTE — Telephone Encounter (Signed)
 Forms were received - placed on Camille's desk.

## 2023-12-05 NOTE — Telephone Encounter (Signed)
 Form placed in the Red Team box.

## 2023-12-13 NOTE — Telephone Encounter (Signed)
 Form has been signed by Dr. Celestina and faxed back to Ms. Margaret @ [phone], the transmission was successful.

## 2024-01-23 ENCOUNTER — Encounter (HOSPITAL_BASED_OUTPATIENT_CLINIC_OR_DEPARTMENT_OTHER): Payer: Self-pay

## 2024-01-23 ENCOUNTER — Emergency Department (HOSPITAL_BASED_OUTPATIENT_CLINIC_OR_DEPARTMENT_OTHER): Payer: Self-pay

## 2024-01-23 ENCOUNTER — Emergency Department (HOSPITAL_BASED_OUTPATIENT_CLINIC_OR_DEPARTMENT_OTHER)
Admission: EM | Admit: 2024-01-23 | Discharge: 2024-01-23 | Disposition: A | Discharge status: Home / Self Care | Payer: Self-pay | Attending: Emergency Medicine | Admitting: Emergency Medicine

## 2024-01-23 ENCOUNTER — Other Ambulatory Visit: Payer: Self-pay

## 2024-01-23 DIAGNOSIS — D72829 Elevated white blood cell count, unspecified: Secondary | ICD-10-CM | POA: Insufficient documentation

## 2024-01-23 DIAGNOSIS — G44209 Tension-type headache, unspecified, not intractable: Secondary | ICD-10-CM | POA: Insufficient documentation

## 2024-01-23 LAB — CBC
HCT: 41.4 % (ref 36.0–46.0)
Hemoglobin: 13 g/dL (ref 12.0–15.0)
MCH: 23.5 pg — ABNORMAL LOW (ref 26.0–34.0)
MCHC: 31.4 g/dL (ref 30.0–36.0)
MCV: 74.7 fL — ABNORMAL LOW (ref 80.0–100.0)
Platelets: 306 K/uL (ref 150–400)
RBC: 5.54 MIL/uL — ABNORMAL HIGH (ref 3.87–5.11)
RDW: 19.1 % — ABNORMAL HIGH (ref 11.5–15.5)
WBC: 12 K/uL — ABNORMAL HIGH (ref 4.0–10.5)
nRBC: 0 % (ref 0.0–0.2)

## 2024-01-23 LAB — BASIC METABOLIC PANEL WITH GFR
Anion gap: 13 (ref 5–15)
BUN: 13 mg/dL (ref 6–20)
CO2: 26 mmol/L (ref 22–32)
Calcium: 10.2 mg/dL (ref 8.9–10.3)
Chloride: 99 mmol/L (ref 98–111)
Creatinine, Ser: 0.75 mg/dL (ref 0.44–1.00)
GFR, Estimated: >60 mL/min
Glucose, Bld: 93 mg/dL (ref 70–99)
Potassium: 4.3 mmol/L (ref 3.5–5.1)
Sodium: 137 mmol/L (ref 135–145)

## 2024-01-23 LAB — RESP PANEL BY RT-PCR (RSV, FLU A&B, COVID)  RVPGX2
Influenza A by PCR: NEGATIVE
Influenza B by PCR: NEGATIVE
Resp Syncytial Virus by PCR: NEGATIVE
SARS Coronavirus 2 by RT PCR: NEGATIVE

## 2024-01-23 LAB — LACTIC ACID, PLASMA: Lactic Acid, Venous: 0.7 mmol/L (ref 0.5–1.9)

## 2024-01-23 MED ORDER — KETOROLAC TROMETHAMINE 15 MG/ML IJ SOLN
15.0000 mg | Freq: Once | INTRAMUSCULAR | Status: AC
  Administered 2024-01-23: 15 mg via INTRAVENOUS
  Filled 2024-01-23: qty 1

## 2024-01-23 NOTE — Discharge Instructions (Signed)
 Please use Tylenol  or ibuprofen  for pain.  You may use 600 mg ibuprofen  every 6 hours or 1000 mg of Tylenol  every 6 hours.  You may choose to alternate between the 2.  This would be most effective.  Not to exceed 4 g of Tylenol  within 24 hours.  Not to exceed 3200 mg ibuprofen  24 hours.  Lace of the above you can use your home Excedrin.  Given your history of epidural abscess if you have worsening symptoms, develop fever, chills, numbness, tingling, or your headache does not improve with your home medications I recommend returning to a facility that has an MRI such as [facility] or [facility] for further evaluation to ensure that this is not a complication related to your history of epidural abscess.  Please follow-up closely with your neurosurgeon.

## 2024-01-23 NOTE — ED Provider Notes (Signed)
 Lauren Allen   CSN: [id number] Arrival date & time: 01/23/24  1029     Patient presents with: Headache   Lauren Allen is a 33 y.o. female with past medical history seen for anxiety, depression, PTSD, history of IV drug use who has had previous cervical epidural abscess who presents concern for headache in the back of her head.  She reports this feels quite dissimilar from her previous presentation with epidural abscess.  She reports some improvement with Excedrin, but just wants to make sure that there is nothing else going on.  He reports no numbness, tingling, fever, chills.  She reports no recent drug use.    Headache      Prior to Admission medications   Medication Sig Start Date End Date Taking? Authorizing Provider  ferrous sulfate  325 (65 FE) MG EC tablet Take 1 tablet (325 mg total) by mouth every other day. 03/02/23   Stephanie Freund, MD  hydrOXYzine (ATARAX) 25 MG tablet Take 25-50 mg by mouth 3 (three) times daily as needed. 06/18/23   [provider]  melatonin 5 MG TABS Take 1 tablet (5 mg total) by mouth at bedtime. 02/15/23   Fernand Prost, MD  methadone  (DOLOPHINE ) 10 MG/ML solution Take 165 mg by mouth daily.    [provider]  naloxone  (NARCAN ) 0.4 MG/ML injection Inject 1 mL (0.4 mg total) into the vein as needed for up to 1 dose (If pt is unresponsive or if RR<8). 02/15/23   Fernand Prost, MD  naloxone  (NARCAN ) nasal spray 4 mg/0.1 mL Place 1 spray into the nose as needed, 02/15/23   Fernand Prost, MD  norgestimate -ethinyl estradiol  (SPRINTEC  28) 0.25-35 MG-MCG tablet Take 1 tablet by mouth daily. 08/30/23     promethazine  (PHENERGAN ) 12.5 MG tablet Take 1 tablet (12.5 mg total) by mouth every 6 (six) hours as needed for nausea or vomiting. 09/19/23   Stephanie Freund, MD    Allergies: Sulfa antibiotics    Review of Systems  Neurological:  Positive for headaches.  All other systems  reviewed and are negative.   Updated Vital Signs BP 127/78   Pulse 74   Temp 98.1 F (36.7 C)   Resp 20   Ht 5' 3 (1.6 m)   Wt 91.2 kg   LMP 01/02/2024 (Exact Date)   SpO2 100%   BMI 35.61 kg/m   Physical Exam Vitals and nursing Allen reviewed.  Constitutional:      General: She is not in acute distress.    Appearance: Normal appearance.  HENT:     Head: Normocephalic and atraumatic.  Eyes:     General:        Right eye: No discharge.        Left eye: No discharge.  Neck:     Comments: No tenderness to palpation throughout the cervical spine, no neck rigidity.  Normal range of motion throughout.  No swelling of the posterior cervical spinal area.  No redness. Cardiovascular:     Rate and Rhythm: Normal rate and regular rhythm.     Heart sounds: No murmur heard.    No friction rub. No gallop.  Pulmonary:     Effort: Pulmonary effort is normal.     Breath sounds: Normal breath sounds.  Abdominal:     General: Bowel sounds are normal.     Palpations: Abdomen is soft.  Musculoskeletal:     Comments: No significant tenderness palpation of thoracic and lumbar  spine.  Normal strength, range of motion of bilateral upper and lower extremities.  Skin:    General: Skin is warm and dry.     Capillary Refill: Capillary refill takes less than 2 seconds.     Comments: Chronic wounds, and scarring of bilateral forearms, with no evidence of secondary infection.  Neurological:     Mental Status: She is alert and oriented to person, place, and time.     Comments: Moves all 4 limbs spontaneously, CN II through XII grossly intact, can ambulate without difficulty, intact sensation throughout.   Psychiatric:        Mood and Affect: Mood normal.        Behavior: Behavior normal.     (all labs ordered are listed, but only abnormal results are displayed) Labs Reviewed  CBC - Abnormal; Notable for the following components:      Result Value   WBC 12.0 (*)    RBC 5.54 (*)    MCV 74.7  (*)    MCH 23.5 (*)    RDW 19.1 (*)    All other components within normal limits  RESP PANEL BY RT-PCR (RSV, FLU A&B, COVID)  RVPGX2  BASIC METABOLIC PANEL WITH GFR  LACTIC ACID, PLASMA    EKG: None  Radiology: CT Head Wo Contrast Result Date: 01/23/2024 CLINICAL DATA:  Severe sudden headache. EXAM: CT HEAD WITHOUT CONTRAST TECHNIQUE: Contiguous axial images were obtained from the base of the skull through the vertex without intravenous contrast. RADIATION DOSE REDUCTION: This exam was performed according to the departmental dose-optimization program which includes automated exposure control, adjustment of the mA and/or kV according to patient size and/or use of iterative reconstruction technique. COMPARISON:  03/27/2015 FINDINGS: Brain: No evidence of acute infarction, hemorrhage, hydrocephalus, extra-axial collection or mass lesion/mass effect. Vascular: No hyperdense vessel or unexpected calcification. Skull: Normal. Negative for fracture or focal lesion. Sinuses/Orbits: Orbits are normal. Hypoplastic frontal sinuses. Visualized paranasal sinuses are otherwise unremarkable. Other: None. IMPRESSION: No acute findings. Electronically Signed   By: Toribio Agreste M.D.   On: 01/23/2024 11:20     Procedures   Medications Ordered in the ED  ketorolac  (TORADOL ) 15 MG/ML injection 15 mg (15 mg Intravenous Given 01/23/24 1121)                                    Medical Decision Making Amount and/or Complexity of Data Reviewed Labs: ordered. Radiology: ordered.  Risk Prescription drug management.   This patient is a 33 y.o. female  who presents to the ED for concern of headache.   Differential diagnoses prior to evaluation: The emergent differential diagnosis includes, but is not limited to,  Stroke, increased ICP, meningitis, CVA, intracranial tumor, venous sinus thrombosis, migraine, cluster headache, hypertension, drug related, head injury, tension headache, sinusitis, dental abscess,  otitis media, TMJ, --given her history some concern for complications related to her previous epidural abscess, versus other infectious etiology.. This is not an exhaustive differential.   Past Medical History / Co-morbidities / Social History: anxiety, depression, PTSD, history of IV drug use who has had previous cervical epidural abscess  Additional history: Chart reviewed. Pertinent results include: Infectious disease, internal medicine visits, previous ED and hospitalizations for epidural abscess  Physical Exam: Physical exam performed. The pertinent findings include:  No tenderness to palpation throughout the cervical spine, no neck rigidity.  Normal range of motion throughout.  No swelling of  the posterior cervical spinal area.  No redness. No significant tenderness palpation of thoracic and lumbar spine.  Normal strength, range of motion of bilateral upper and lower extremities.  Chronic wounds, and scarring of bilateral forearms, with no evidence of secondary infection.    Moves all 4 limbs spontaneously, CN II through XII grossly intact, can ambulate without difficulty, intact sensation throughout. Vital signs stable in the emergency department, mild diastolic hypertension on arrival, blood pressure 128/99.  She has been afebrile, no tachycardia.  Lab Tests/Imaging studies: I personally interpreted labs/imaging and the pertinent results include: BMP unremarkable, RVP negative for COVID, flu, RSV, normal lactic acid, CT head without contrast with no intracranial abnormality.  Her CBC is notable for very mild leukocytosis, blood cells 12.,  Otherwise overall unremarkable.  Mild microcytic quality of red blood cells without anemia. I agree with the radiologist interpretation.   Medications: I ordered medication including Toradol  for pain.  I have reviewed the patients home medicines and have made adjustments as needed.   Disposition: After consideration of the diagnostic results and the  patients response to treatment, I feel that patient with total resolution of her symptoms, overall clinically her symptoms do seem consistent with a tension type headache.  I discussed with the patient that given her history of epidural abscess that an extensive ED workup may involve repeat MRI imaging of her neuro axis, however given that her symptoms feel dissimilar to her previous.  We had an extensive decision-making conversation about possible transfer for further evaluation versus discharge with close follow-up/return precautions given her history.  Patient understands and agrees, given clinically she is feeling 100% improved with Toradol  I think reasonable for discharge at this time, patient understands return precautions  emergency department workup does not suggest an emergent condition requiring admission or immediate intervention beyond what has been performed at this time. The plan is: as above. The patient is safe for discharge and has been instructed to return immediately for worsening symptoms, change in symptoms or any other concerns.   Final diagnoses:  Acute non intractable tension-type headache    ED Discharge Orders     None          Rosan Sherlean DEL, [place] 01/23/24 1245    Freddi Hamilton, MD 01/23/24 1433

## 2024-01-23 NOTE — ED Notes (Signed)
 Pt d/c instructions, medications, and follow-up care reviewed with pt. Pt verbalized understanding and had no further questions at time of d/c. Pt CA&Ox4, ambulatory, and in NAD at time of d/c

## 2024-01-23 NOTE — ED Triage Notes (Signed)
 Pain to posterior base of head. PMH of epidural abscesses, anxiety, and PTSD.

## 2024-01-24 ENCOUNTER — Telehealth: Payer: Self-pay | Admitting: *Deleted

## 2024-01-24 NOTE — Telephone Encounter (Signed)
 Please refer to message below.  Thanks,  [place]  ----- Message -----  From: Riesgo, Joslyn Taylor  Sent: 01/23/2024   7:18 AM EDT  To: Melda Fearing  Subject: Appointment Request                              Appointment Request From: Aleck Waddell Hash    With Provider: Fairy [facility] Of [facility]]    Preferred Date Range: 01/23/2024 - 01/30/2024    Preferred Times: Any    Reason for visit: Office Visit    Health Maintenance Topic:    Comments:  I've been having a weird kind of pressure pain in the back of my head for very the last 5 days. It's very mild and I have had a cold that has caused a lot of pressure in my head but with my history, I wanted to just be checked out by a doctor

## 2024-01-24 NOTE — Telephone Encounter (Signed)
 Attempt #2 unable to reach the patient.

## 2024-01-24 NOTE — Telephone Encounter (Signed)
 I called the patient to schedule a appointment. I was unable to reach the patient, I lvm for her to give us  a call back.

## 2024-01-24 NOTE — Telephone Encounter (Signed)
 Per chart, pt went to the ED yesterday 10/1.

## 2024-01-24 NOTE — Telephone Encounter (Signed)
 Can you schedule pt a f/u visit appt? Thanks

## 2024-01-25 NOTE — Telephone Encounter (Signed)
Attempt 3, lvm.

## 2024-02-25 ENCOUNTER — Encounter: Payer: Self-pay | Admitting: Radiology
